# Patient Record
Sex: Female | Born: 1935
Health system: Southern US, Community
[De-identification: ages and names within clinical notes are randomized; demographics above are authoritative.]

## PROBLEM LIST (undated history)

## (undated) DIAGNOSIS — I2510 Atherosclerotic heart disease of native coronary artery without angina pectoris: Secondary | ICD-10-CM

## (undated) DIAGNOSIS — F0280 Dementia in other diseases classified elsewhere without behavioral disturbance: Secondary | ICD-10-CM

## (undated) DIAGNOSIS — F0390 Unspecified dementia without behavioral disturbance: Secondary | ICD-10-CM

## (undated) DIAGNOSIS — I10 Essential (primary) hypertension: Secondary | ICD-10-CM

## (undated) DIAGNOSIS — E785 Hyperlipidemia, unspecified: Secondary | ICD-10-CM

## (undated) DIAGNOSIS — I4891 Unspecified atrial fibrillation: Secondary | ICD-10-CM

## (undated) HISTORY — DX: Essential (primary) hypertension: I10

---

## 2002-02-10 ENCOUNTER — Inpatient Hospital Stay (HOSPITAL_COMMUNITY): Admission: EM | Admit: 2002-02-10 | Discharge: 2002-02-13 | Payer: Self-pay | Admitting: *Deleted

## 2002-02-10 ENCOUNTER — Encounter: Payer: Self-pay | Admitting: *Deleted

## 2003-06-24 ENCOUNTER — Ambulatory Visit (HOSPITAL_COMMUNITY): Admission: RE | Admit: 2003-06-24 | Discharge: 2003-06-24 | Payer: Self-pay | Admitting: *Deleted

## 2007-05-03 ENCOUNTER — Ambulatory Visit (HOSPITAL_COMMUNITY): Admission: RE | Admit: 2007-05-03 | Discharge: 2007-05-03 | Payer: Self-pay | Admitting: Pulmonary Disease

## 2008-08-10 ENCOUNTER — Encounter: Payer: Self-pay | Admitting: Cardiovascular Disease

## 2008-08-10 ENCOUNTER — Ambulatory Visit (HOSPITAL_COMMUNITY): Admission: RE | Admit: 2008-08-10 | Discharge: 2008-08-10 | Payer: Self-pay | Admitting: Cardiovascular Disease

## 2010-12-20 NOTE — Procedures (Signed)
NAMEKATHYANN, Amy Randall             ACCOUNT NO.:  [id number]   MEDICAL RECORD NO.:  [id number]          PATIENT TYPE:  OUT   LOCATION:  RAD                           FACILITY:  [facility]   PHYSICIAN:  Darlin Priestly, MD  DATE OF BIRTH:  1934-05-01   DATE OF PROCEDURE:  05/03/2007  DATE OF DISCHARGE:                                ECHOCARDIOGRAM   Amy Randall is a 77 year old female patient of [place], Dr. Kem Boroughs, with a history of palpitations and hypertension, is now  referred for a 2-D echocardiogram, evaluate LV function and valvular  structures.   The aorta is within normal limits at 3.2 cm.   Left atrium is enlarged 4.5 cm.  There were no clots seen.  The patient  in sinus rhythm during the procedure.   IVUS and MAPW are mildly thickened at 1.2 and 1.8 cm respectively.   The aortic valve is mildly thickened with no significant aortic stenosis  or regurgitation.   Mitral valve leaflets are mildly thickened with mild to moderate mitral  regurgitation.  There is no significant mitral stenosis.   Structurally normal tricuspid valve with mild tricuspid regurg.   Trivial pulmonic regurgitation.   Left ventricular internal dimensions within normal limits at 4.2 and 3.0  cm respectfully.  There is good overall left ventricular function,  estimated EF of 60% with no segmental wall motion abnormalities  visualized.   Normal RV size and systolic function.   Mild right atrial enlargement.   CONCLUSIONS:  1. Normal LV size and systolic function, estimated EF of 60%.  2. Mildly thickened aortic valve with no significant aortic stenosis      or regurgitation.  3. Mildly thickened mitral valve leaflets with mild to moderate mitral      regurg and no significant mitral stenosis.  4. Structurally normal tricuspid valve with mild tricuspid      regurgitation.  5. Trivial pulmonic regurgitation.  6. Biatrial enlargement.  7. Normal RV size and systolic function.      Darlin Priestly, MD  Electronically Signed     RHM/MEDQ  D:  05/03/2007  T:  05/04/2007  Job:  [id number]   cc:   Ramon Dredge L. Juanetta Gosling, M.D.  Fax: [phone]   Dani Gobble, MD  Fax: [phone]

## 2010-12-23 NOTE — H&P (Signed)
[facility]  Patient:    Amy Randall, Amy Randall Visit Number: [id number] MRN: [id number]          Service Type: MED Location: 2A A210 01 Attending Physician:  Marcene Corning Dictated by:   Marcene Corning, M.D. Admit Date:  02/10/2002                           History and Physical  ATTENDING PHYSICIAN NOT KNOWN.  CHIEF COMPLAINT:  "I woke up this morning with rapid heart rate and I didnt feel well."  HISTORY OF PRESENT ILLNESS:  The patient is a 77 year old white female with no significant past medical history.  She is not on any medications at this time and does not have any primary care M.D.  The patient states she awoke this morning with rapid heart rate.  She states she just "didnt feel well and could feel her heart racing."  She was brought to the emergency room for evaluation.  She denies any shortness of breath. She denies any chest pain, tightness, or pressure.  There is no dizziness or near syncope.  In the ER, the patient was noted to be in rapid atrial fibrillation with heart rate in the 160s initially.  She was also noted to be hypertensive with systolic pressures in the 180s and diastolics in the low 100s.  She was treated initially with adenosine which did not work.  She was then placed on Cardizem bolus and drip.  She had subsequently converted to a normal sinus rhythm at a rate of around 100-110.  She was noted to have a profoundly low potassium at 2.7 and with this elevated blood pressure this is worrisome for possible primary aldosteronism as she is not on any diuretics.  Because of her above symptoms and laboratory abnormalities and rapid atrial fibrillation it was felt that admission was indicated.  REVIEW OF SYSTEMS:  The patient has headaches occasionally at home controlled with over-the-counter medications.  No recent change in her vision or hearing. No sore mouth or sore throat.  No complaints of any chest pain, tightness, or  pressure.  No shortness of breath or wheezing.  No abdominal pain.  She has occasional constipation, no diarrhea, no UTI symptoms, no hematuria, dysuria, melena or bright red blood per rectum.  No complaints of pain in her extremities or joints.  No complaints of lower extremity edema.  No fever, chills, nausea, vomiting, or weight loss.  The remainder of her review of systems is negative.  PAST MEDICAL HISTORY:  None.  PAST SURGICAL HISTORY:  None.  ALLERGIES:  QUESTIONABLE PYRIDIUM.  MEDICATIONS:  None.  FAMILY HISTORY:  Positive for mother with rheumatic fever and mitral valve prolapse.  No cancer.  Father had stroke.  Her grandmother and aunt had diabetes.  SOCIAL HISTORY:  The patient is married, she has two children, she does not work.  She has no history of cigarette use/abuse or alcohol abuse or other drug abuse.  PHYSICAL EXAMINATION:  VITAL SIGNS:  In the emergency room, she had vitals that showed systolic blood pressures in the 180s-190s and diastolic blood pressures in the 90s to low 100s.  She was afebrile, her heart rate was in the 160s, respirations were 20.  GENERAL:  She was an elderly-appearing white female lying at 45 degrees in no apparent distress.  HEENT:  Nontraumatic, normocephalic.  Pupils equal, round, reactive to light. Conjunctivae are clear.  Sclerae are nonicteric.  Oropharynx is  clear.  Mucous membranes are moist.  NECK:  Supple.  No lymphadenopathy, no thyromegaly, no JVD, no bruits.  LUNGS:  Clear to auscultation bilaterally.  HEART:  Regular rate and rhythm with a 1-2/6 systolic murmur.  ABDOMEN:  Soft and nontender, nondistended, positive bowel sounds.  No masses.  GENITAL:  Not done.  EXTREMITIES:  Full range of motion with no significant edema.  NEUROLOGIC:  She was alert and oriented x3.  Her cranial nerves II-XII were grossly intact.  Sensory and motor was grossly intact in the upper and lower extremities bilaterally with no  asymmetry noted.  Cerebellar exam was not tested.  LABORATORIES:  Her PT and PTT were normal.  CPK was 33 with a troponin 0.04. CBC showed a white blood cell count of 12.2, hemoglobin 14.2, platelets of 245,000.  Chest x-ray showed cardiomegaly with questionable early versus mild failure. There was no effusions or other infiltrates.  Chemistry panel showed potassium low at 2.7, sodium 137, chloride 103, bicarb 24, blood sugar elevated at 168, BUN 9, creatinine 0.7.  Her alkaline phosphatase is 128, her total bilirubin is slightly elevated at 1.3.  Her AST was 45, ALT was 55.  The patients EKG initially showed rapid atrial fibrillation with ST depression in leads V3-V6 up to 2-3 mm.  She also had some ST depression and T wave inversion in her inferior leads.  She had decrease in her anterior R forces in V1 and V2.  EKG normal sinus rhythm shows resolution of the ST changes but continued T wave flattening and inversion in her inferior leads. She still has the decreased anterior R forces in V1 and V2.  PROBLEM LIST: 1. Rapid atrial fibrillation which has now converted to normal sinus rhythm on    Cardizem.  EKG showing what looks like ischemic changes inferiorly and    laterally and septal leads and lateral leads.  Cardiac enzymes normal at    this time. 2. Hypertension with blood pressure very elevated in the emergency room as    noted above now improved on Cardizem drip. 3. Leukocytosis.  White cell count very slightly elevated at 12.2 but no    evidence clinically of infection. 4. Questionable congestive heart failure on chest x-ray.  Saturations in the    mid to high 90s now.  She does have what looks like cardiomegaly on chest    x-ray per radiology. 5. Hypokalemia with potassium of 2.7 and with this blood pressure and no    diuretic use this may be worrisome for primary hyperaldosteronism. 6. A 1-2/6 systolic murmur. 7. Hyperglycemia with a blood sugar 168.  No history of  diabetes. 8. Slightly elevated liver function tests questionable secondary to decreased     hepatic blood flow during her episode of rapid atrial fibrillation.  PLAN:  I will admit her to the ICU and try to wean her off this Cardizem drip. I will treat her with Lopressor 50 mg p.o. q.8h. and use p.r.n. clonidine as needed.  I will check a TSH level for her atrial fibrillation and check a magnesium level for this low potassium and replace that if needed.  I will replace her potassium p.o. for now.  She will need a 2-D echocardiogram to assess her wall motion ejection fraction and valve with this 1-2/6 systolic murmur.  She will need a nuclear medicine stress test before discharge and I will go ahead and schedule that for tomorrow.  Would ask cardiology to see her today.  I  will place her low salt diet and replace her potassium p.o.  I will consider workup for primary hyperaldosteronism either as an inpatient or an outpatient before discharge.  I would consider abdominal CT to look at her adrenal glands and consider renin levels and aldosterone levels.  I will recheck her liver function tests in the morning to see if they are improved and if not will consider hepatitis screens and GI consult.  For her hyperglycemia, I will do Accu-Cheks and consider a hemoglobin A1C if they are consistently elevated and workup for diabetes. Dictated by:   Marcene Corning, M.D. Attending Physician:  Marcene Corning DD:  02/10/02 TD:  02/12/02 Job: [id number]

## 2010-12-23 NOTE — Procedures (Signed)
[facility]  Patient:    Amy Randall, WIDEMAN Visit Number: [id number] MRN: [id number]          Service Type: MED Location: 2A A210 01 Attending Physician:  Marcene Corning Dictated by:   [name], M.D. Proc. Date: 02/10/02 Admit Date:  02/10/2002 Discharge Date: 02/13/2002                            EKG Interpretations  TIME/DATE:  2841, February 10, 2002  DESCRIPTION:  The rhythm is what appears to be a supraventricular tachycardia, probably atrial fibrillation, with a ventricular response around 160-170. There are ST and T-wave changes which may be due to ischemia or may be related to the rate.  There is evidence of left ventricular hypertrophy.  IMPRESSION:  Abnormal electrocardiogram. Dictated by:   [name], M.D. Attending Physician:  Marcene Corning DD:  02/20/02 TD:  02/26/02 Job: [id number]

## 2010-12-23 NOTE — Discharge Summary (Signed)
[facility]  Patient:    Amy Randall, Amy Randall Visit Number: [id number] MRN: [id number]          Service Type: MED Location: 2A A210 01 Attending Physician:  Marcene Corning Dictated by:   Marcene Corning, M.D. Admit Date:  02/10/2002 Discharge Date: 02/13/2002   CC:         [name], M.D.  Sherral Hammers, M.D.   Discharge Summary  CONSULTATIONS THIS ADMISSION:  [facility] with Dr. Domingo Sep and colleagues.  DISCHARGE DIAGNOSES: 1. Rapid atrial fibrillation, converted to normal sinus rhythm at this time,    discharged on an 81 mg aspirin per day. Work-up included TSH and    echocardiogram was normal.  For follow up stress test as an outpatient. 2. Hypertension with blood pressure severely elevated on admission with    systolics greater than 200, and diastolics above 100.  Now much improved on    medications as noted below.  Discharged on a low salt diet and medications    as noted below. 3. Mild CHF, noted on admission, probably secondary to atrial fibrillation    with 2-D echocardiogram showing normal left ventricular and right    ventricular function and no significant valvular abnormalities. 4. Hypokalemia, which is severe on admission at 2.7.  With her elevated blood    pressures and hypokalemia on no diuretics, work-up for primary aldosterone    was done with renin levels which should be back next week.  Her urine    sodium was 22, her urine potassium 22. 5. Anxiety, well-controlled, on Paxil 20 mg per day p.r.n. and Xanax 0.25 mg    q.8h. 6. Hypercholesterolemia.  Discharged on Zocor. 7. Mild pleural effusions resolved by the day of discharge.  Her room air    saturations have been in the high 90s. 8. Mild elevation of liver function tests on admission, resolved during this    hospitalization by the next day, possibly secondary to decreased hepatic    blood flow during the atrial fibrillation.  DISCHARGE MEDICATIONS: 1.  Cardizem CD 300 mg p.o. q. day. 2. Lopressor 50 mg every 8 hours.  I have asked her to take them at 7 a.m., 3    p.m. and 11 p.m. 3. 81 mg enteric-coated aspirin per day with food. 4. Altace 5 mg per day. 5. Paxil 20 mg per day. 6. Zocor 40 mg per day. 7. Xanax 0.25 mg every 8 hours as needed.  FOLLOWUP:  She will follow up with Dr. Domingo Sep as directed and they will call her office tomorrow for an appointment.  She will follow up with Dr. Juanetta Gosling as directed and he will become her primary care physician.  His office is to call her tomorrow for an appointment.  DIET:  Low salt, 2-3 gram sodium.  ACTIVITY:  As tolerated.  FOLLOWUP:  She is to return to the emergency room or call her cardiologists office for episodes of rapid heart beat or shortness of breath, dizziness, or chest pain.  HISTORY AND PHYSICAL:  Please refer to the dictated history and physical.  LABORATORY DATA AND X-RAYS:  The patient had a 2-D echocardiogram which showed mild tricuspid regurgitation but the rest of her valves were normal and her ejection fraction was normal.  Her magnesium level was 2.0 during this hospitalization.  Her last chemistry panel showed a sodium 141, potassium 3.8, chloride 105, bicarbonate 28.  Her BUN was 20, creatinine 1.0, calcium normal at 9.0.  Admission white cell count  was 12.2, but repeat was 9.3.  Her hemoglobin was 12.0 last check and her platelets were 213,000 last check.  Her PT and PTT were normal on admission.  Liver function tests was slightly elevated on admission with an AST of 45, ALT 55, alk. phos. 128, and total bilirubin 1.3.  On repeat check they were all normal with AST of 23, ALT 36, alk. phos. 101, and total bilirubin 0.6.   Her hemoglobin A1c was 4.9 and normal.  She ruled out for myocardial infarction by enzymes.  Lipid profile showed a total cholesterol of 253, with a LDL of 153, and a HDL of 74. Triglycerides were 132, and normal.  Urine sodium was 22, urine  potassium 22, TSH was 1.48.  Chest x-ray showed mild CHF on admission and cardiomegaly.  EKG on admission showed rapid atrial fibrillation with ST depression in her anterior and lateral leads.  She also had some mild ST depression in her inferior leads.  Repeat EKG showed normal sinus rhythm and resolution of her ST depressions.  She still had T wave inversions in 3 and aVF and V3.  HOSPITAL COURSE: #1 - ATRIAL FIBRILLATION:  On admission the patient was in rapid atrial fibrillation but converted with Cardizem IV.  She was admitted to the ICU and we were able to wean her Cardizem drip to p.o. Cardizem.  We also added beta blocker as noted above.  Her heart rain remained in sinus rhythm throughout the remainder of the hospitalization.  Cardiology consultation was obtained a 2-D echocardiogram showed only minimal tricuspid regurgitation but no other significant valvular abnormalities and her ejection fraction was normal.  We felt that she needed stress testing to rule out coronary artery disease as an etiology and this will be done as outpatient via Dr. Ledora Bottcher office.  Her TSH was normal during this hospitalization.  #2 - HYPERTENSION:  Her blood pressure was very elevated in the emergency room with systolics at times above 200s and diastolic greater than 100.  It was improved on a Cardizem drip and we were able to change her to Cardizem CD 300 mg per day along with Altace 500 mg per day and Lopressor 50 mg every 8 hours. We may be able to change her beta blocker to a once a day dose as an outpatient but I will leave that up to cardiology.  On this regimen  her systolics were ranging between 140 and 170 with diastolics less than 100.  I have explained to her that we cannot drop her pressure to low, to quick, and her medications can be adjusted as an outpatient.  I went over a low salt diet with her and her daughter.   #3 - ANXIETY:  This is well controlled on Paxil and Xanax and  she will be discharged on that as an outpatient.  #4 - LEUKOCYTOSIS:  I saw no evidence of any infection during this hospitalization.  She was not discharged on any antibiotics.  She did receive 1 dose of antibiotic Levaquin during this hospitalization but that quickly discontinued.  #5 - CONGESTIVE HEART FAILURE:  This was noted on admission and in retrospect it was probably secondary to altered rapid atrial fibrillation as her 2-D echocardiogram showed normal left ventricular and right ventricular functions.  #6 - HYPOKALEMIA:  This was noted on admission at 2.7 and she is not on any diuretics.  With this very high blood pressure I sent a renin level to rule out any primary hyperaldosteronism.  Her  urine lytes were as noted above. This can be followed up as outpatient via Dr. Juanetta Gosling office.  #7 - HYPERGLYCEMIA:  Her blood sugar on admission was 168 but Accu-Cheks during this hospitalization were normal and her hemoglobin A1c was normal and I felt this was probably secondary to stress related to her atrial fibrillation.  No further work-up was indicated at this time.  #8 - SLIGHTLY ELEVATED LIVER FUNCTION TESTS:  Again, on admission these were elevated but returned to normal by the next day.  I felt this was probably secondary to slightly decreased hepatic blood flow during her episode of rapid atrial fibrillation and did not feel any further work-up was indicated at this time. Dictated by:   Marcene Corning, M.D. Attending Physician:  Marcene Corning DD:  02/13/02 TD:  02/17/02 Job: [id number]

## 2010-12-23 NOTE — Procedures (Signed)
[facility]  Patient:    Amy Randall, Amy Randall Visit Number: [id number] MRN: [id number]          Service Type: MED Location: 2A A210 01 Attending Physician:  Marcene Corning Dictated by:   [name], M.D. Proc. Date: 02/10/02 Admit Date:  02/10/2002 Discharge Date: 02/13/2002                            EKG Interpretations  TIME/DATE:  1144, February 10, 2002  DESCRIPTION:  The rhythm is a sinus tachycardia with a rate about 105.  The previously noted ST and T-wave changes are much less marked, and her left ventricular hypertrophy is less marked than before.  IMPRESSION:  Abnormal electrocardiogram. Dictated by:   [name], M.D. Attending Physician:  Marcene Corning DD:  02/20/02 TD:  02/26/02 Job: [id number]

## 2012-01-10 DIAGNOSIS — I10 Essential (primary) hypertension: Secondary | ICD-10-CM | POA: Diagnosis not present

## 2012-01-10 DIAGNOSIS — I951 Orthostatic hypotension: Secondary | ICD-10-CM | POA: Diagnosis not present

## 2012-01-10 DIAGNOSIS — E785 Hyperlipidemia, unspecified: Secondary | ICD-10-CM | POA: Diagnosis not present

## 2012-04-03 DIAGNOSIS — I4891 Unspecified atrial fibrillation: Secondary | ICD-10-CM | POA: Diagnosis not present

## 2012-04-03 DIAGNOSIS — I10 Essential (primary) hypertension: Secondary | ICD-10-CM | POA: Diagnosis not present

## 2012-04-29 DIAGNOSIS — R21 Rash and other nonspecific skin eruption: Secondary | ICD-10-CM | POA: Diagnosis not present

## 2012-07-10 DIAGNOSIS — I10 Essential (primary) hypertension: Secondary | ICD-10-CM | POA: Diagnosis not present

## 2012-07-10 DIAGNOSIS — E785 Hyperlipidemia, unspecified: Secondary | ICD-10-CM | POA: Diagnosis not present

## 2012-07-10 DIAGNOSIS — M1990 Unspecified osteoarthritis, unspecified site: Secondary | ICD-10-CM | POA: Diagnosis not present

## 2012-07-10 DIAGNOSIS — Z23 Encounter for immunization: Secondary | ICD-10-CM | POA: Diagnosis not present

## 2012-07-22 DIAGNOSIS — I10 Essential (primary) hypertension: Secondary | ICD-10-CM | POA: Diagnosis not present

## 2012-07-22 DIAGNOSIS — E785 Hyperlipidemia, unspecified: Secondary | ICD-10-CM | POA: Diagnosis not present

## 2013-01-01 DIAGNOSIS — I10 Essential (primary) hypertension: Secondary | ICD-10-CM | POA: Diagnosis not present

## 2013-01-01 DIAGNOSIS — E785 Hyperlipidemia, unspecified: Secondary | ICD-10-CM | POA: Diagnosis not present

## 2013-01-01 DIAGNOSIS — M1990 Unspecified osteoarthritis, unspecified site: Secondary | ICD-10-CM | POA: Diagnosis not present

## 2013-05-23 DIAGNOSIS — Z23 Encounter for immunization: Secondary | ICD-10-CM | POA: Diagnosis not present

## 2013-08-13 ENCOUNTER — Other Ambulatory Visit: Payer: Self-pay | Admitting: *Deleted

## 2013-08-15 ENCOUNTER — Other Ambulatory Visit: Payer: Self-pay | Admitting: *Deleted

## 2013-08-15 MED ORDER — METOPROLOL TARTRATE 25 MG PO TABS
12.5000 mg | ORAL_TABLET | Freq: Two times a day (BID) | ORAL | Status: DC
Start: 2013-08-15 — End: 2013-10-14

## 2013-08-15 NOTE — Telephone Encounter (Signed)
Rx was sent to pharmacy electronically. 

## 2013-08-19 ENCOUNTER — Ambulatory Visit: Payer: Self-pay | Admitting: Cardiovascular Disease

## 2013-09-01 DIAGNOSIS — I259 Chronic ischemic heart disease, unspecified: Secondary | ICD-10-CM | POA: Diagnosis not present

## 2013-09-01 DIAGNOSIS — I10 Essential (primary) hypertension: Secondary | ICD-10-CM | POA: Diagnosis not present

## 2013-09-01 DIAGNOSIS — E785 Hyperlipidemia, unspecified: Secondary | ICD-10-CM | POA: Diagnosis not present

## 2013-09-01 DIAGNOSIS — M1990 Unspecified osteoarthritis, unspecified site: Secondary | ICD-10-CM | POA: Diagnosis not present

## 2013-09-10 ENCOUNTER — Ambulatory Visit: Payer: Self-pay | Admitting: Cardiovascular Disease

## 2013-10-02 ENCOUNTER — Ambulatory Visit: Payer: Self-pay | Admitting: Cardiovascular Disease

## 2013-10-07 ENCOUNTER — Other Ambulatory Visit: Payer: Self-pay

## 2013-10-07 MED ORDER — DILTIAZEM HCL ER COATED BEADS 300 MG PO CP24: 300.0000 mg | ORAL_CAPSULE | Freq: Every day | ORAL | Status: DC

## 2013-10-07 NOTE — Telephone Encounter (Signed)
Rx was sent to pharmacy electronically. 

## 2013-10-14 ENCOUNTER — Other Ambulatory Visit: Payer: Self-pay

## 2013-10-14 MED ORDER — METOPROLOL TARTRATE 25 MG PO TABS: 12.5000 mg | ORAL_TABLET | Freq: Two times a day (BID) | ORAL | Status: DC

## 2013-10-14 NOTE — Telephone Encounter (Signed)
Rx was sent to pharmacy electronically. 

## 2013-11-06 ENCOUNTER — Other Ambulatory Visit: Payer: Self-pay

## 2013-11-06 MED ORDER — LISINOPRIL 20 MG PO TABS: 20.0000 mg | ORAL_TABLET | Freq: Every day | ORAL | Status: DC

## 2013-11-06 NOTE — Telephone Encounter (Signed)
Patient hasn't been seen since 03/2012 and has canceled several scheduled appointments. A 30-day supply was sent into pharmacy. Patient MUST/KEEP an appointment for future refills.

## 2013-12-08 DIAGNOSIS — I10 Essential (primary) hypertension: Secondary | ICD-10-CM | POA: Diagnosis not present

## 2013-12-08 DIAGNOSIS — I259 Chronic ischemic heart disease, unspecified: Secondary | ICD-10-CM | POA: Diagnosis not present

## 2013-12-08 DIAGNOSIS — M1990 Unspecified osteoarthritis, unspecified site: Secondary | ICD-10-CM | POA: Diagnosis not present

## 2013-12-08 DIAGNOSIS — E785 Hyperlipidemia, unspecified: Secondary | ICD-10-CM | POA: Diagnosis not present

## 2013-12-10 ENCOUNTER — Other Ambulatory Visit: Payer: Self-pay | Admitting: Cardiovascular Disease

## 2013-12-10 NOTE — Telephone Encounter (Signed)
Rx was sent to pharmacy electronically. PATIENT NEEDS TO CALL AND SCHEDULE AN APPOINTMENT FOR FUTURE REFILLS.

## 2013-12-11 DIAGNOSIS — F329 Major depressive disorder, single episode, unspecified: Secondary | ICD-10-CM | POA: Diagnosis not present

## 2013-12-11 DIAGNOSIS — F3289 Other specified depressive episodes: Secondary | ICD-10-CM | POA: Diagnosis not present

## 2013-12-11 DIAGNOSIS — E785 Hyperlipidemia, unspecified: Secondary | ICD-10-CM | POA: Diagnosis not present

## 2013-12-11 DIAGNOSIS — M1990 Unspecified osteoarthritis, unspecified site: Secondary | ICD-10-CM | POA: Diagnosis not present

## 2013-12-11 DIAGNOSIS — I10 Essential (primary) hypertension: Secondary | ICD-10-CM | POA: Diagnosis not present

## 2014-02-25 ENCOUNTER — Other Ambulatory Visit: Payer: Self-pay | Admitting: Cardiovascular Disease

## 2014-02-25 NOTE — Telephone Encounter (Signed)
Patient not seen since 2013, spoke with patient about needing appointment for further refills. Patient stated she was going to get all medications from PCP and did not need to come back here.

## 2014-02-25 NOTE — Telephone Encounter (Signed)
Rx refill denied to patient pharmacy   

## 2014-06-11 DIAGNOSIS — I10 Essential (primary) hypertension: Secondary | ICD-10-CM | POA: Diagnosis not present

## 2014-06-11 DIAGNOSIS — E785 Hyperlipidemia, unspecified: Secondary | ICD-10-CM | POA: Diagnosis not present

## 2014-06-11 DIAGNOSIS — I25810 Atherosclerosis of coronary artery bypass graft(s) without angina pectoris: Secondary | ICD-10-CM | POA: Diagnosis not present

## 2014-06-11 DIAGNOSIS — Z23 Encounter for immunization: Secondary | ICD-10-CM | POA: Diagnosis not present

## 2014-06-17 DIAGNOSIS — Z79899 Other long term (current) drug therapy: Secondary | ICD-10-CM | POA: Diagnosis not present

## 2014-06-17 DIAGNOSIS — E785 Hyperlipidemia, unspecified: Secondary | ICD-10-CM | POA: Diagnosis not present

## 2014-12-09 DIAGNOSIS — E785 Hyperlipidemia, unspecified: Secondary | ICD-10-CM | POA: Diagnosis not present

## 2014-12-09 DIAGNOSIS — I10 Essential (primary) hypertension: Secondary | ICD-10-CM | POA: Diagnosis not present

## 2014-12-09 DIAGNOSIS — F419 Anxiety disorder, unspecified: Secondary | ICD-10-CM | POA: Diagnosis not present

## 2014-12-09 DIAGNOSIS — H9191 Unspecified hearing loss, right ear: Secondary | ICD-10-CM | POA: Diagnosis not present

## 2015-01-12 ENCOUNTER — Other Ambulatory Visit: Payer: Self-pay | Admitting: Cardiovascular Disease

## 2015-06-10 DIAGNOSIS — I10 Essential (primary) hypertension: Secondary | ICD-10-CM | POA: Diagnosis not present

## 2015-06-10 DIAGNOSIS — M1990 Unspecified osteoarthritis, unspecified site: Secondary | ICD-10-CM | POA: Diagnosis not present

## 2015-06-10 DIAGNOSIS — F419 Anxiety disorder, unspecified: Secondary | ICD-10-CM | POA: Diagnosis not present

## 2015-06-10 DIAGNOSIS — I2510 Atherosclerotic heart disease of native coronary artery without angina pectoris: Secondary | ICD-10-CM | POA: Diagnosis not present

## 2015-06-10 DIAGNOSIS — Z23 Encounter for immunization: Secondary | ICD-10-CM | POA: Diagnosis not present

## 2015-12-08 DIAGNOSIS — I10 Essential (primary) hypertension: Secondary | ICD-10-CM | POA: Diagnosis not present

## 2015-12-08 DIAGNOSIS — I25810 Atherosclerosis of coronary artery bypass graft(s) without angina pectoris: Secondary | ICD-10-CM | POA: Diagnosis not present

## 2015-12-08 DIAGNOSIS — E785 Hyperlipidemia, unspecified: Secondary | ICD-10-CM | POA: Diagnosis not present

## 2015-12-08 DIAGNOSIS — M1990 Unspecified osteoarthritis, unspecified site: Secondary | ICD-10-CM | POA: Diagnosis not present

## 2015-12-13 DIAGNOSIS — E785 Hyperlipidemia, unspecified: Secondary | ICD-10-CM | POA: Diagnosis not present

## 2015-12-13 DIAGNOSIS — M1990 Unspecified osteoarthritis, unspecified site: Secondary | ICD-10-CM | POA: Diagnosis not present

## 2015-12-13 DIAGNOSIS — I25810 Atherosclerosis of coronary artery bypass graft(s) without angina pectoris: Secondary | ICD-10-CM | POA: Diagnosis not present

## 2015-12-13 DIAGNOSIS — I10 Essential (primary) hypertension: Secondary | ICD-10-CM | POA: Diagnosis not present

## 2016-04-20 DIAGNOSIS — W19XXXA Unspecified fall, initial encounter: Secondary | ICD-10-CM | POA: Diagnosis not present

## 2016-04-20 DIAGNOSIS — I10 Essential (primary) hypertension: Secondary | ICD-10-CM | POA: Diagnosis not present

## 2016-07-09 DIAGNOSIS — L03113 Cellulitis of right upper limb: Secondary | ICD-10-CM | POA: Diagnosis not present

## 2016-07-28 DIAGNOSIS — L0390 Cellulitis, unspecified: Secondary | ICD-10-CM | POA: Diagnosis not present

## 2016-07-28 DIAGNOSIS — I10 Essential (primary) hypertension: Secondary | ICD-10-CM | POA: Diagnosis not present

## 2016-07-28 DIAGNOSIS — I25810 Atherosclerosis of coronary artery bypass graft(s) without angina pectoris: Secondary | ICD-10-CM | POA: Diagnosis not present

## 2016-07-28 DIAGNOSIS — F419 Anxiety disorder, unspecified: Secondary | ICD-10-CM | POA: Diagnosis not present

## 2016-09-05 DIAGNOSIS — Z23 Encounter for immunization: Secondary | ICD-10-CM | POA: Diagnosis not present

## 2016-12-01 DIAGNOSIS — L0390 Cellulitis, unspecified: Secondary | ICD-10-CM | POA: Diagnosis not present

## 2017-01-02 DIAGNOSIS — I10 Essential (primary) hypertension: Secondary | ICD-10-CM | POA: Diagnosis not present

## 2017-01-02 DIAGNOSIS — I25810 Atherosclerosis of coronary artery bypass graft(s) without angina pectoris: Secondary | ICD-10-CM | POA: Diagnosis not present

## 2017-01-02 DIAGNOSIS — F419 Anxiety disorder, unspecified: Secondary | ICD-10-CM | POA: Diagnosis not present

## 2017-01-02 DIAGNOSIS — G309 Alzheimer's disease, unspecified: Secondary | ICD-10-CM | POA: Diagnosis not present

## 2017-01-31 DIAGNOSIS — G309 Alzheimer's disease, unspecified: Secondary | ICD-10-CM | POA: Diagnosis not present

## 2017-01-31 DIAGNOSIS — I25810 Atherosclerosis of coronary artery bypass graft(s) without angina pectoris: Secondary | ICD-10-CM | POA: Diagnosis not present

## 2017-01-31 DIAGNOSIS — I10 Essential (primary) hypertension: Secondary | ICD-10-CM | POA: Diagnosis not present

## 2017-02-15 ENCOUNTER — Encounter: Payer: Self-pay | Admitting: Orthopaedic Surgery

## 2017-02-15 ENCOUNTER — Ambulatory Visit (INDEPENDENT_AMBULATORY_CARE_PROVIDER_SITE_OTHER): Payer: Medicare Other

## 2017-02-15 ENCOUNTER — Ambulatory Visit (INDEPENDENT_AMBULATORY_CARE_PROVIDER_SITE_OTHER): Payer: Medicare Other | Admitting: Orthopaedic Surgery

## 2017-02-15 ENCOUNTER — Ambulatory Visit: Payer: Medicare Other

## 2017-02-15 VITALS — BP 160/87 | HR 77 | Temp 96.9°F | Ht 59.0 in | Wt 138.0 lb

## 2017-02-15 DIAGNOSIS — M25561 Pain in right knee: Secondary | ICD-10-CM

## 2017-02-15 DIAGNOSIS — G8929 Other chronic pain: Secondary | ICD-10-CM | POA: Diagnosis not present

## 2017-02-15 DIAGNOSIS — M25562 Pain in left knee: Secondary | ICD-10-CM | POA: Diagnosis not present

## 2017-02-15 NOTE — Progress Notes (Signed)
Knee pain

## 2017-02-15 NOTE — Progress Notes (Signed)
Subjective:    Patient ID: Amy Randall, female    DOB: 07-21-1934, 83 y.o.   MRN: [id number]  HPI She has had bilateral knee pain for many months. She is followed by Dr. Juanetta GoslingHawkins and I have copies of his notes. She has no trauma. She has swelling and popping and pain.  She has no giving way. She uses a Ped[place].  She has tried rest, heat, ice, rubs and Advil with little help.  She has early dementia.   Review of Systems  HENT: Negative for congestion.   Respiratory: Negative for cough and shortness of breath.   Cardiovascular: Negative for chest pain and leg swelling.  Endocrine: Positive for cold intolerance.  Musculoskeletal: Positive for arthralgias, gait problem and joint swelling.  Allergic/Immunologic: Positive for environmental allergies.   Past Medical History:  Diagnosis Date  . Hypertension     History reviewed. No pertinent surgical history.  Current Outpatient Prescriptions on File Prior to Visit  Medication Sig Dispense Refill  . diltiazem (CARDIZEM CD) 300 MG 24 hr capsule Take 1 capsule (300 mg total) by mouth daily. 90 capsule 0  . lisinopril (PRINIVIL,ZESTRIL) 20 MG tablet Take 1 tablet (20 mg total) by mouth daily. 30 tablet 0  . metoprolol tartrate (LOPRESSOR) 25 MG tablet TAKE 1/2 TABLET TWICE DAILY (PATIENT NEEDS TO MAKE AN APPOINTMENT FOR FUTURE REFILLS) (Patient not taking: Reported on 02/15/2017) 15 tablet 0   No current facility-administered medications on file prior to visit.     Social History   Social History  . Marital status: Widowed    Spouse name: N/A  . Number of children: N/A  . Years of education: N/A   Occupational History  . Not on file.   Social History Main Topics  . Smoking status: Never Smoker  . Smokeless tobacco: Never Used  . Alcohol use Not on file  . Drug use: Unknown  . Sexual activity: Not on file   Other Topics Concern  . Not on file   Social History Narrative  . No narrative on file    Family History    Problem Relation Age of Onset  . Hypertension Father   . Stroke Father   . Diabetes Maternal Grandmother     BP (!) 160/87   Pulse 77   Temp (!) 96.9 F (36.1 C)   Ht 4\' 11"  (1.499 m)   Wt 138 lb (62.6 kg)   BMI 27.87 kg/m      Objective:   Physical Exam  Constitutional: She is oriented to person, place, and time. She appears well-developed and well-nourished.  HENT:  Head: Normocephalic and atraumatic.  Eyes: Pupils are equal, round, and reactive to light. Conjunctivae and EOM are normal.  Neck: Normal range of motion. Neck supple.  Cardiovascular: Normal rate, regular rhythm and intact distal pulses.   Pulmonary/Chest: Effort normal.  Abdominal: Soft.  Musculoskeletal: She exhibits tenderness (Both knees tender, ROM right 1-110, left 0-100 with more pain, crepitus bilaterally, NV intact, limp left, uses [place].).  Neurological: She is alert and oriented to person, place, and time. She displays normal reflexes. No cranial nerve deficit. She exhibits normal muscle tone. Coordination normal.  Skin: Skin is warm and dry.  Psychiatric: She has a normal mood and affect. Her behavior is normal. Judgment and thought content normal.  Vitals reviewed.  X-rays of both knees done, reported separately.      Assessment & Plan:   Encounter Diagnosis  Name Primary?  . Chronic pain  of both knees Yes   PROCEDURE NOTE:  The patient requests injections of the left knee , verbal consent was obtained.  The left knee was prepped appropriately after time out was performed.   Sterile technique was observed and injection of 1 cc of Depo-Medrol 40 mg with several cc's of plain xylocaine. Anesthesia was provided by ethyl chloride and a 20-gauge needle was used to inject the knee area. The injection was tolerated well.  A band aid dressing was applied.  The patient was advised to apply ice later today and tomorrow to the injection sight as needed.  PROCEDURE NOTE:  The patient requests  injections of the right knee , verbal consent was obtained.  The right knee was prepped appropriately after time out was performed.   Sterile technique was observed and injection of 1 cc of Depo-Medrol 40 mg with several cc's of plain xylocaine. Anesthesia was provided by ethyl chloride and a 20-gauge needle was used to inject the knee area. The injection was tolerated well.  A band aid dressing was applied.  The patient was advised to apply ice later today and tomorrow to the injection sight as needed.  Call if any problem.  Precautions discussed.   Return in one month.  Electronically Signed Darreld Mclean, MD 7/12/20182:41 PM

## 2017-02-26 ENCOUNTER — Other Ambulatory Visit: Payer: Self-pay

## 2017-03-15 ENCOUNTER — Ambulatory Visit: Payer: Medicare Other | Admitting: Orthopaedic Surgery

## 2017-03-21 ENCOUNTER — Encounter: Payer: Self-pay | Admitting: Orthopaedic Surgery

## 2017-03-21 ENCOUNTER — Ambulatory Visit (INDEPENDENT_AMBULATORY_CARE_PROVIDER_SITE_OTHER): Payer: Medicare Other | Admitting: Orthopaedic Surgery

## 2017-03-21 VITALS — BP 143/92 | HR 64 | Temp 97.2°F | Ht 59.0 in | Wt 135.0 lb

## 2017-03-21 DIAGNOSIS — M25562 Pain in left knee: Secondary | ICD-10-CM | POA: Diagnosis not present

## 2017-03-21 DIAGNOSIS — G8929 Other chronic pain: Secondary | ICD-10-CM | POA: Diagnosis not present

## 2017-03-21 DIAGNOSIS — M25561 Pain in right knee: Secondary | ICD-10-CM

## 2017-03-21 NOTE — Progress Notes (Signed)
CC: Both of my knees are hurting. I would like an injection in both knees.  The patient has had chronic pain and tenderness of both knees for some time.  Injections help.  There is no locking or giving way of the knee.  There is no new trauma. There is no redness or signs of infections.  The knees have a mild effusion and some crepitus.  There is no redness or signs of recent trauma.  Right knee ROM is 0-95 and left knee ROM is 0-105.  Impression:  Chronic pain of the both knees  Return:  1 month  PROCEDURE NOTE:  The patient requests injections of both knees, verbal consent was obtained.  The left and right knee were individually prepped appropriately after time out was performed.   Sterile technique was observed and injection of 1 cc of Depo-Medrol 40 mg with several cc's of plain xylocaine. Anesthesia was provided by ethyl chloride and a 20-gauge needle was used to inject each knee area. The injections were tolerated well.  A band aid dressing was applied.  The patient was advised to apply ice later today and tomorrow to the injection sight as needed.  Call if any problem.  Precautions discussed.   Electronically Signed Darreld McleanWayne Gilverto Dileonardo, MD 8/15/20182:11 PM

## 2017-04-18 ENCOUNTER — Ambulatory Visit: Payer: Self-pay | Admitting: Orthopaedic Surgery

## 2017-05-09 ENCOUNTER — Ambulatory Visit: Payer: Self-pay | Admitting: Orthopaedic Surgery

## 2017-06-25 DIAGNOSIS — Z23 Encounter for immunization: Secondary | ICD-10-CM | POA: Diagnosis not present

## 2017-06-25 DIAGNOSIS — I10 Essential (primary) hypertension: Secondary | ICD-10-CM | POA: Diagnosis not present

## 2017-10-23 DIAGNOSIS — I10 Essential (primary) hypertension: Secondary | ICD-10-CM | POA: Diagnosis not present

## 2017-10-23 DIAGNOSIS — F419 Anxiety disorder, unspecified: Secondary | ICD-10-CM | POA: Diagnosis not present

## 2017-10-23 DIAGNOSIS — I25810 Atherosclerosis of coronary artery bypass graft(s) without angina pectoris: Secondary | ICD-10-CM | POA: Diagnosis not present

## 2017-10-23 DIAGNOSIS — G309 Alzheimer's disease, unspecified: Secondary | ICD-10-CM | POA: Diagnosis not present

## 2018-01-21 DIAGNOSIS — I10 Essential (primary) hypertension: Secondary | ICD-10-CM | POA: Diagnosis not present

## 2018-01-21 DIAGNOSIS — E785 Hyperlipidemia, unspecified: Secondary | ICD-10-CM | POA: Diagnosis not present

## 2018-01-21 DIAGNOSIS — I2510 Atherosclerotic heart disease of native coronary artery without angina pectoris: Secondary | ICD-10-CM | POA: Diagnosis not present

## 2018-01-21 DIAGNOSIS — I25810 Atherosclerosis of coronary artery bypass graft(s) without angina pectoris: Secondary | ICD-10-CM | POA: Diagnosis not present

## 2018-01-21 DIAGNOSIS — L0390 Cellulitis, unspecified: Secondary | ICD-10-CM | POA: Diagnosis not present

## 2018-01-21 DIAGNOSIS — F419 Anxiety disorder, unspecified: Secondary | ICD-10-CM | POA: Diagnosis not present

## 2018-01-21 DIAGNOSIS — G309 Alzheimer's disease, unspecified: Secondary | ICD-10-CM | POA: Diagnosis not present

## 2018-01-23 ENCOUNTER — Other Ambulatory Visit (HOSPITAL_COMMUNITY): Payer: Self-pay | Admitting: Pulmonary Disease

## 2018-01-23 DIAGNOSIS — G309 Alzheimer's disease, unspecified: Secondary | ICD-10-CM | POA: Diagnosis not present

## 2018-01-23 DIAGNOSIS — I10 Essential (primary) hypertension: Secondary | ICD-10-CM | POA: Diagnosis not present

## 2018-01-23 DIAGNOSIS — M25561 Pain in right knee: Secondary | ICD-10-CM | POA: Diagnosis not present

## 2018-01-23 DIAGNOSIS — I25810 Atherosclerosis of coronary artery bypass graft(s) without angina pectoris: Secondary | ICD-10-CM | POA: Diagnosis not present

## 2018-01-23 DIAGNOSIS — M7989 Other specified soft tissue disorders: Secondary | ICD-10-CM

## 2018-01-24 ENCOUNTER — Other Ambulatory Visit (HOSPITAL_COMMUNITY): Payer: Self-pay | Admitting: Pulmonary Disease

## 2018-01-24 ENCOUNTER — Ambulatory Visit (HOSPITAL_COMMUNITY)
Admission: RE | Admit: 2018-01-24 | Discharge: 2018-01-24 | Disposition: A | Discharge status: Home / Self Care | Payer: Medicare Other | Source: Ambulatory Visit | Attending: Pulmonary Disease | Admitting: Pulmonary Disease

## 2018-01-24 DIAGNOSIS — M25561 Pain in right knee: Secondary | ICD-10-CM

## 2018-01-24 DIAGNOSIS — M79671 Pain in right foot: Secondary | ICD-10-CM

## 2018-01-24 DIAGNOSIS — M7121 Synovial cyst of popliteal space [Baker], right knee: Secondary | ICD-10-CM | POA: Diagnosis not present

## 2018-01-24 DIAGNOSIS — M1711 Unilateral primary osteoarthritis, right knee: Secondary | ICD-10-CM | POA: Insufficient documentation

## 2018-01-24 DIAGNOSIS — M7989 Other specified soft tissue disorders: Secondary | ICD-10-CM

## 2018-01-24 DIAGNOSIS — M79661 Pain in right lower leg: Secondary | ICD-10-CM | POA: Diagnosis not present

## 2018-01-26 ENCOUNTER — Other Ambulatory Visit: Payer: Self-pay

## 2018-01-26 ENCOUNTER — Emergency Department (HOSPITAL_COMMUNITY)
Admission: EM | Admit: 2018-01-26 | Discharge: 2018-01-27 | Disposition: A | Discharge status: Home / Self Care | Payer: Medicare Other | Attending: Emergency Medicine | Admitting: Emergency Medicine

## 2018-01-26 ENCOUNTER — Encounter (HOSPITAL_COMMUNITY): Payer: Self-pay | Admitting: *Deleted

## 2018-01-26 DIAGNOSIS — F0390 Unspecified dementia without behavioral disturbance: Secondary | ICD-10-CM | POA: Insufficient documentation

## 2018-01-26 DIAGNOSIS — N3000 Acute cystitis without hematuria: Secondary | ICD-10-CM

## 2018-01-26 DIAGNOSIS — I481 Persistent atrial fibrillation: Secondary | ICD-10-CM | POA: Insufficient documentation

## 2018-01-26 DIAGNOSIS — W19XXXA Unspecified fall, initial encounter: Secondary | ICD-10-CM

## 2018-01-26 DIAGNOSIS — R410 Disorientation, unspecified: Secondary | ICD-10-CM | POA: Diagnosis not present

## 2018-01-26 DIAGNOSIS — I4819 Other persistent atrial fibrillation: Secondary | ICD-10-CM

## 2018-01-26 DIAGNOSIS — I4891 Unspecified atrial fibrillation: Secondary | ICD-10-CM | POA: Diagnosis not present

## 2018-01-26 DIAGNOSIS — Z79899 Other long term (current) drug therapy: Secondary | ICD-10-CM | POA: Insufficient documentation

## 2018-01-26 DIAGNOSIS — R4182 Altered mental status, unspecified: Secondary | ICD-10-CM | POA: Insufficient documentation

## 2018-01-26 DIAGNOSIS — I10 Essential (primary) hypertension: Secondary | ICD-10-CM | POA: Insufficient documentation

## 2018-01-26 DIAGNOSIS — Y92009 Unspecified place in unspecified non-institutional (private) residence as the place of occurrence of the external cause: Secondary | ICD-10-CM

## 2018-01-26 HISTORY — DX: Unspecified dementia, unspecified severity, without behavioral disturbance, psychotic disturbance, mood disturbance, and anxiety: F03.90

## 2018-01-26 LAB — COMPREHENSIVE METABOLIC PANEL
ALT: 46 U/L (ref 14–54)
AST: 37 U/L (ref 15–41)
Albumin: 3.4 g/dL — ABNORMAL LOW (ref 3.5–5.0)
Alkaline Phosphatase: 211 U/L — ABNORMAL HIGH (ref 38–126)
Anion gap: 11 (ref 5–15)
BUN: 30 mg/dL — ABNORMAL HIGH (ref 6–20)
CO2: 24 mmol/L (ref 22–32)
Calcium: 9 mg/dL (ref 8.9–10.3)
Chloride: 107 mmol/L (ref 101–111)
Creatinine, Ser: 0.77 mg/dL (ref 0.44–1.00)
GFR calc Af Amer: >60 mL/min (ref >60)
GFR calc non Af Amer: >60 mL/min (ref >60)
Glucose, Bld: 128 mg/dL — ABNORMAL HIGH (ref 65–99)
Potassium: 3.3 mmol/L — ABNORMAL LOW (ref 3.5–5.1)
Sodium: 142 mmol/L (ref 135–145)
Total Bilirubin: 0.6 mg/dL (ref 0.3–1.2)
Total Protein: 6.2 g/dL — ABNORMAL LOW (ref 6.5–8.1)

## 2018-01-26 LAB — URINALYSIS, ROUTINE W REFLEX MICROSCOPIC
Bacteria, UA: NONE SEEN
Bilirubin Urine: NEGATIVE
GLUCOSE, UA: NEGATIVE mg/dL
KETONES UR: 5 mg/dL — AB
Leukocytes, UA: NEGATIVE
Nitrite: NEGATIVE
Protein, ur: NEGATIVE mg/dL
SPECIFIC GRAVITY, URINE: 1.021 (ref 1.005–1.030)
pH: 5 (ref 5.0–8.0)

## 2018-01-26 LAB — CBC
HCT: 40.4 % (ref 36.0–46.0)
Hemoglobin: 13.3 g/dL (ref 12.0–15.0)
MCH: 30 pg (ref 26.0–34.0)
MCHC: 32.9 g/dL (ref 30.0–36.0)
MCV: 91 fL (ref 78.0–100.0)
Platelets: 311 10*3/uL (ref 150–400)
RBC: 4.44 MIL/uL (ref 3.87–5.11)
RDW: 14 % (ref 11.5–15.5)
WBC: 21.9 10*3/uL — ABNORMAL HIGH (ref 4.0–10.5)

## 2018-01-26 NOTE — ED Notes (Signed)
ED Provider at bedside. 

## 2018-01-26 NOTE — ED Provider Notes (Signed)
[facility] EMERGENCY DEPARTMENT Provider Note   CSN: [id number] Arrival date & time: 01/26/18  2216  Time seen 23:04 PM    History   Chief Complaint Chief Complaint  Patient presents with  . Altered Mental Status   Level 5 caveat for dementia  HPI [place] T Elster is a 84 y.o. female.  HPI daughter states she goes to visit her mother daily.  She was there today until about noon.  She states her mother was doing very well this week.  She has a history of anxiety and chronic knee pain and when those are flared up patient gets more confused.  She was recently started on low-dose steroids for her complaints of arthritis pain in her knees and that has helped tremendously.  This evening a neighbor found the patient laying on her stomach on her front porch with her feet outside of the house.  She did not have her Sigala with her.  Patient cannot tell how long she had been laying that way.  She denies anything new hurting her.  Daughter surmises that patient felt good today and she went outside when she turned around she lost her balance and fell because she did not have her Blanck with her.  Patient has been having some swelling in her right leg and had x-rays done and a Doppler ultrasound done 2 days ago to evaluate that.  PCP [name], MD Ortho Dr Hilda Lias  Past Medical History:  Diagnosis Date  . Dementia   . Hypertension     There are no active problems to display for this patient.   History reviewed. No pertinent surgical history.   OB History   None      Home Medications    Prior to Admission medications   Medication Sig Start Date End Date Taking? Authorizing Provider  Cholecalciferol (VITAMIN D3 PO) Take 1 capsule by mouth daily.   Yes [provider]  diltiazem (CARDIZEM CD) 300 MG 24 hr capsule Take 1 capsule (300 mg total) by mouth daily. 10/07/13  Yes Runell Gess, MD  ibuprofen (ADVIL,MOTRIN) 200 MG tablet Take 200 mg by mouth every 6 (six)  hours as needed.   Yes [provider]  lisinopril (PRINIVIL,ZESTRIL) 20 MG tablet Take 1 tablet (20 mg total) by mouth daily. 11/06/13  Yes Croitoru, Mihai, MD  methylPREDNISolone (MEDROL DOSEPAK) 4 MG TBPK tablet Take 4 mg by mouth as directed.  01/23/18  Yes [provider]  PARoxetine (PAXIL) 20 MG tablet Take 20 mg by mouth daily.  01/16/17  Yes [provider]    Family History Family History  Problem Relation Age of Onset  . Hypertension Father   . Stroke Father   . Diabetes Maternal Grandmother     Social History Social History   Tobacco Use  . Smoking status: Never Smoker  . Smokeless tobacco: Never Used  Substance Use Topics  . Alcohol use: Not on file  . Drug use: Not on file  lives at home Lives alone Uses a Sabatino   Allergies   Bactrim [sulfamethoxazole-trimethoprim] and Penicillins   Review of Systems Review of Systems  All other systems reviewed and are negative.    Physical Exam Updated Vital Signs BP (!) 177/94 (BP Location: Left Arm)   Pulse 89   Temp 98.8 F (37.1 C) (Rectal)   Resp 16   Ht 5\' 1"  (1.549 m)   Wt 59 kg (130 lb)   SpO2 98%   BMI 24.56  kg/m   Vital signs normal except for hypertension   Physical Exam  Constitutional: She appears well-developed and well-nourished.  Non-toxic appearance. She does not appear ill. No distress.  HENT:  Head: Normocephalic and atraumatic.  Right Ear: External ear normal.  Left Ear: External ear normal.  Nose: Nose normal. No mucosal edema or rhinorrhea.  Mouth/Throat: Oropharynx is clear and moist and mucous membranes are normal. No dental abscesses or uvula swelling.  Eyes: Pupils are equal, round, and reactive to light. Conjunctivae and EOM are normal.  Patient is noted to have some soft swelling of her upper eyelids, left worse than the right which daughter states is new.  Neck: Normal range of motion and full passive range of motion without pain. Neck supple.    Cardiovascular: Normal rate, regular rhythm and normal heart sounds. Exam reveals no gallop and no friction rub.  No murmur heard. Pulmonary/Chest: Effort normal and breath sounds normal. No respiratory distress. She has no wheezes. She has no rhonchi. She has no rales. She exhibits no tenderness and no crepitus.  Abdominal: Soft. Normal appearance and bowel sounds are normal. She exhibits no distension. There is no tenderness. There is no rebound and no guarding.  Musculoskeletal: Normal range of motion. She exhibits no edema or tenderness.  Moves all extremities well.   Neurological: She is alert. She has normal strength. No cranial nerve deficit.  Skin: Skin is warm, dry and intact. No rash noted. No erythema. No pallor.  Psychiatric: She has a normal mood and affect. Her speech is normal and behavior is normal. Her mood appears not anxious.  Nursing note and vitals reviewed.    ED Treatments / Results  Labs (all labs ordered are listed, but only abnormal results are displayed) Results for orders placed or performed during the hospital encounter of 01/26/18  Comprehensive metabolic panel  Result Value Ref Range   Sodium 142 135 - 145 mmol/L   Potassium 3.3 (L) 3.5 - 5.1 mmol/L   Chloride 107 101 - 111 mmol/L   CO2 24 22 - 32 mmol/L   Glucose, Bld 128 (H) 65 - 99 mg/dL   BUN 30 (H) 6 - 20 mg/dL   Creatinine, Ser 1.610.77 0.44 - 1.00 mg/dL   Calcium 9.0 8.9 - 09.610.3 mg/dL   Total Protein 6.2 (L) 6.5 - 8.1 g/dL   Albumin 3.4 (L) 3.5 - 5.0 g/dL   AST 37 15 - 41 U/L   ALT 46 14 - 54 U/L   Alkaline Phosphatase 211 (H) 38 - 126 U/L   Total Bilirubin 0.6 0.3 - 1.2 mg/dL   GFR calc non Af Amer >60 >60 mL/min   GFR calc Af Amer >60 >60 mL/min   Anion gap 11 5 - 15  CBC  Result Value Ref Range   WBC 21.9 (H) 4.0 - 10.5 K/uL   RBC 4.44 3.87 - 5.11 MIL/uL   Hemoglobin 13.3 12.0 - 15.0 g/dL   HCT 04.540.4 40.936.0 - 81.146.0 %   MCV 91.0 78.0 - 100.0 fL   MCH 30.0 26.0 - 34.0 pg   MCHC 32.9 30.0 -  36.0 g/dL   RDW 91.414.0 78.211.5 - 95.615.5 %   Platelets 311 150 - 400 K/uL  Urinalysis, Routine w reflex microscopic  Result Value Ref Range   Color, Urine YELLOW YELLOW   APPearance CLEAR CLEAR   Specific Gravity, Urine 1.021 1.005 - 1.030   pH 5.0 5.0 - 8.0   Glucose, UA NEGATIVE NEGATIVE mg/dL  Hgb urine dipstick SMALL (A) NEGATIVE   Bilirubin Urine NEGATIVE NEGATIVE   Ketones, ur 5 (A) NEGATIVE mg/dL   Protein, ur NEGATIVE NEGATIVE mg/dL   Nitrite NEGATIVE NEGATIVE   Leukocytes, UA NEGATIVE NEGATIVE   RBC / HPF 0-5 0 - 5 RBC/hpf   WBC, UA 0-5 0 - 5 WBC/hpf   Bacteria, UA NONE SEEN NONE SEEN   Squamous Epithelial / LPF 0-5 0 - 5   Mucus PRESENT    Hyaline Casts, UA PRESENT   CK  Result Value Ref Range   Total CK 313 (H) 38 - 234 U/L  Troponin I  Result Value Ref Range   Troponin I 0.07 (HH) <0.03 ng/mL  Troponin I  Result Value Ref Range   Troponin I 0.07 (HH) <0.03 ng/mL   Laboratory interpretation all normal except leukocytosis consistent with recent steroid use, negative delta troponin   EKG EKG Interpretation  Date/Time:  Saturday January 26 2018 22:36:45 EDT Ventricular Rate:  85 PR Interval:    QRS Duration: 108 QT Interval:  404 QTC Calculation: 481 R Axis:   46 Text Interpretation:  Undetermined rhythm Poor R wave progression Artifact in lead(s) I II aVR aVL aVF V1 Electrode noise No old tracing to compare Confirmed by Devoria Albe ([id number]) on 01/26/2018 11:02:29 PM   #2 EKG  EKG Interpretation  Date/Time:  Sunday January 27 2018 02:07:32 EDT Ventricular Rate:  161 PR Interval:    QRS Duration: 77 QT Interval:  296 QTC Calculation: 485 R Axis:   53 Text Interpretation:  Atrial fibrillation with rapid V-rate Repolarization abnormality, prob rate related Confirmed by Devoria Albe ([id number]) on 01/27/2018 2:23:06 AM         Radiology No results found.   US Venous Img Lower Unilateral Right  Result Date: 01/24/2018 CLINICAL DATA:  Right lower extremity and knee  pain. Foot swelling. Symptoms for 2 days. IMPRESSION: No evidence of right lower extremity DVT. Baker's cyst. Electronically Signed   By: Jolaine Click M.D.   On: 01/24/2018 13:16   Dg Knee Complete 4 Views Right  Result Date: 01/24/2018 CLINICAL DATA:  Chronic anterior right knee pain.  No known injury.  IMPRESSION: No acute abnormality. Advanced tricompartmental osteoarthritis is worst medially. Electronically Signed   By: Drusilla Kanner M.D.   On: 01/24/2018 14:22   Dg Foot Complete Right  Result Date: 01/24/2018 CLINICAL DATA:  Right foot pain  IMPRESSION: No acute osseous injury of the right foot. Electronically Signed   By: Elige Ko   On: 01/24/2018 14:23    Procedures Procedures (including critical care time)  Medications Ordered in ED Medications  metoprolol tartrate (LOPRESSOR) injection 5 mg (has no administration in time range)  potassium chloride SA (K-DUR,KLOR-CON) CR tablet 40 mEq (40 mEq Oral Given 01/27/18 0112)  metoprolol tartrate (LOPRESSOR) injection 5 mg (5 mg Intravenous Given 01/27/18 0226)     Initial Impression / Assessment and Plan / ED Course  I have reviewed the triage vital signs and the nursing notes.  Pertinent labs & imaging results that were available during my care of the patient were reviewed by me and considered in my medical decision making (see chart for details).     Daughter states patient has refused CT scans as recently as a year ago.  She states her mother is able to make that decision.  Patient refused CT scan of her head and face tonight to look for fracture.  Recheck at 1 AM patient states she  is doing fine.  We discussed her test results which showed no evidence of rhabdomyolysis, she did have a mild hypokalemia which was treated with oral potassium.  Her initial troponin was positive, we discussed getting a delta troponin at about 130.  If the troponin is about the same she can be discharged home, however if it is more elevated she  will need to be admitted to the hospital.  Patient's urine had normal number of white blood cells although there were some present.  Urine culture was sent.  Daughter states she is very sensitive to antibiotics so she was not started on antibiotics.  2:08 AM patient went into atrial fibrillation with fast ventricular response with rate of 160.  Due to nationwide shortage of Cardizem she was given metoprolol IV.  Patient was given metoprolol at 2:26 AM.  When she was rechecked at 3 AM her heart rate was in the 80s.  Daughter states that patient got very anxious because she had to urinate and did not realize she had the purewick in place, once she realized she could urinate she calmed down and the metoprolol kicked in and her heart rate was in the 80's.  Daughter states she has a long history of anxiety.  She states when she was admitted to the hospital for her atrial fibrillation it would be hard to control during the day when she was awake however when she would sleep at night her blood pressure and her heart rate would be easily controlled.  Patient feels ready to be discharged and daughter is also ready for her to be discharged.  They will follow-up with her primary care doctor on Monday or Tuesday to get the results of her urine culture.  We also discussed getting a medic alert button for patient to wear in case she falls again.  Patient has been resistant to this before.  Her delta troponin was without acute changes and she was discharged home.  Final Clinical Impressions(s) / ED Diagnoses   Final diagnoses:  Acute cystitis without hematuria  Fall in home, initial encounter  Persistent atrial fibrillation [facility])    ED Discharge Orders    None      Plan discharge  Devoria Albe, MD, Concha Pyo, MD 01/27/18 [phone]

## 2018-01-26 NOTE — ED Triage Notes (Addendum)
Pt was found lying in doorway of her house by her neighbor tonight, unsure of how long pt was lying in the doorway, pt confused, daughter on scene reported pt has mild dementia and that this confusion is not normal for pt , pt able to state her name, states that she is at her sister's house, unsure of time or date, denies any pain, pt has swelling noted to left eyebrow area, has bruising and abrasion noted to right shoulder and right ankle,

## 2018-01-26 NOTE — ED Notes (Signed)
Per daughter pt mental status is her baseline

## 2018-01-27 DIAGNOSIS — R4182 Altered mental status, unspecified: Secondary | ICD-10-CM | POA: Diagnosis not present

## 2018-01-27 LAB — TROPONIN I
TROPONIN I: 0.07 ng/mL — AB (ref <0.03)
Troponin I: 0.07 ng/mL (ref <0.03)

## 2018-01-27 LAB — CK: Total CK: 313 U/L — ABNORMAL HIGH (ref 38–234)

## 2018-01-27 MED ORDER — METOPROLOL TARTRATE 5 MG/5ML IV SOLN
5.0000 mg | Freq: Once | INTRAVENOUS | Status: AC
  Administered 2018-01-27: 5 mg via INTRAVENOUS
  Filled 2018-01-27: qty 5

## 2018-01-27 MED ORDER — METOPROLOL TARTRATE 5 MG/5ML IV SOLN: 5.0000 mg | INTRAVENOUS | Status: DC | PRN

## 2018-01-27 MED ORDER — POTASSIUM CHLORIDE CRYS ER 20 MEQ PO TBCR
40.0000 meq | EXTENDED_RELEASE_TABLET | Freq: Once | ORAL | Status: AC
Start: 2018-01-27 — End: 2018-01-27
  Administered 2018-01-27: 40 meq via ORAL
  Filled 2018-01-27: qty 2

## 2018-01-27 NOTE — ED Notes (Signed)
CRITICAL VALUE ALERT  Critical Value:  Troponin 0.07  Date & Time Notied:  01/27/18 @ 0005 Provider Notified: Lynelle DoctorKnapp  Orders Received/Actions taken: None Yet

## 2018-01-27 NOTE — Discharge Instructions (Addendum)
Have your doctor check your urine culture results on Monday or Tuesday, June 24 or 25th. Continue your medications. Consider getting a medic alert button in case you fall again. Return to the ED for any problems listed on the head injury sheet.

## 2018-01-28 LAB — URINE CULTURE
Culture: NO GROWTH
SPECIAL REQUESTS: NORMAL

## 2018-05-08 DIAGNOSIS — Z23 Encounter for immunization: Secondary | ICD-10-CM | POA: Diagnosis not present

## 2018-05-08 DIAGNOSIS — G309 Alzheimer's disease, unspecified: Secondary | ICD-10-CM | POA: Diagnosis not present

## 2018-05-08 DIAGNOSIS — F321 Major depressive disorder, single episode, moderate: Secondary | ICD-10-CM | POA: Diagnosis not present

## 2018-05-08 DIAGNOSIS — I10 Essential (primary) hypertension: Secondary | ICD-10-CM | POA: Diagnosis not present

## 2018-05-08 DIAGNOSIS — I25810 Atherosclerosis of coronary artery bypass graft(s) without angina pectoris: Secondary | ICD-10-CM | POA: Diagnosis not present

## 2018-06-28 DIAGNOSIS — I10 Essential (primary) hypertension: Secondary | ICD-10-CM | POA: Diagnosis not present

## 2018-06-28 DIAGNOSIS — F321 Major depressive disorder, single episode, moderate: Secondary | ICD-10-CM | POA: Diagnosis not present

## 2018-06-28 DIAGNOSIS — I25810 Atherosclerosis of coronary artery bypass graft(s) without angina pectoris: Secondary | ICD-10-CM | POA: Diagnosis not present

## 2018-06-28 DIAGNOSIS — G309 Alzheimer's disease, unspecified: Secondary | ICD-10-CM | POA: Diagnosis not present

## 2018-07-10 DIAGNOSIS — I2510 Atherosclerotic heart disease of native coronary artery without angina pectoris: Secondary | ICD-10-CM | POA: Diagnosis not present

## 2018-07-10 DIAGNOSIS — I10 Essential (primary) hypertension: Secondary | ICD-10-CM | POA: Diagnosis not present

## 2018-07-10 DIAGNOSIS — F419 Anxiety disorder, unspecified: Secondary | ICD-10-CM | POA: Diagnosis not present

## 2018-07-10 DIAGNOSIS — G309 Alzheimer's disease, unspecified: Secondary | ICD-10-CM | POA: Diagnosis not present

## 2018-10-07 DIAGNOSIS — I10 Essential (primary) hypertension: Secondary | ICD-10-CM | POA: Diagnosis not present

## 2018-10-07 DIAGNOSIS — G309 Alzheimer's disease, unspecified: Secondary | ICD-10-CM | POA: Diagnosis not present

## 2018-10-07 DIAGNOSIS — I2510 Atherosclerotic heart disease of native coronary artery without angina pectoris: Secondary | ICD-10-CM | POA: Diagnosis not present

## 2018-10-07 DIAGNOSIS — M1990 Unspecified osteoarthritis, unspecified site: Secondary | ICD-10-CM | POA: Diagnosis not present

## 2018-12-14 ENCOUNTER — Emergency Department (HOSPITAL_COMMUNITY): Payer: Medicare Other

## 2018-12-14 ENCOUNTER — Inpatient Hospital Stay (HOSPITAL_COMMUNITY): Payer: Medicare Other

## 2018-12-14 ENCOUNTER — Inpatient Hospital Stay (HOSPITAL_COMMUNITY): Payer: Medicare Other | Admitting: Critical Care Medicine

## 2018-12-14 ENCOUNTER — Other Ambulatory Visit: Payer: Self-pay

## 2018-12-14 ENCOUNTER — Encounter (HOSPITAL_COMMUNITY)
Admission: EM | Disposition: A | Discharge status: Home Health | Payer: Self-pay | Source: Home / Self Care | Attending: Internal Medicine

## 2018-12-14 ENCOUNTER — Encounter (HOSPITAL_COMMUNITY): Payer: Self-pay | Admitting: Emergency Medicine

## 2018-12-14 ENCOUNTER — Inpatient Hospital Stay (HOSPITAL_COMMUNITY)
Admission: EM | Admit: 2018-12-14 | Discharge: 2018-12-17 | DRG: 480 | Disposition: A | Discharge status: Home Health | Payer: Medicare Other | Attending: Internal Medicine | Admitting: Internal Medicine

## 2018-12-14 DIAGNOSIS — I482 Chronic atrial fibrillation, unspecified: Secondary | ICD-10-CM | POA: Diagnosis not present

## 2018-12-14 DIAGNOSIS — Z1159 Encounter for screening for other viral diseases: Secondary | ICD-10-CM

## 2018-12-14 DIAGNOSIS — R0689 Other abnormalities of breathing: Secondary | ICD-10-CM | POA: Diagnosis not present

## 2018-12-14 DIAGNOSIS — E875 Hyperkalemia: Secondary | ICD-10-CM | POA: Diagnosis present

## 2018-12-14 DIAGNOSIS — Z882 Allergy status to sulfonamides status: Secondary | ICD-10-CM | POA: Diagnosis not present

## 2018-12-14 DIAGNOSIS — Y92009 Unspecified place in unspecified non-institutional (private) residence as the place of occurrence of the external cause: Secondary | ICD-10-CM | POA: Diagnosis not present

## 2018-12-14 DIAGNOSIS — Z7952 Long term (current) use of systemic steroids: Secondary | ICD-10-CM

## 2018-12-14 DIAGNOSIS — S7291XA Unspecified fracture of right femur, initial encounter for closed fracture: Secondary | ICD-10-CM | POA: Diagnosis not present

## 2018-12-14 DIAGNOSIS — F0390 Unspecified dementia without behavioral disturbance: Secondary | ICD-10-CM | POA: Diagnosis not present

## 2018-12-14 DIAGNOSIS — E042 Nontoxic multinodular goiter: Secondary | ICD-10-CM | POA: Diagnosis present

## 2018-12-14 DIAGNOSIS — D62 Acute posthemorrhagic anemia: Secondary | ICD-10-CM | POA: Diagnosis not present

## 2018-12-14 DIAGNOSIS — Z681 Body mass index (BMI) 19 or less, adult: Secondary | ICD-10-CM | POA: Diagnosis not present

## 2018-12-14 DIAGNOSIS — S7223XA Displaced subtrochanteric fracture of unspecified femur, initial encounter for closed fracture: Principal | ICD-10-CM | POA: Diagnosis present

## 2018-12-14 DIAGNOSIS — E43 Unspecified severe protein-calorie malnutrition: Secondary | ICD-10-CM | POA: Diagnosis present

## 2018-12-14 DIAGNOSIS — Z880 Allergy status to penicillin: Secondary | ICD-10-CM | POA: Diagnosis not present

## 2018-12-14 DIAGNOSIS — Z79899 Other long term (current) drug therapy: Secondary | ICD-10-CM | POA: Diagnosis not present

## 2018-12-14 DIAGNOSIS — S72141A Displaced intertrochanteric fracture of right femur, initial encounter for closed fracture: Secondary | ICD-10-CM | POA: Diagnosis not present

## 2018-12-14 DIAGNOSIS — S72001A Fracture of unspecified part of neck of right femur, initial encounter for closed fracture: Secondary | ICD-10-CM | POA: Diagnosis not present

## 2018-12-14 DIAGNOSIS — R739 Hyperglycemia, unspecified: Secondary | ICD-10-CM | POA: Diagnosis not present

## 2018-12-14 DIAGNOSIS — T148XXA Other injury of unspecified body region, initial encounter: Secondary | ICD-10-CM

## 2018-12-14 DIAGNOSIS — S0990XA Unspecified injury of head, initial encounter: Secondary | ICD-10-CM | POA: Diagnosis not present

## 2018-12-14 DIAGNOSIS — I10 Essential (primary) hypertension: Secondary | ICD-10-CM | POA: Diagnosis not present

## 2018-12-14 DIAGNOSIS — S7001XA Contusion of right hip, initial encounter: Secondary | ICD-10-CM | POA: Diagnosis not present

## 2018-12-14 DIAGNOSIS — W1830XA Fall on same level, unspecified, initial encounter: Secondary | ICD-10-CM | POA: Diagnosis present

## 2018-12-14 DIAGNOSIS — R52 Pain, unspecified: Secondary | ICD-10-CM | POA: Diagnosis not present

## 2018-12-14 DIAGNOSIS — Z886 Allergy status to analgesic agent status: Secondary | ICD-10-CM

## 2018-12-14 DIAGNOSIS — Z03818 Encounter for observation for suspected exposure to other biological agents ruled out: Secondary | ICD-10-CM | POA: Diagnosis not present

## 2018-12-14 DIAGNOSIS — Z8249 Family history of ischemic heart disease and other diseases of the circulatory system: Secondary | ICD-10-CM | POA: Diagnosis not present

## 2018-12-14 DIAGNOSIS — F410 Panic disorder [episodic paroxysmal anxiety] without agoraphobia: Secondary | ICD-10-CM | POA: Diagnosis present

## 2018-12-14 DIAGNOSIS — R410 Disorientation, unspecified: Secondary | ICD-10-CM

## 2018-12-14 DIAGNOSIS — R404 Transient alteration of awareness: Secondary | ICD-10-CM | POA: Diagnosis not present

## 2018-12-14 DIAGNOSIS — M25551 Pain in right hip: Secondary | ICD-10-CM | POA: Diagnosis not present

## 2018-12-14 DIAGNOSIS — I4892 Unspecified atrial flutter: Secondary | ICD-10-CM | POA: Diagnosis not present

## 2018-12-14 DIAGNOSIS — I480 Paroxysmal atrial fibrillation: Secondary | ICD-10-CM | POA: Diagnosis not present

## 2018-12-14 DIAGNOSIS — S72009A Fracture of unspecified part of neck of unspecified femur, initial encounter for closed fracture: Secondary | ICD-10-CM

## 2018-12-14 DIAGNOSIS — I959 Hypotension, unspecified: Secondary | ICD-10-CM | POA: Diagnosis not present

## 2018-12-14 DIAGNOSIS — S299XXA Unspecified injury of thorax, initial encounter: Secondary | ICD-10-CM | POA: Diagnosis not present

## 2018-12-14 DIAGNOSIS — S51012A Laceration without foreign body of left elbow, initial encounter: Secondary | ICD-10-CM | POA: Diagnosis not present

## 2018-12-14 DIAGNOSIS — R0902 Hypoxemia: Secondary | ICD-10-CM | POA: Diagnosis not present

## 2018-12-14 DIAGNOSIS — Z7401 Bed confinement status: Secondary | ICD-10-CM | POA: Diagnosis not present

## 2018-12-14 DIAGNOSIS — M2550 Pain in unspecified joint: Secondary | ICD-10-CM | POA: Diagnosis not present

## 2018-12-14 DIAGNOSIS — W19XXXA Unspecified fall, initial encounter: Secondary | ICD-10-CM

## 2018-12-14 DIAGNOSIS — S199XXA Unspecified injury of neck, initial encounter: Secondary | ICD-10-CM | POA: Diagnosis not present

## 2018-12-14 HISTORY — DX: Unspecified atrial fibrillation: I48.91

## 2018-12-14 HISTORY — PX: INTRAMEDULLARY (IM) NAIL INTERTROCHANTERIC: SHX5875

## 2018-12-14 LAB — URINALYSIS, ROUTINE W REFLEX MICROSCOPIC
Bilirubin Urine: NEGATIVE
Glucose, UA: NEGATIVE mg/dL
Hgb urine dipstick: NEGATIVE
Ketones, ur: NEGATIVE mg/dL
Leukocytes,Ua: NEGATIVE
Nitrite: NEGATIVE
Protein, ur: NEGATIVE mg/dL
Specific Gravity, Urine: 1.014 (ref 1.005–1.030)
pH: 6 (ref 5.0–8.0)

## 2018-12-14 LAB — TYPE AND SCREEN
ABO/RH(D): O POS
Antibody Screen: NEGATIVE

## 2018-12-14 LAB — CBC WITH DIFFERENTIAL/PLATELET
Abs Immature Granulocytes: 0.06 10*3/uL (ref 0.00–0.07)
Basophils Absolute: 0.1 10*3/uL (ref 0.0–0.1)
Basophils Relative: 1 %
Eosinophils Absolute: 0.2 10*3/uL (ref 0.0–0.5)
Eosinophils Relative: 1 %
HCT: 37 % (ref 36.0–46.0)
Hemoglobin: 11.9 g/dL — ABNORMAL LOW (ref 12.0–15.0)
Immature Granulocytes: 1 %
Lymphocytes Relative: 8 %
Lymphs Abs: 0.9 10*3/uL (ref 0.7–4.0)
MCH: 29.4 pg (ref 26.0–34.0)
MCHC: 32.2 g/dL (ref 30.0–36.0)
MCV: 91.4 fL (ref 80.0–100.0)
Monocytes Absolute: 0.7 10*3/uL (ref 0.1–1.0)
Monocytes Relative: 6 %
Neutro Abs: 9.2 10*3/uL — ABNORMAL HIGH (ref 1.7–7.7)
Neutrophils Relative %: 83 %
Platelets: 167 10*3/uL (ref 150–400)
RBC: 4.05 MIL/uL (ref 3.87–5.11)
RDW: 14.7 % (ref 11.5–15.5)
WBC: 11.1 10*3/uL — ABNORMAL HIGH (ref 4.0–10.5)
nRBC: 0 % (ref 0.0–0.2)

## 2018-12-14 LAB — BASIC METABOLIC PANEL
Anion gap: 12 (ref 5–15)
Anion gap: 13 (ref 5–15)
BUN: 21 mg/dL (ref 8–23)
BUN: 20 mg/dL (ref 8–23)
CO2: 22 mmol/L (ref 22–32)
CO2: 22 mmol/L (ref 22–32)
Calcium: 8.7 mg/dL — ABNORMAL LOW (ref 8.9–10.3)
Calcium: 8.5 mg/dL — ABNORMAL LOW (ref 8.9–10.3)
Chloride: 105 mmol/L (ref 98–111)
Chloride: 105 mmol/L (ref 98–111)
Creatinine, Ser: 1.05 mg/dL — ABNORMAL HIGH (ref 0.44–1.00)
Creatinine, Ser: 0.85 mg/dL (ref 0.44–1.00)
GFR calc Af Amer: 57 mL/min — ABNORMAL LOW (ref >60)
GFR calc Af Amer: >60 mL/min (ref >60)
GFR calc non Af Amer: 49 mL/min — ABNORMAL LOW (ref >60)
GFR calc non Af Amer: >60 mL/min (ref >60)
Glucose, Bld: 197 mg/dL — ABNORMAL HIGH (ref 70–99)
Glucose, Bld: 113 mg/dL — ABNORMAL HIGH (ref 70–99)
Potassium: 5.6 mmol/L — ABNORMAL HIGH (ref 3.5–5.1)
Potassium: 3.5 mmol/L (ref 3.5–5.1)
Sodium: 139 mmol/L (ref 135–145)
Sodium: 140 mmol/L (ref 135–145)

## 2018-12-14 LAB — PROTIME-INR
INR: 1.1 (ref 0.8–1.2)
Prothrombin Time: 14.5 seconds (ref 11.4–15.2)

## 2018-12-14 LAB — SURGICAL PCR SCREEN
MRSA, PCR: NEGATIVE
Staphylococcus aureus: POSITIVE — AB

## 2018-12-14 LAB — ABO/RH: ABO/RH(D): O POS

## 2018-12-14 LAB — SARS CORONAVIRUS 2 BY RT PCR (HOSPITAL ORDER, PERFORMED IN ~~LOC~~ HOSPITAL LAB): SARS Coronavirus 2: NEGATIVE

## 2018-12-14 SURGERY — FIXATION, FRACTURE, INTERTROCHANTERIC, WITH INTRAMEDULLARY ROD
Anesthesia: General | Laterality: Right

## 2018-12-14 MED ORDER — CEFAZOLIN SODIUM-DEXTROSE 1-4 GM/50ML-% IV SOLN
1.0000 g | Freq: Four times a day (QID) | INTRAVENOUS | Status: AC
  Administered 2018-12-14 – 2018-12-15 (×3): 1 g via INTRAVENOUS
  Filled 2018-12-14 (×3): qty 50

## 2018-12-14 MED ORDER — SERTRALINE HCL 25 MG PO TABS
12.5000 mg | ORAL_TABLET | Freq: Every day | ORAL | Status: DC
  Administered 2018-12-14 – 2018-12-17 (×4): 12.5 mg via ORAL
  Filled 2018-12-14 (×4): qty 0.5

## 2018-12-14 MED ORDER — HYDROCODONE-ACETAMINOPHEN 5-325 MG PO TABS: 1.0000 | ORAL_TABLET | Freq: Four times a day (QID) | ORAL | Status: DC | PRN

## 2018-12-14 MED ORDER — ONDANSETRON HCL 4 MG/2ML IJ SOLN
4.0000 mg | Freq: Four times a day (QID) | INTRAMUSCULAR | Status: DC | PRN
  Administered 2018-12-14 – 2018-12-16 (×2): 4 mg via INTRAVENOUS
  Filled 2018-12-14 (×2): qty 2

## 2018-12-14 MED ORDER — ADULT MULTIVITAMIN LIQUID CH
15.0000 mL | Freq: Every day | ORAL | Status: DC
  Administered 2018-12-14 – 2018-12-16 (×3): 15 mL via ORAL
  Filled 2018-12-14 (×4): qty 15

## 2018-12-14 MED ORDER — LIDOCAINE 2% (20 MG/ML) 5 ML SYRINGE
INTRAMUSCULAR | Status: DC | PRN
  Administered 2018-12-14: 40 mg via INTRAVENOUS

## 2018-12-14 MED ORDER — ENSURE ENLIVE PO LIQD
237.0000 mL | Freq: Two times a day (BID) | ORAL | Status: DC
  Administered 2018-12-15 – 2018-12-17 (×4): 237 mL via ORAL
  Filled 2018-12-14: qty 237

## 2018-12-14 MED ORDER — PHENYLEPHRINE 40 MCG/ML (10ML) SYRINGE FOR IV PUSH (FOR BLOOD PRESSURE SUPPORT)
PREFILLED_SYRINGE | INTRAVENOUS | Status: AC
  Filled 2018-12-14: qty 10

## 2018-12-14 MED ORDER — ONDANSETRON HCL 4 MG/2ML IJ SOLN: 4.0000 mg | Freq: Once | INTRAMUSCULAR | Status: DC | PRN

## 2018-12-14 MED ORDER — PROPOFOL 10 MG/ML IV BOLUS
INTRAVENOUS | Status: AC
  Filled 2018-12-14: qty 20

## 2018-12-14 MED ORDER — ACETAMINOPHEN 500 MG PO TABS
500.0000 mg | ORAL_TABLET | Freq: Four times a day (QID) | ORAL | Status: AC
  Administered 2018-12-14 – 2018-12-15 (×3): 500 mg via ORAL
  Filled 2018-12-14 (×4): qty 1

## 2018-12-14 MED ORDER — BISACODYL 5 MG PO TBEC: 5.0000 mg | DELAYED_RELEASE_TABLET | Freq: Every day | ORAL | Status: DC | PRN

## 2018-12-14 MED ORDER — DILTIAZEM HCL ER COATED BEADS 180 MG PO CP24
300.0000 mg | ORAL_CAPSULE | Freq: Every day | ORAL | Status: DC
  Administered 2018-12-14 – 2018-12-17 (×4): 300 mg via ORAL
  Filled 2018-12-14 (×4): qty 1

## 2018-12-14 MED ORDER — METOPROLOL TARTRATE 25 MG PO TABS
25.0000 mg | ORAL_TABLET | Freq: Two times a day (BID) | ORAL | Status: DC
  Administered 2018-12-14 – 2018-12-17 (×7): 25 mg via ORAL
  Filled 2018-12-14 (×7): qty 1

## 2018-12-14 MED ORDER — ONDANSETRON HCL 4 MG/2ML IJ SOLN
INTRAMUSCULAR | Status: DC | PRN
  Administered 2018-12-14: 4 mg via INTRAVENOUS

## 2018-12-14 MED ORDER — DOCUSATE SODIUM 100 MG PO CAPS: 100.0000 mg | ORAL_CAPSULE | Freq: Two times a day (BID) | ORAL | Status: DC

## 2018-12-14 MED ORDER — FENTANYL CITRATE (PF) 100 MCG/2ML IJ SOLN: 25.0000 ug | INTRAMUSCULAR | Status: DC | PRN

## 2018-12-14 MED ORDER — SUGAMMADEX SODIUM 200 MG/2ML IV SOLN
INTRAVENOUS | Status: DC | PRN
  Administered 2018-12-14: 183 mg via INTRAVENOUS

## 2018-12-14 MED ORDER — POLYETHYLENE GLYCOL 3350 17 G PO PACK: 17.0000 g | PACK | Freq: Every day | ORAL | Status: DC | PRN

## 2018-12-14 MED ORDER — RIVASTIGMINE 9.5 MG/24HR TD PT24
9.5000 mg | MEDICATED_PATCH | Freq: Every day | TRANSDERMAL | Status: DC
  Administered 2018-12-14 – 2018-12-17 (×4): 9.5 mg via TRANSDERMAL
  Filled 2018-12-14 (×4): qty 1

## 2018-12-14 MED ORDER — FENTANYL CITRATE (PF) 100 MCG/2ML IJ SOLN
50.0000 ug | INTRAMUSCULAR | Status: DC | PRN
  Administered 2018-12-14: 50 ug via INTRAVENOUS
  Filled 2018-12-14: qty 2

## 2018-12-14 MED ORDER — LACTATED RINGERS IV SOLN: INTRAVENOUS | Status: AC

## 2018-12-14 MED ORDER — DOCUSATE SODIUM 100 MG PO CAPS
100.0000 mg | ORAL_CAPSULE | Freq: Two times a day (BID) | ORAL | Status: DC
  Administered 2018-12-14 – 2018-12-16 (×5): 100 mg via ORAL
  Filled 2018-12-14 (×6): qty 1

## 2018-12-14 MED ORDER — LIDOCAINE 2% (20 MG/ML) 5 ML SYRINGE
INTRAMUSCULAR | Status: AC
  Filled 2018-12-14: qty 5

## 2018-12-14 MED ORDER — HYDROCODONE-ACETAMINOPHEN 5-325 MG PO TABS: 1.0000 | ORAL_TABLET | ORAL | Status: DC | PRN

## 2018-12-14 MED ORDER — PHENYLEPHRINE 40 MCG/ML (10ML) SYRINGE FOR IV PUSH (FOR BLOOD PRESSURE SUPPORT)
PREFILLED_SYRINGE | INTRAVENOUS | Status: DC | PRN
  Administered 2018-12-14 (×4): 40 ug via INTRAVENOUS

## 2018-12-14 MED ORDER — ROCURONIUM BROMIDE 10 MG/ML (PF) SYRINGE
PREFILLED_SYRINGE | INTRAVENOUS | Status: DC | PRN
  Administered 2018-12-14: 60 mg via INTRAVENOUS

## 2018-12-14 MED ORDER — ACETAMINOPHEN 325 MG PO TABS: 325.0000 mg | ORAL_TABLET | Freq: Four times a day (QID) | ORAL | Status: DC | PRN

## 2018-12-14 MED ORDER — METOCLOPRAMIDE HCL 5 MG PO TABS: 5.0000 mg | ORAL_TABLET | Freq: Three times a day (TID) | ORAL | Status: DC | PRN

## 2018-12-14 MED ORDER — FENTANYL CITRATE (PF) 250 MCG/5ML IJ SOLN
INTRAMUSCULAR | Status: AC
  Filled 2018-12-14: qty 5

## 2018-12-14 MED ORDER — SODIUM CHLORIDE 0.9 % IV SOLN
INTRAVENOUS | Status: DC | PRN
  Administered 2018-12-14: 15 ug/min via INTRAVENOUS

## 2018-12-14 MED ORDER — ONDANSETRON HCL 4 MG/2ML IJ SOLN
INTRAMUSCULAR | Status: AC
  Filled 2018-12-14: qty 2

## 2018-12-14 MED ORDER — HYDROCODONE-ACETAMINOPHEN 7.5-325 MG PO TABS: 1.0000 | ORAL_TABLET | ORAL | Status: DC | PRN

## 2018-12-14 MED ORDER — METHOCARBAMOL 500 MG PO TABS
500.0000 mg | ORAL_TABLET | Freq: Four times a day (QID) | ORAL | Status: DC | PRN
  Administered 2018-12-15 – 2018-12-16 (×2): 500 mg via ORAL
  Filled 2018-12-14 (×2): qty 1

## 2018-12-14 MED ORDER — CHLORHEXIDINE GLUCONATE 4 % EX LIQD: 60.0000 mL | Freq: Once | CUTANEOUS | Status: DC

## 2018-12-14 MED ORDER — MORPHINE SULFATE (PF) 2 MG/ML IV SOLN: 0.5000 mg | INTRAVENOUS | Status: DC | PRN

## 2018-12-14 MED ORDER — FENTANYL CITRATE (PF) 250 MCG/5ML IJ SOLN
INTRAMUSCULAR | Status: DC | PRN
  Administered 2018-12-14 (×2): 25 ug via INTRAVENOUS
  Administered 2018-12-14: 50 ug via INTRAVENOUS

## 2018-12-14 MED ORDER — METOCLOPRAMIDE HCL 5 MG/ML IJ SOLN: 5.0000 mg | Freq: Three times a day (TID) | INTRAMUSCULAR | Status: DC | PRN

## 2018-12-14 MED ORDER — SODIUM CHLORIDE 0.9 % IV BOLUS
500.0000 mL | Freq: Once | INTRAVENOUS | Status: AC
  Administered 2018-12-14: 09:00:00 500 mL via INTRAVENOUS

## 2018-12-14 MED ORDER — PROPOFOL 10 MG/ML IV BOLUS
INTRAVENOUS | Status: DC | PRN
  Administered 2018-12-14: 50 mg via INTRAVENOUS

## 2018-12-14 MED ORDER — LACTATED RINGERS IV SOLN
INTRAVENOUS | Status: DC | PRN
  Administered 2018-12-14: 12:00:00 via INTRAVENOUS

## 2018-12-14 MED ORDER — METHOCARBAMOL 1000 MG/10ML IJ SOLN
500.0000 mg | Freq: Four times a day (QID) | INTRAVENOUS | Status: DC | PRN
  Filled 2018-12-14: qty 5

## 2018-12-14 MED ORDER — BOOST / RESOURCE BREEZE PO LIQD CUSTOM
1.0000 | Freq: Three times a day (TID) | ORAL | Status: AC
  Administered 2018-12-14 – 2018-12-15 (×2): 1 via ORAL
  Filled 2018-12-14: qty 1

## 2018-12-14 MED ORDER — CEFAZOLIN SODIUM-DEXTROSE 2-4 GM/100ML-% IV SOLN
2.0000 g | INTRAVENOUS | Status: AC
  Administered 2018-12-14: 2 g via INTRAVENOUS
  Filled 2018-12-14: qty 100

## 2018-12-14 MED ORDER — ROCURONIUM BROMIDE 10 MG/ML (PF) SYRINGE
PREFILLED_SYRINGE | INTRAVENOUS | Status: AC
  Filled 2018-12-14: qty 10

## 2018-12-14 MED ORDER — ONDANSETRON HCL 4 MG PO TABS: 4.0000 mg | ORAL_TABLET | Freq: Four times a day (QID) | ORAL | Status: DC | PRN

## 2018-12-14 MED ORDER — 0.9 % SODIUM CHLORIDE (POUR BTL) OPTIME
TOPICAL | Status: DC | PRN
  Administered 2018-12-14: 1000 mL

## 2018-12-14 MED ORDER — POVIDONE-IODINE 10 % EX SWAB: 2.0000 "application " | Freq: Once | CUTANEOUS | Status: DC

## 2018-12-14 SURGICAL SUPPLY — 30 items
ALCOHOL 70% 16 OZ (MISCELLANEOUS) ×3 IMPLANT
BIT DRILL FLUTED FEMUR 4.2/3 (BIT) ×3 IMPLANT
BNDG COHESIVE 6X5 TAN STRL LF (GAUZE/BANDAGES/DRESSINGS) ×6 IMPLANT
COVER PERINEAL POST (MISCELLANEOUS) ×3 IMPLANT
COVER SURGICAL LIGHT HANDLE (MISCELLANEOUS) ×3 IMPLANT
DRAPE INCISE IOBAN 66X45 STRL (DRAPES) ×3 IMPLANT
DRAPE STERI IOBAN 125X83 (DRAPES) ×3 IMPLANT
DRSG ADAPTIC 3X8 NADH LF (GAUZE/BANDAGES/DRESSINGS) ×3 IMPLANT
ELECT REM PT RETURN 9FT ADLT (ELECTROSURGICAL) ×3
ELECTRODE REM PT RTRN 9FT ADLT (ELECTROSURGICAL) ×1 IMPLANT
GAUZE SPONGE 4X4 12PLY STRL (GAUZE/BANDAGES/DRESSINGS) ×3 IMPLANT
GLOVE BIO SURGEON STRL SZ7.5 (GLOVE) ×3 IMPLANT
GLOVE BIOGEL PI IND STRL 8 (GLOVE) ×1 IMPLANT
GLOVE BIOGEL PI INDICATOR 8 (GLOVE) ×2
GOWN STRL REUS W/ TWL LRG LVL3 (GOWN DISPOSABLE) IMPLANT
GOWN STRL REUS W/ TWL XL LVL3 (GOWN DISPOSABLE) ×2 IMPLANT
GOWN STRL REUS W/TWL LRG LVL3 (GOWN DISPOSABLE)
GOWN STRL REUS W/TWL XL LVL3 (GOWN DISPOSABLE) ×4
GUIDEWIRE 3.2X400 (WIRE) ×3 IMPLANT
KIT BASIN OR (CUSTOM PROCEDURE TRAY) ×3 IMPLANT
KIT TURNOVER KIT B (KITS) ×3 IMPLANT
NAIL TROCH FIX 10X235 RT 130 (Nail) ×3 IMPLANT
NS IRRIG 1000ML POUR BTL (IV SOLUTION) ×3 IMPLANT
PACK GENERAL/GYN (CUSTOM PROCEDURE TRAY) ×3 IMPLANT
PAD ARMBOARD 7.5X6 YLW CONV (MISCELLANEOUS) ×6 IMPLANT
SCREW LOCKING 5.0X32MM (Screw) ×3 IMPLANT
SCREW TFNA HIP 85 STRL (Screw) ×3 IMPLANT
STAPLER VISISTAT 35W (STAPLE) ×3 IMPLANT
SUT MON AB 2-0 CT1 36 (SUTURE) ×3 IMPLANT
TOWEL OR 17X26 10 PK STRL BLUE (TOWEL DISPOSABLE) ×3 IMPLANT

## 2018-12-14 NOTE — ED Triage Notes (Signed)
Per EMS: pt found outside on ground by passerby.  Unknown down time.  Pt has dementia and lives home alone. Pt has obvious deformity to right hip with shortening and rotation.  EMS administered 50 mcg of fentanyl PTA.    EMS vitals:   BP 142/42 CBG 218 Sp02 95% RA  Temp 95.2-98.2 HR - Irregular with history of AFIb

## 2018-12-14 NOTE — Progress Notes (Signed)
Patient taken to OR. Ancef sent with patient to be hung. Cardizem medication not available. Report called to [facility], RN.

## 2018-12-14 NOTE — Anesthesia Procedure Notes (Signed)
Procedure Name: Intubation Date/Time: 12/14/2018 1:00 PM Performed by: Wilburn Cornelia, CRNA Pre-anesthesia Checklist: Patient identified, Emergency Drugs available, Suction available, Patient being monitored and Timeout performed Patient Re-evaluated:Patient Re-evaluated prior to induction Oxygen Delivery Method: Circle system utilized Preoxygenation: Pre-oxygenation with 100% oxygen Induction Type: IV induction Ventilation: Mask ventilation without difficulty Laryngoscope Size: Mac and 3 Grade View: Grade II Tube type: Oral Tube size: 7.0 mm Number of attempts: 1 Airway Equipment and Method: Stylet Placement Confirmation: ETT inserted through vocal cords under direct vision,  positive ETCO2,  CO2 detector and breath sounds checked- equal and bilateral Secured at: 21 cm Tube secured with: Tape Dental Injury: Teeth and Oropharynx as per pre-operative assessment

## 2018-12-14 NOTE — Anesthesia Preprocedure Evaluation (Addendum)
Anesthesia Evaluation  Patient identified by MRN, date of birth, ID band Patient confused    Reviewed: Allergy & Precautions, NPO status , Patient's Chart, lab work & pertinent test results  History of Anesthesia Complications Negative for: history of anesthetic complications  Airway Mallampati: III  TM Distance: >3 FB     Dental  (+) Dental Advisory Given   Pulmonary neg pulmonary ROS,    breath sounds clear to auscultation       Cardiovascular hypertension, Pt. on home beta blockers and Pt. on medications + dysrhythmias Atrial Fibrillation  Rhythm:Irregular Rate:Normal     Neuro/Psych PSYCHIATRIC DISORDERS Dementia negative neurological ROS     GI/Hepatic negative GI ROS, Neg liver ROS,   Endo/Other  negative endocrine ROS  Renal/GU negative Renal ROS     Musculoskeletal negative musculoskeletal ROS (+)   Abdominal   Peds  Hematology negative hematology ROS (+)   Anesthesia Other Findings   Reproductive/Obstetrics                           Anesthesia Physical Anesthesia Plan  ASA: III  Anesthesia Plan: General   Post-op Pain Management:    Induction: Intravenous and Rapid sequence  PONV Risk Score and Plan: 3 and Treatment may vary due to age or medical condition and Ondansetron  Airway Management Planned: Oral ETT  Additional Equipment: None  Intra-op Plan:   Post-operative Plan: Extubation in OR  Informed Consent: I have reviewed the patients History and Physical, chart, labs and discussed the procedure including the risks, benefits and alternatives for the proposed anesthesia with the patient or authorized representative who has indicated his/her understanding and acceptance.     Dental advisory given and Consent reviewed with POA  Plan Discussed with: CRNA and Anesthesiologist  Anesthesia Plan Comments:        Anesthesia Quick Evaluation

## 2018-12-14 NOTE — Consult Note (Signed)
ORTHOPAEDIC CONSULTATION  REQUESTING PHYSICIAN: Jonah Blue, MD  PCP:  [name], MD  Chief Complaint: Right hip pain  HPI: Amy Randall is a 85 y.o. female who complains of Right hip pain following a unwitnessed fall.  She does live in the dependent but does have dementia at baseline.  Her children help provide excellent care for her in a close proximity.  It is estimated that she was down for a few hours.  She was found down and referred to the emergency department.  While in the emergency department she was noted to have a displaced intertrochanteric fracture of the right hip.  She was also noted to be hyperkalemic.  Otherwise, she has since had her fluid resuscitation labs have normalized.  She does have atrial fibrillation at baseline but is not anticoagulated.  She does not walk with the aid of any assistive devices.  She is independent with ADLs.  The HPI was obtained via chart review, discussion with the consulting physicians, and direct consultation and discussion with the patient's daughter Laurence Spates.  Past Medical History:  Diagnosis Date   Atrial fibrillation (HCC)    Dementia (HCC)    Hypertension    History reviewed. No pertinent surgical history. Social History   Socioeconomic History   Marital status: Widowed    Spouse name: Not on file   Number of children: Not on file   Years of education: Not on file   Highest education level: Not on file  Occupational History   Not on file  Social Needs   Financial resource strain: Not on file   Food insecurity:    Worry: Not on file    Inability: Not on file   Transportation needs:    Medical: Not on file    Non-medical: Not on file  Tobacco Use   Smoking status: Never Smoker   Smokeless tobacco: Never Used  Substance and Sexual Activity   Alcohol use: Not on file   Drug use: Not on file   Sexual activity: Not on file  Lifestyle   Physical activity:    Days per week: Not on  file    Minutes per session: Not on file   Stress: Not on file  Relationships   Social connections:    Talks on phone: Not on file    Gets together: Not on file    Attends religious service: Not on file    Active member of club or organization: Not on file    Attends meetings of clubs or organizations: Not on file    Relationship status: Not on file  Other Topics Concern   Not on file  Social History Narrative   Not on file   Family History  Problem Relation Age of Onset   Hypertension Father    Stroke Father    Diabetes Maternal Grandmother    Allergies  Allergen Reactions   Bactrim [Sulfamethoxazole-Trimethoprim] Other (See Comments)    Loss of memory   Ibuprofen Other (See Comments)    Causes bruising   Penicillins Rash    Has patient had a PCN reaction causing immediate rash, facial/tongue/throat swelling, SOB or lightheadedness with hypotension: Yes Has patient had a PCN reaction causing severe rash involving mucus membranes or skin necrosis: No Has patient had a PCN reaction that required hospitalization: No Has patient had a PCN reaction occurring within the last 10 years: No If all of the above answers are "NO", then may proceed with Cephalosporin use.  Prior to Admission medications   Medication Sig Start Date End Date Taking? Authorizing Provider  acetaminophen (TYLENOL) 500 MG tablet Take 1,000 mg by mouth every 6 (six) hours as needed for mild pain (patient takes every extended release everyday at 2 p).    Yes [provider]  Cholecalciferol (VITAMIN D3) 50 MCG (2000 UT) TABS Take 2,000 Units by mouth daily.    Yes [provider]  diltiazem (CARDIZEM CD) 300 MG 24 hr capsule Take 1 capsule (300 mg total) by mouth daily. 10/07/13  Yes Runell GessBerry, Jonathan J, MD  metoprolol tartrate (LOPRESSOR) 25 MG tablet Take 25 mg by mouth 2 (two) times daily.   Yes [provider]  potassium chloride (K-DUR) 10 MEQ tablet Take 10 mEq by mouth  daily.   Yes [provider]  rivastigmine (EXELON) 9.5 mg/24hr Place 9.5 mg onto the skin daily.   Yes [provider]  sertraline (ZOLOFT) 25 MG tablet Take 12.5 mg by mouth daily.   Yes [provider]   Dg Elbow 2 Views Left  Result Date: 12/14/2018 CLINICAL DATA:  Fall. Elbow laceration. EXAM: LEFT ELBOW - 2 VIEW COMPARISON:  None. FINDINGS: An IV catheter is noted in the antecubital region. Elevation of the anterior fat pad is compatible with a small elbow joint effusion. No acute fracture or dislocation is identified. IMPRESSION: Small elbow joint effusion without an acute fracture evident. Electronically Signed   By: Sebastian AcheAllen  Grady M.D.   On: 12/14/2018 08:29   Ct Head Wo Contrast  Result Date: 12/14/2018 CLINICAL DATA:  Found on ground this morning. EXAM: CT HEAD WITHOUT CONTRAST CT CERVICAL SPINE WITHOUT CONTRAST TECHNIQUE: Multidetector CT imaging of the head and cervical spine was performed following the standard protocol without intravenous contrast. Multiplanar CT image reconstructions of the cervical spine were also generated. COMPARISON:  None. FINDINGS: CT HEAD FINDINGS Brain: There is no evidence of acute infarct, intracranial hemorrhage, mass, midline shift, or extra-axial fluid collection. Small chronic infarcts are present in the bilateral basal ganglia, left cerebellum, and potentially thalami. Confluent cerebral white matter hypodensities are nonspecific but compatible with moderate to severe chronic small vessel ischemic disease. There is moderately advanced cerebral atrophy. Vascular: Calcified atherosclerosis at the skull base. No hyperdense vessel. Skull: No fracture or focal osseous lesion. Sinuses/Orbits: Visualized paranasal sinuses and mastoid air cells are clear. Visualized orbits are unremarkable. Other: None. CT CERVICAL SPINE FINDINGS Alignment: Mild reversal of the normal cervical lordosis. Minimal anterolisthesis of C2 on C3, C4 on C5, and C7 on  T1. Skull base and vertebrae: No acute fracture or suspicious osseous lesion. Mild C1-2 arthropathy. Soft tissues and spinal canal: No prevertebral fluid or swelling. No visible canal hematoma. Disc levels: Disc degeneration most advanced at C3-4 where there is degenerative interbody ankylosis. Moderately advanced disc degeneration at C6-7. Moderate cervical facet arthrosis. Moderate left neural foraminal stenosis at C5-6 and C6-7. Upper chest: Clear lung apices. Other: Moderate enlargement of the left greater than right thyroid lobes with multiple underlying nodules and with left-sided thyroid extension into the superior mediastinum, incompletely imaged. Associated rightward tracheal deviation without significant airway narrowing. Mild calcified atherosclerosis at the carotid bifurcations. IMPRESSION: 1. No evidence of acute intracranial abnormality. 2. Moderate to severe chronic small vessel ischemic disease. 3. No evidence of acute cervical spine fracture. 4. Multinodular thyroid goiter. Electronically Signed   By: Sebastian AcheAllen  Grady M.D.   On: 12/14/2018 08:19   Ct Cervical Spine Wo Contrast  Result Date: 12/14/2018 CLINICAL DATA:  Found on ground this morning. EXAM: CT HEAD WITHOUT CONTRAST CT CERVICAL SPINE WITHOUT CONTRAST TECHNIQUE: Multidetector CT imaging of the head and cervical spine was performed following the standard protocol without intravenous contrast. Multiplanar CT image reconstructions of the cervical spine were also generated. COMPARISON:  None. FINDINGS: CT HEAD FINDINGS Brain: There is no evidence of acute infarct, intracranial hemorrhage, mass, midline shift, or extra-axial fluid collection. Small chronic infarcts are present in the bilateral basal ganglia, left cerebellum, and potentially thalami. Confluent cerebral white matter hypodensities are nonspecific but compatible with moderate to severe chronic small vessel ischemic disease. There is moderately advanced cerebral atrophy. Vascular:  Calcified atherosclerosis at the skull base. No hyperdense vessel. Skull: No fracture or focal osseous lesion. Sinuses/Orbits: Visualized paranasal sinuses and mastoid air cells are clear. Visualized orbits are unremarkable. Other: None. CT CERVICAL SPINE FINDINGS Alignment: Mild reversal of the normal cervical lordosis. Minimal anterolisthesis of C2 on C3, C4 on C5, and C7 on T1. Skull base and vertebrae: No acute fracture or suspicious osseous lesion. Mild C1-2 arthropathy. Soft tissues and spinal canal: No prevertebral fluid or swelling. No visible canal hematoma. Disc levels: Disc degeneration most advanced at C3-4 where there is degenerative interbody ankylosis. Moderately advanced disc degeneration at C6-7. Moderate cervical facet arthrosis. Moderate left neural foraminal stenosis at C5-6 and C6-7. Upper chest: Clear lung apices. Other: Moderate enlargement of the left greater than right thyroid lobes with multiple underlying nodules and with left-sided thyroid extension into the superior mediastinum, incompletely imaged. Associated rightward tracheal deviation without significant airway narrowing. Mild calcified atherosclerosis at the carotid bifurcations. IMPRESSION: 1. No evidence of acute intracranial abnormality. 2. Moderate to severe chronic small vessel ischemic disease. 3. No evidence of acute cervical spine fracture. 4. Multinodular thyroid goiter. Electronically Signed   By: Sebastian Ache M.D.   On: 12/14/2018 08:19   Dg Pelvis Portable  Result Date: 12/14/2018 CLINICAL DATA:  Unwitnessed fall.  Right hip deformity. EXAM: PORTABLE PELVIS 1-2 VIEWS COMPARISON:  None. FINDINGS: Severely angulated and displaced fracture is seen involving the intertrochanteric region of the proximal right femur. Left hip appears normal. IMPRESSION: Severely angulated and displaced intertrochanteric fracture of proximal right femur. Electronically Signed   By: Lupita Raider M.D.   On: 12/14/2018 08:15   Dg Chest  Portable 1 View  Result Date: 12/14/2018 CLINICAL DATA:  Unwitnessed fall. EXAM: PORTABLE CHEST 1 VIEW COMPARISON:  None. FINDINGS: Mild cardiomegaly is noted. No pneumothorax or pleural effusion is noted. Mild bibasilar subsegmental atelectasis or infiltrates are noted. The visualized skeletal structures are unremarkable. IMPRESSION: Mild bibasilar subsegmental atelectasis or infiltrates. Electronically Signed   By: Lupita Raider M.D.   On: 12/14/2018 08:14   Dg Femur Min 2 Views Right  Result Date: 12/14/2018 CLINICAL DATA:  Femur fracture. EXAM: RIGHT FEMUR 2 VIEWS COMPARISON:  None. FINDINGS: Severely displaced and comminuted fracture is seen involving the intertrochanteric region of the proximal right femur. Vascular calcifications are noted. Severe degenerative joint disease of the right knee is noted. IMPRESSION: Comminuted and severely displaced intertrochanteric fracture of proximal right femur is noted. Electronically Signed   By: Lupita Raider M.D.   On: 12/14/2018 08:21    Positive ROS: All other systems have been reviewed and were otherwise negative with the exception of those mentioned in the HPI and as above.  Physical Exam: General:  no acute distress Cardiovascular: No pedal edema Respiratory: No cyanosis, no use of accessory musculature GI: No organomegaly, abdomen is soft  and non-tender Skin: No lesions in the area of chief complaint Neurologic: Sensation intact distally Psychiatric: Patient is NOT competent for consent  Lymphatic: No axillary or cervical lymphadenopathy  MUSCULOSKELETAL:  Right lower extremity is held in external rotation and is shortened compared to the left.  Otherwise 2+ dorsalis pedis pulse.  She moves spontaneously but no focal or logic exam is obtained Due to her mental status.  Assessment: Right hip intertrochanteric fracture, closed.  Plan: - This is an operative injury.  I have had a conversation with the patient's healthcare power of  attorney her daughter, and the niece Whitmir.   - We have discussed my recommendation for intramedullary nail of the right hip.  We discussed the risks, benefits, and indications of this procedure at length including but not limited to bleeding, infection, damage to surrounding neurovascular structures, death, hardware failure, need for further surgery, development of blood clots, and the risk of anesthesia.  She has provided informed consent via the phone.  - Ms. Hulme will be returned to the hospitalist service postoperative before routine perioperative care.  I certainly appreciate the excellent care provided by Dr. Ophelia Charter and her team.    Yolonda Kida, MD Cell [phone]    12/14/2018 12:10 PM

## 2018-12-14 NOTE — Progress Notes (Signed)
Received patient from PACU. Patient alert to self, does not follow commands. Dressing clean sry and intact. Will continue to monitor.

## 2018-12-14 NOTE — ED Notes (Signed)
Called and spoke with daughter [name] [phone] med list updated.

## 2018-12-14 NOTE — Anesthesia Postprocedure Evaluation (Signed)
Anesthesia Post Note  Patient: Amy Randall  Procedure(s) Performed: INTRAMEDULLARY (IM) NAIL INTERTROCHANTRIC (Right )     Patient location during evaluation: PACU Anesthesia Type: General Level of consciousness: awake and alert Pain management: pain level controlled Vital Signs Assessment: post-procedure vital signs reviewed and stable Respiratory status: spontaneous breathing, nonlabored ventilation and respiratory function stable Cardiovascular status: blood pressure returned to baseline and stable Postop Assessment: no apparent nausea or vomiting Anesthetic complications: no    Last Vitals:  Vitals:   12/14/18 1436 12/14/18 1448  BP:  134/87  Pulse: 94 82  Resp: 20 16  Temp: (!) 36.1 C 36.6 C  SpO2: 95%     Last Pain:  Vitals:   12/14/18 1448  TempSrc: Oral  PainSc:                  [name]

## 2018-12-14 NOTE — Transfer of Care (Signed)
Immediate Anesthesia Transfer of Care Note  Patient: Amy Randall  Procedure(s) Performed: INTRAMEDULLARY (IM) NAIL INTERTROCHANTRIC (Right )  Patient Location: PACU  Anesthesia Type:General  Level of Consciousness: awake and confused  Airway & Oxygen Therapy: Patient Spontanous Breathing and Patient connected to nasal cannula oxygen  Post-op Assessment: Report given to RN and Post -op Vital signs reviewed and stable  Post vital signs: Reviewed and stable  Last Vitals:  Vitals Value Taken Time  BP 141/73 12/14/2018  2:11 PM  Temp    Pulse 87 12/14/2018  2:10 PM  Resp 25 12/14/2018  2:11 PM  SpO2 100 % 12/14/2018  2:10 PM  Vitals shown include unvalidated device data.  Last Pain:  Vitals:   12/14/18 1009  TempSrc: Oral  PainSc:          Complications: No apparent anesthesia complications

## 2018-12-14 NOTE — Progress Notes (Signed)
Received patient from Ed. Patient alert to self only (at times). OR called to take patient to surgery. Report called to short stay.   Patient's K is to high, patient brought back to [facility]. Will repeat K and re-evaluate if patient will be taken to surgery today.

## 2018-12-14 NOTE — Brief Op Note (Signed)
12/14/2018  2:20 PM  PATIENT:  Lysle Dingwall  85 y.o. female  PRE-OPERATIVE DIAGNOSIS:  right hip fracture  POST-OPERATIVE DIAGNOSIS:  right hip fracture  PROCEDURE:  Procedure(s): INTRAMEDULLARY (IM) NAIL INTERTROCHANTRIC (Right)  SURGEON:  Surgeon(s) and Role:    * Yolonda Kida, MD - Primary  PHYSICIAN ASSISTANT:   ASSISTANTS: none   ANESTHESIA:   general  EBL:  75 mL   BLOOD ADMINISTERED:none  DRAINS: none   LOCAL MEDICATIONS USED:  NONE  SPECIMEN:  No Specimen  DISPOSITION OF SPECIMEN:  N/A  COUNTS:  YES  TOURNIQUET:  * No tourniquets in log *  DICTATION: .Note written in EPIC  PLAN OF CARE: Admit to inpatient   PATIENT DISPOSITION:  PACU - hemodynamically stable.   Delay start of Pharmacological VTE agent (>24hrs) due to surgical blood loss or risk of bleeding: not applicable

## 2018-12-14 NOTE — Progress Notes (Signed)
Orthopedic Tech Progress Note Patient Details:  Amy Randall [birth year] [id number] Applied over head frame for patient Patient ID: Amy Randall, female   DOB: Mar 04, 1934, 85 y.o.   MRN: [id number]   Amy Randall 12/14/2018, 11:04 AM

## 2018-12-14 NOTE — Op Note (Signed)
Date of Surgery: 12/14/2018  INDICATIONS: Ms. Dan HumphreysWalker is a 85 y.o.-year-old female who sustained a right hip fracture. The risks and benefits of the procedure discussed with the family and health care POA, Laurence SpatesDenise Whitmir, her daughter prior to the procedure and all questions were answered; consent was obtained.  PREOPERATIVE DIAGNOSIS: right hip fracture   POSTOPERATIVE DIAGNOSIS: Same   PROCEDURE: Treatment of intertrochanteric, pertrochanteric, subtrochanteric fracture with intramedullary implant. CPT [id number]   SURGEON: Kathi DerJason P. Aundria Rudogers, M.D.   ANESTHESIA: general   IV FLUIDS AND URINE: See anesthesia record   ESTIMATED BLOOD LOSS: 75 cc  IMPLANTS:   Synthes TFNA 10 x 235 mm 85 mm compression screw 32 mm x 5.0 mm distal screw  DRAINS: None.   COMPLICATIONS: None.   DESCRIPTION OF PROCEDURE: The patient was brought to the operating room and placed supine on the operating table. The patient's leg had been signed prior to the procedure. The patient had the anesthesia placed by the anesthesiologist. The prep verification and incision time-outs were performed to confirm that this was the correct patient, site, side and location. The patient had an SCD on the opposite lower extremity. The patient did receive antibiotics prior to the incision and was re-dosed during the procedure as needed at indicated intervals. The patient was positioned on the fracture table with the table in traction and internal rotation to reduce the hip. The well leg was placed in a scissor position and all bony prominences were well-padded. The patient had the lower extremity prepped and draped in the standard surgical fashion. The incision was made 4 finger breadths superior to the greater trochanter. A guide pin was inserted into the tip of the greater trochanter under fluoroscopic guidance. An opening reamer was used to gain access to the femoral canal. The nail length was measured and inserted down the femoral canal  to its proper depth. The appropriate version of insertion for the lag screw was found under fluoroscopy. A pin was inserted up the femoral neck through the jig. The length of the lag screw was then measured. The lag screw was inserted as near to center-center in the head as possible. The leg was taken out of traction, then the compression screw was used to compress across the fracture. Compression was visualized on serial xrays.   We next turned our attention to the distal interlocking screw.  This was placed through the drill guide of the nail inserter.  A small incision was made overlying the lateral thigh at the screw site, and a tonsil was used to disect down to bone.  A drill pass was made through the jig and across the nail through both cortices.  This was measured, and the appropriate screw was placed under hand power and found to have good bite.    The wound was copiously irrigated with saline and the subcutaneous layer closed with 2.0 monocryl and the skin was reapproximated with staples. The wounds were cleaned and dried a final time and a sterile dressing was placed. The hip was taken through a range of motion at the end of the case under fluoroscopic imaging to visualize the approach-withdraw phenomenon and confirm implant length in the head. The patient was then awakened from anesthesia and taken to the recovery room in stable condition. All counts were correct at the end of the case.   POSTOPERATIVE PLAN: The patient will be weight bearing as tolerated and will return in 2 weeks for staple removal and the patient will receive  DVT prophylaxis based on other medications, activity level, and risk ratio of bleeding to thrombosis.     Maryan Rued, MD [facility] [place] [phone] 2:21 PM

## 2018-12-14 NOTE — ED Notes (Addendum)
ED TO INPATIENT HANDOFF REPORT  ED Nurse Name and Phone #: Dorcas Carrow [phone]  S Name/Age/Gender Amy Randall 85 y.o. female Room/Bed: 016C/016C  Code Status   Code Status: Not on file  Home/SNF/Other Home Patient oriented to: self Is this baseline? Yes   Triage Complete: Triage complete  Chief Complaint fall  Triage Note Per EMS: pt found outside on ground by passerby.  Unknown down time.  Pt has dementia and lives home alone. Pt has obvious deformity to right hip with shortening and rotation.  EMS administered 50 mcg of fentanyl PTA.    EMS vitals:   BP 142/42 CBG 218 Sp02 95% RA  Temp 95.2-98.2 HR - Irregular with history of AFIb   Allergies Allergies  Allergen Reactions  . Bactrim [Sulfamethoxazole-Trimethoprim] Other (See Comments)    Loss of memory  . Ibuprofen Other (See Comments)    Causes bruising  . Penicillins Rash    Has patient had a PCN reaction causing immediate rash, facial/tongue/throat swelling, SOB or lightheadedness with hypotension: Yes Has patient had a PCN reaction causing severe rash involving mucus membranes or skin necrosis: No Has patient had a PCN reaction that required hospitalization: No Has patient had a PCN reaction occurring within the last 10 years: No If all of the above answers are "NO", then may proceed with Cephalosporin use.     Level of Care/Admitting Diagnosis ED Disposition    ED Disposition Condition Comment   Admit  Hospital Area: [facility] [[id number]]  Level of Care: Med-Surg [16]  Covid Evaluation: Screening Protocol (No Symptoms)  Diagnosis: Hip fracture [facility]) [[id number]]  Admitting Physician: Jonah Blue [[id number]]  Attending Physician: Jonah Blue [[id number]]  Estimated length of stay: 3 - 4 days  Certification:: I certify this patient will need inpatient services for at least 2 midnights  PT Class (Do Not Modify): Inpatient [101]  PT Acc Code (Do Not Modify): Private [1]        B Medical/Surgery History Past Medical History:  Diagnosis Date  . Atrial fibrillation (HCC)   . Dementia (HCC)   . Hypertension    History reviewed. No pertinent surgical history.   A IV Location/Drains/Wounds Patient Lines/Drains/Airways Status   Active Line/Drains/Airways    Name:   Placement date:   Placement time:   Site:   Days:   Peripheral IV 12/14/18 Left Antecubital   12/14/18    0723    Antecubital   less than 1          Intake/Output Last 24 hours No intake or output data in the 24 hours ending 12/14/18 1191  Labs/Imaging Results for orders placed or performed during the hospital encounter of 12/14/18 (from the past 48 hour(s))  Basic metabolic panel     Status: Abnormal   Collection Time: 12/14/18  7:19 AM  Result Value Ref Range   Sodium 139 135 - 145 mmol/L   Potassium 5.6 (H) 3.5 - 5.1 mmol/L    Comment: HEMOLYSIS AT THIS LEVEL MAY AFFECT RESULT   Chloride 105 98 - 111 mmol/L   CO2 22 22 - 32 mmol/L   Glucose, Bld 197 (H) 70 - 99 mg/dL   BUN 21 8 - 23 mg/dL   Creatinine, Ser 4.78 (H) 0.44 - 1.00 mg/dL   Calcium 8.7 (L) 8.9 - 10.3 mg/dL   GFR calc non Af Amer 49 (L) >60 mL/min   GFR calc Af Amer 57 (L) >60 mL/min   Anion gap 12 5 -  15    Comment: Performed at [facility], [address] [place], [place] [address]  CBC WITH DIFFERENTIAL     Status: Abnormal   Collection Time: 12/14/18  7:19 AM  Result Value Ref Range   WBC 11.1 (H) 4.0 - 10.5 K/uL   RBC 4.05 3.87 - 5.11 MIL/uL   Hemoglobin 11.9 (L) 12.0 - 15.0 g/dL   HCT 91.4 78.2 - 95.6 %   MCV 91.4 80.0 - 100.0 fL   MCH 29.4 26.0 - 34.0 pg   MCHC 32.2 30.0 - 36.0 g/dL   RDW 21.3 08.6 - 57.8 %   Platelets 167 150 - 400 K/uL   nRBC 0.0 0.0 - 0.2 %   Neutrophils Relative % 83 %   Neutro Abs 9.2 (H) 1.7 - 7.7 K/uL   Lymphocytes Relative 8 %   Lymphs Abs 0.9 0.7 - 4.0 K/uL   Monocytes Relative 6 %   Monocytes Absolute 0.7 0.1 - 1.0 K/uL   Eosinophils Relative 1 %   Eosinophils  Absolute 0.2 0.0 - 0.5 K/uL   Basophils Relative 1 %   Basophils Absolute 0.1 0.0 - 0.1 K/uL   Immature Granulocytes 1 %   Abs Immature Granulocytes 0.06 0.00 - 0.07 K/uL    Comment: Performed at [facility], [address], [place], [place] [address]  SARS Coronavirus 2 (CEPHEID - Performed in [facility] hospital lab), Hosp Order     Status: None   Collection Time: 12/14/18  7:32 AM  Result Value Ref Range   SARS Coronavirus 2 NEGATIVE NEGATIVE    Comment: (NOTE) If result is NEGATIVE SARS-CoV-2 target nucleic acids are NOT DETECTED. The SARS-CoV-2 RNA is generally detectable in upper and lower  respiratory specimens during the acute phase of infection. The lowest  concentration of SARS-CoV-2 viral copies this assay can detect is 250  copies / mL. A negative result does not preclude SARS-CoV-2 infection  and should not be used as the sole basis for treatment or other  patient management decisions.  A negative result may occur with  improper specimen collection / handling, submission of specimen other  than nasopharyngeal swab, presence of viral mutation(s) within the  areas targeted by this assay, and inadequate number of viral copies  (<250 copies / mL). A negative result must be combined with clinical  observations, patient history, and epidemiological information. If result is POSITIVE SARS-CoV-2 target nucleic acids are DETECTED. The SARS-CoV-2 RNA is generally detectable in upper and lower  respiratory specimens dur ing the acute phase of infection.  Positive  results are indicative of active infection with SARS-CoV-2.  Clinical  correlation with patient history and other diagnostic information is  necessary to determine patient infection status.  Positive results do  not rule out bacterial infection or co-infection with other viruses. If result is PRESUMPTIVE POSTIVE SARS-CoV-2 nucleic acids MAY BE PRESENT.   A presumptive positive result was obtained on the  submitted specimen  and confirmed on repeat testing.  While 2019 novel coronavirus  (SARS-CoV-2) nucleic acids may be present in the submitted sample  additional confirmatory testing may be necessary for epidemiological  and / or clinical management purposes  to differentiate between  SARS-CoV-2 and other Sarbecovirus currently known to infect humans.  If clinically indicated additional testing with an alternate test  methodology [phone]) is advised. The SARS-CoV-2 RNA is generally  detectable in upper and lower respiratory sp ecimens during the acute  phase of infection. The  expected result is Negative. Fact Sheet for Patients:  BoilerBrush.com.cy Fact Sheet for Healthcare Providers: https://pope.com/ This test is not yet approved or cleared by the [place] FDA and has been authorized for detection and/or diagnosis of SARS-CoV-2 by FDA under an Emergency Use Authorization (EUA).  This EUA will remain in effect (meaning this test can be used) for the duration of the COVID-19 declaration under Section 564(b)(1) of the Act, 21 U.S.C. section 360bbb-3(b)(1), unless the authorization is terminated or revoked sooner. Performed at [facility], [address] [place], [place] [address]   Type and screen [facility]     Status: None   Collection Time: 12/14/18  7:36 AM  Result Value Ref Range   ABO/RH(D) O POS    Antibody Screen NEG    Sample Expiration      12/17/2018,2359 Performed at [facility], [address], [place], [place] [address]   ABO/Rh     Status: None (Preliminary result)   Collection Time: 12/14/18  7:36 AM  Result Value Ref Range   ABO/RH(D)      O POS Performed at [facility], [address], [place], [place] [address]   Protime-INR     Status: None   Collection Time: 12/14/18  8:58 AM  Result Value Ref Range   Prothrombin Time 14.5 11.4 - 15.2 seconds   INR 1.1 0.8 -  1.2    Comment: (NOTE) INR goal varies based on device and disease states. Performed at [facility], [address], [place], [place] [address]    Dg Elbow 2 Views Left  Result Date: 12/14/2018 CLINICAL DATA:  Fall. Elbow laceration. EXAM: LEFT ELBOW - 2 VIEW COMPARISON:  None. FINDINGS: An IV catheter is noted in the antecubital region. Elevation of the anterior fat pad is compatible with a small elbow joint effusion. No acute fracture or dislocation is identified. IMPRESSION: Small elbow joint effusion without an acute fracture evident. Electronically Signed   By: Sebastian Ache M.D.   On: 12/14/2018 08:29   Ct Head Wo Contrast  Result Date: 12/14/2018 CLINICAL DATA:  Found on ground this morning. EXAM: CT HEAD WITHOUT CONTRAST CT CERVICAL SPINE WITHOUT CONTRAST TECHNIQUE: Multidetector CT imaging of the head and cervical spine was performed following the standard protocol without intravenous contrast. Multiplanar CT image reconstructions of the cervical spine were also generated. COMPARISON:  None. FINDINGS: CT HEAD FINDINGS Brain: There is no evidence of acute infarct, intracranial hemorrhage, mass, midline shift, or extra-axial fluid collection. Small chronic infarcts are present in the bilateral basal ganglia, left cerebellum, and potentially thalami. Confluent cerebral white matter hypodensities are nonspecific but compatible with moderate to severe chronic small vessel ischemic disease. There is moderately advanced cerebral atrophy. Vascular: Calcified atherosclerosis at the skull base. No hyperdense vessel. Skull: No fracture or focal osseous lesion. Sinuses/Orbits: Visualized paranasal sinuses and mastoid air cells are clear. Visualized orbits are unremarkable. Other: None. CT CERVICAL SPINE FINDINGS Alignment: Mild reversal of the normal cervical lordosis. Minimal anterolisthesis of C2 on C3, C4 on C5, and C7 on T1. Skull base and vertebrae: No acute fracture or suspicious osseous lesion.  Mild C1-2 arthropathy. Soft tissues and spinal canal: No prevertebral fluid or swelling. No visible canal hematoma. Disc levels: Disc degeneration most advanced at C3-4 where there is degenerative interbody ankylosis. Moderately advanced disc degeneration at C6-7. Moderate cervical facet arthrosis. Moderate left neural foraminal stenosis at C5-6 and C6-7. Upper chest: Clear  lung apices. Other: Moderate enlargement of the left greater than right thyroid lobes with multiple underlying nodules and with left-sided thyroid extension into the superior mediastinum, incompletely imaged. Associated rightward tracheal deviation without significant airway narrowing. Mild calcified atherosclerosis at the carotid bifurcations. IMPRESSION: 1. No evidence of acute intracranial abnormality. 2. Moderate to severe chronic small vessel ischemic disease. 3. No evidence of acute cervical spine fracture. 4. Multinodular thyroid goiter. Electronically Signed   By: Sebastian Ache M.D.   On: 12/14/2018 08:19   Ct Cervical Spine Wo Contrast  Result Date: 12/14/2018 CLINICAL DATA:  Found on ground this morning. EXAM: CT HEAD WITHOUT CONTRAST CT CERVICAL SPINE WITHOUT CONTRAST TECHNIQUE: Multidetector CT imaging of the head and cervical spine was performed following the standard protocol without intravenous contrast. Multiplanar CT image reconstructions of the cervical spine were also generated. COMPARISON:  None. FINDINGS: CT HEAD FINDINGS Brain: There is no evidence of acute infarct, intracranial hemorrhage, mass, midline shift, or extra-axial fluid collection. Small chronic infarcts are present in the bilateral basal ganglia, left cerebellum, and potentially thalami. Confluent cerebral white matter hypodensities are nonspecific but compatible with moderate to severe chronic small vessel ischemic disease. There is moderately advanced cerebral atrophy. Vascular: Calcified atherosclerosis at the skull base. No hyperdense vessel. Skull: No  fracture or focal osseous lesion. Sinuses/Orbits: Visualized paranasal sinuses and mastoid air cells are clear. Visualized orbits are unremarkable. Other: None. CT CERVICAL SPINE FINDINGS Alignment: Mild reversal of the normal cervical lordosis. Minimal anterolisthesis of C2 on C3, C4 on C5, and C7 on T1. Skull base and vertebrae: No acute fracture or suspicious osseous lesion. Mild C1-2 arthropathy. Soft tissues and spinal canal: No prevertebral fluid or swelling. No visible canal hematoma. Disc levels: Disc degeneration most advanced at C3-4 where there is degenerative interbody ankylosis. Moderately advanced disc degeneration at C6-7. Moderate cervical facet arthrosis. Moderate left neural foraminal stenosis at C5-6 and C6-7. Upper chest: Clear lung apices. Other: Moderate enlargement of the left greater than right thyroid lobes with multiple underlying nodules and with left-sided thyroid extension into the superior mediastinum, incompletely imaged. Associated rightward tracheal deviation without significant airway narrowing. Mild calcified atherosclerosis at the carotid bifurcations. IMPRESSION: 1. No evidence of acute intracranial abnormality. 2. Moderate to severe chronic small vessel ischemic disease. 3. No evidence of acute cervical spine fracture. 4. Multinodular thyroid goiter. Electronically Signed   By: Sebastian Ache M.D.   On: 12/14/2018 08:19   Dg Pelvis Portable  Result Date: 12/14/2018 CLINICAL DATA:  Unwitnessed fall.  Right hip deformity. EXAM: PORTABLE PELVIS 1-2 VIEWS COMPARISON:  None. FINDINGS: Severely angulated and displaced fracture is seen involving the intertrochanteric region of the proximal right femur. Left hip appears normal. IMPRESSION: Severely angulated and displaced intertrochanteric fracture of proximal right femur. Electronically Signed   By: Lupita Raider M.D.   On: 12/14/2018 08:15   Dg Chest Portable 1 View  Result Date: 12/14/2018 CLINICAL DATA:  Unwitnessed fall.  EXAM: PORTABLE CHEST 1 VIEW COMPARISON:  None. FINDINGS: Mild cardiomegaly is noted. No pneumothorax or pleural effusion is noted. Mild bibasilar subsegmental atelectasis or infiltrates are noted. The visualized skeletal structures are unremarkable. IMPRESSION: Mild bibasilar subsegmental atelectasis or infiltrates. Electronically Signed   By: Lupita Raider M.D.   On: 12/14/2018 08:14   Dg Femur Min 2 Views Right  Result Date: 12/14/2018 CLINICAL DATA:  Femur fracture. EXAM: RIGHT FEMUR 2 VIEWS COMPARISON:  None. FINDINGS: Severely displaced and comminuted fracture is seen involving the intertrochanteric region  of the proximal right femur. Vascular calcifications are noted. Severe degenerative joint disease of the right knee is noted. IMPRESSION: Comminuted and severely displaced intertrochanteric fracture of proximal right femur is noted. Electronically Signed   By: Lupita RaiderJames  Green Jr M.D.   On: 12/14/2018 08:21    Pending Labs Unresulted Labs (From admission, onward)    Start     Ordered   12/14/18 0738  Urinalysis, Routine w reflex microscopic  Once,   R     12/14/18 0737          Vitals/Pain Today's Vitals   12/14/18 0745 12/14/18 0845 12/14/18 0900 12/14/18 0930  BP: 123/84  123/73 125/69  Pulse: (!) 105 67 71 69  Resp: (!) 31 14 20  (!) 22  Temp:      TempSrc:      SpO2: 94% 95% 97% 95%  PainSc:        Isolation Precautions No active isolations  Medications Medications  fentaNYL (SUBLIMAZE) injection 50 mcg (50 mcg Intravenous Given 12/14/18 0903)  sodium chloride 0.9 % bolus 500 mL (500 mLs Intravenous New Bag/Given 12/14/18 0850)    Mobility non-ambulatory High fall risk   Focused Assessments Cardiac Assessment Handoff:    Lab Results  Component Value Date   CKTOTAL 313 (H) 01/26/2018   TROPONINI 0.07 (HH) 01/27/2018   No results found for: DDIMER Does the Patient currently have chest pain? No     R Recommendations: See Admitting Provider Note  Report given  to:   Additional Notes:   +2  bilateral pedal pulses, ambulatory at baseline, dementia at baseline, ED liaison has updated pt family

## 2018-12-14 NOTE — H&P (Signed)
History and Physical    [place] T Randall WUJ:[id number] DOB: 01-19-1934 DOA: 12/14/2018  PCP: Kari BaarsHawkins, Edward, MD Consultants:  Hilda LiasKeeling - orthopedics Patient coming from:  Home - lives alone; NOK: Daughter, [phone]  Chief Complaint: Fall  HPI: Lysle DingwallVirginia T Randall is a 85 y.o. female with medical history significant of HTN; afib; and dementia presenting with a fall.  Patient has dementia and is not oriented to even person.  I spoke to her daughter, who provided the remaining history.  Her baseline is that she knows her name.  If she is upset, she will say that she does not know her name.  She has anxiety and panic attacks.  Her daughter estimates that she was outside for 1-2 hours in the cold - she might be stressed, hungry.  The family has cameras in the house.  Around 430, she went out the door.  She usually has to hold on to handrails and furniture to ambulate.  Her daughter thinks she fell and crawled.  She was found by neighbors several hours later.  Family is uncertain about where she will need to be placed - her [place] not [place].  [facility] [place] [facility]  The patient has never voiced an opinion about resuscitation.  The daughter will get back to us on code status - she would prefer not to resuscitate but she will be in touch with her brother to make this decision.   ED Course:  Found in the street by neighbors, unknown length of time down.  Ortho consult pending for R hip fracture.    Review of Systems:  Unable to perform  Ambulatory Status:  She has a cane or Uptain but forgets to use them  Past Medical History:  Diagnosis Date   Atrial fibrillation (HCC)    Dementia (HCC)    Hypertension     History reviewed. No pertinent surgical history.  Social History   Socioeconomic History   Marital status: Widowed    Spouse name: Not on file   Number of children: Not on file   Years of education: Not on file   Highest education  level: Not on file  Occupational History   Not on file  Social Needs   Financial resource strain: Not on file   Food insecurity:    Worry: Not on file    Inability: Not on file   Transportation needs:    Medical: Not on file    Non-medical: Not on file  Tobacco Use   Smoking status: Never Smoker   Smokeless tobacco: Never Used  Substance and Sexual Activity   Alcohol use: Not on file   Drug use: Not on file   Sexual activity: Not on file  Lifestyle   Physical activity:    Days per week: Not on file    Minutes per session: Not on file   Stress: Not on file  Relationships   Social connections:    Talks on phone: Not on file    Gets together: Not on file    Attends religious service: Not on file    Active member of club or organization: Not on file    Attends meetings of clubs or organizations: Not on file    Relationship status: Not on file   Intimate partner violence:    Fear of current or ex partner: Not on file    Emotionally abused: Not on file    Physically abused: Not on file  Forced sexual activity: Not on file  Other Topics Concern   Not on file  Social History Narrative   Not on file    Allergies  Allergen Reactions   Bactrim [Sulfamethoxazole-Trimethoprim] Other (See Comments)    Loss of memory   Ibuprofen Other (See Comments)    Causes bruising   Penicillins Rash    Has patient had a PCN reaction causing immediate rash, facial/tongue/throat swelling, SOB or lightheadedness with hypotension: Yes Has patient had a PCN reaction causing severe rash involving mucus membranes or skin necrosis: No Has patient had a PCN reaction that required hospitalization: No Has patient had a PCN reaction occurring within the last 10 years: No If all of the above answers are "NO", then may proceed with Cephalosporin use.     Family History  Problem Relation Age of Onset   Hypertension Father    Stroke Father    Diabetes Maternal Grandmother      Prior to Admission medications   Medication Sig Start Date End Date Taking? Authorizing Provider  acetaminophen (TYLENOL) 500 MG tablet Take 1,000 mg by mouth every 6 (six) hours as needed (patient takes every extended release everyday at 2 p).   Yes [provider]  Cholecalciferol (VITAMIN D3 PO) Take 1 capsule by mouth daily.   Yes [provider]  diltiazem (CARDIZEM CD) 300 MG 24 hr capsule Take 1 capsule (300 mg total) by mouth daily. 10/07/13  Yes Runell Gess, MD  metoprolol tartrate (LOPRESSOR) 25 MG tablet Take 25 mg by mouth 2 (two) times daily.   Yes [provider]  potassium chloride (K-DUR) 10 MEQ tablet Take 10 mEq by mouth daily.   Yes [provider]  rivastigmine (EXELON) 9.5 mg/24hr Place 9.5 mg onto the skin daily.   Yes [provider]  sertraline (ZOLOFT) 25 MG tablet Take 12.5 mg by mouth daily.   Yes [provider]  ibuprofen (ADVIL,MOTRIN) 200 MG tablet Take 200 mg by mouth every 6 (six) hours as needed.    [provider]  lisinopril (PRINIVIL,ZESTRIL) 20 MG tablet Take 1 tablet (20 mg total) by mouth daily. 11/06/13   Croitoru, Mihai, MD  methylPREDNISolone (MEDROL DOSEPAK) 4 MG TBPK tablet Take 4 mg by mouth as directed.  01/23/18   [provider]  PARoxetine (PAXIL) 20 MG tablet Take 20 mg by mouth daily.  01/16/17   [provider]    Physical Exam: Vitals:   12/14/18 0900 12/14/18 0930 12/14/18 0945 12/14/18 1009  BP: 123/73 125/69 117/73 (!) 149/80  Pulse: 71 69 78 79  Resp: 20 (!) 22 (!) 23 (!) 22  Temp:    98.1 F (36.7 C)  TempSrc:    Oral  SpO2: 97% 95% 96% 98%  Weight:    45.8 kg  Height:     (1.549 m)      General:  Appears calm and comfortable and is NAD; alert with one-word nonsensical responses  Eyes:  PERRL, EOMI, normal lids, iris  ENT:  grossly normal hearing, lips & tongue, mmm  Neck:  no LAD, masses or thyromegaly  Cardiovascular:   Irregularly irregular, no m/r/g. No LE edema.   Respiratory:   CTA bilaterally with no wheezes/rales/rhonchi.  Normal respiratory effort.  Abdomen:  soft, NT, ND, NABS  Skin:  no rash or induration seen on limited exam  Musculoskeletal:  No visible gross deformity of the RLE but it is shortened compared to the lefr  Lower extremity:  No LE edema.  Limited foot exam with no ulcerations.  2+ distal pulses.  Psychiatric:  Alert, not oriented  Neurologic:  Unable to perform    Radiological Exams on Admission: Dg Elbow 2 Views Left  Result Date: 12/14/2018 CLINICAL DATA:  Fall. Elbow laceration. EXAM: LEFT ELBOW - 2 VIEW COMPARISON:  None. FINDINGS: An IV catheter is noted in the antecubital region. Elevation of the anterior fat pad is compatible with a small elbow joint effusion. No acute fracture or dislocation is identified. IMPRESSION: Small elbow joint effusion without an acute fracture evident. Electronically Signed   By: Sebastian Ache M.D.   On: 12/14/2018 08:29   Ct Head Wo Contrast  Result Date: 12/14/2018 CLINICAL DATA:  Found on ground this morning. EXAM: CT HEAD WITHOUT CONTRAST CT CERVICAL SPINE WITHOUT CONTRAST TECHNIQUE: Multidetector CT imaging of the head and cervical spine was performed following the standard protocol without intravenous contrast. Multiplanar CT image reconstructions of the cervical spine were also generated. COMPARISON:  None. FINDINGS: CT HEAD FINDINGS Brain: There is no evidence of acute infarct, intracranial hemorrhage, mass, midline shift, or extra-axial fluid collection. Small chronic infarcts are present in the bilateral basal ganglia, left cerebellum, and potentially thalami. Confluent cerebral white matter hypodensities are nonspecific but compatible with moderate to severe chronic small vessel ischemic disease. There is moderately advanced cerebral atrophy. Vascular: Calcified atherosclerosis at the skull base. No hyperdense vessel. Skull: No fracture or  focal osseous lesion. Sinuses/Orbits: Visualized paranasal sinuses and mastoid air cells are clear. Visualized orbits are unremarkable. Other: None. CT CERVICAL SPINE FINDINGS Alignment: Mild reversal of the normal cervical lordosis. Minimal anterolisthesis of C2 on C3, C4 on C5, and C7 on T1. Skull base and vertebrae: No acute fracture or suspicious osseous lesion. Mild C1-2 arthropathy. Soft tissues and spinal canal: No prevertebral fluid or swelling. No visible canal hematoma. Disc levels: Disc degeneration most advanced at C3-4 where there is degenerative interbody ankylosis. Moderately advanced disc degeneration at C6-7. Moderate cervical facet arthrosis. Moderate left neural foraminal stenosis at C5-6 and C6-7. Upper chest: Clear lung apices. Other: Moderate enlargement of the left greater than right thyroid lobes with multiple underlying nodules and with left-sided thyroid extension into the superior mediastinum, incompletely imaged. Associated rightward tracheal deviation without significant airway narrowing. Mild calcified atherosclerosis at the carotid bifurcations. IMPRESSION: 1. No evidence of acute intracranial abnormality. 2. Moderate to severe chronic small vessel ischemic disease. 3. No evidence of acute cervical spine fracture. 4. Multinodular thyroid goiter. Electronically Signed   By: Sebastian Ache M.D.   On: 12/14/2018 08:19   Ct Cervical Spine Wo Contrast  Result Date: 12/14/2018 CLINICAL DATA:  Found on ground this morning. EXAM: CT HEAD WITHOUT CONTRAST CT CERVICAL SPINE WITHOUT CONTRAST TECHNIQUE: Multidetector CT imaging of the head and cervical spine was performed following the standard protocol without intravenous contrast. Multiplanar CT image reconstructions of the cervical spine were also generated. COMPARISON:  None. FINDINGS: CT HEAD FINDINGS Brain: There is no evidence of acute infarct, intracranial hemorrhage, mass, midline shift, or extra-axial fluid collection. Small chronic  infarcts are present in the bilateral basal ganglia, left cerebellum, and potentially thalami. Confluent cerebral white matter hypodensities are nonspecific but compatible with moderate to severe chronic small vessel ischemic disease. There is moderately advanced cerebral atrophy. Vascular: Calcified atherosclerosis at the skull base. No hyperdense vessel. Skull: No fracture or focal osseous lesion. Sinuses/Orbits: Visualized paranasal sinuses and mastoid air cells are clear. Visualized orbits are unremarkable. Other: None. CT CERVICAL  SPINE FINDINGS Alignment: Mild reversal of the normal cervical lordosis. Minimal anterolisthesis of C2 on C3, C4 on C5, and C7 on T1. Skull base and vertebrae: No acute fracture or suspicious osseous lesion. Mild C1-2 arthropathy. Soft tissues and spinal canal: No prevertebral fluid or swelling. No visible canal hematoma. Disc levels: Disc degeneration most advanced at C3-4 where there is degenerative interbody ankylosis. Moderately advanced disc degeneration at C6-7. Moderate cervical facet arthrosis. Moderate left neural foraminal stenosis at C5-6 and C6-7. Upper chest: Clear lung apices. Other: Moderate enlargement of the left greater than right thyroid lobes with multiple underlying nodules and with left-sided thyroid extension into the superior mediastinum, incompletely imaged. Associated rightward tracheal deviation without significant airway narrowing. Mild calcified atherosclerosis at the carotid bifurcations. IMPRESSION: 1. No evidence of acute intracranial abnormality. 2. Moderate to severe chronic small vessel ischemic disease. 3. No evidence of acute cervical spine fracture. 4. Multinodular thyroid goiter. Electronically Signed   By: Sebastian Ache M.D.   On: 12/14/2018 08:19   Dg Pelvis Portable  Result Date: 12/14/2018 CLINICAL DATA:  Unwitnessed fall.  Right hip deformity. EXAM: PORTABLE PELVIS 1-2 VIEWS COMPARISON:  None. FINDINGS: Severely angulated and displaced  fracture is seen involving the intertrochanteric region of the proximal right femur. Left hip appears normal. IMPRESSION: Severely angulated and displaced intertrochanteric fracture of proximal right femur. Electronically Signed   By: Lupita Raider M.D.   On: 12/14/2018 08:15   Dg Chest Portable 1 View  Result Date: 12/14/2018 CLINICAL DATA:  Unwitnessed fall. EXAM: PORTABLE CHEST 1 VIEW COMPARISON:  None. FINDINGS: Mild cardiomegaly is noted. No pneumothorax or pleural effusion is noted. Mild bibasilar subsegmental atelectasis or infiltrates are noted. The visualized skeletal structures are unremarkable. IMPRESSION: Mild bibasilar subsegmental atelectasis or infiltrates. Electronically Signed   By: Lupita Raider M.D.   On: 12/14/2018 08:14   Dg Femur Min 2 Views Right  Result Date: 12/14/2018 CLINICAL DATA:  Femur fracture. EXAM: RIGHT FEMUR 2 VIEWS COMPARISON:  None. FINDINGS: Severely displaced and comminuted fracture is seen involving the intertrochanteric region of the proximal right femur. Vascular calcifications are noted. Severe degenerative joint disease of the right knee is noted. IMPRESSION: Comminuted and severely displaced intertrochanteric fracture of proximal right femur is noted. Electronically Signed   By: Lupita Raider M.D.   On: 12/14/2018 08:21    EKG: Independently reviewed.  Afib with rate 76; nonspecific ST changes with no evidence of acute ischemia   Labs on Admission: I have personally reviewed the available labs and imaging studies at the time of the admission.  Pertinent labs:   K+ 5.6, 3.5 Glucose 197 BUN 21/Creatinine 1.05/GFR 49 WBC 11.1 Hgb 11.9 COVID negative  Assessment/Plan Principal Problem:   Hip fracture (HCC) Active Problems:   Essential hypertension   Dementia without behavioral disturbance (HCC)   AF (paroxysmal atrial fibrillation) (HCC)   Hyperglycemia   Hip fracture -Mechanical fall resulting in hip fracture -Orthopedics  consulted -NPO in anticipation of surgical repair; initial K+ was elevated in the setting of dehydration after she was found down after several hours but this has normalized and she should be appropriate for surgery today -SCDs overnight, start Lovenox post-operatively (or as per ortho) -Pain control with Robxain, Vicodin, and Morphine prn -SW consult for rehab placement - she is clearly not appropriate to live at home independently; daughter prefers [facility] -Will need PT consult post-operatively -Hip fracture order set utilized  HTN -Continue Cardizem and Lopressor  Dementia -As  noted above, significant safety concerns with her home living situation; consider APS consult -Continue Exelon  afib -Rate controlled with Cardizem and Lopressor, will continue -She is not taking AC and according to her daughter has never been on these medications  Hyperglycemia -May be stress response -Will follow with fasting AM labs -It is unlikely that she will need acute or chronic treatment for this issue    Note: This patient has been tested and is negative for the novel coronavirus COVID-19.   DVT prophylaxis:  SCDs until approved for Lovenox by orthopedics Code Status:  FULL - confirmed with family for now Family Communication: None present; I spoke with her daughter by telephone Disposition Plan:  Will need placement once clinically improved Consults called: Orthopedics; SW, Nutrition; will need PT post-operatively  Admission status: Admit - It is my clinical opinion that admission to INPATIENT is reasonable and necessary because of the expectation that this patient will require hospital care that crosses at least 2 midnights to treat this condition based on the medical complexity of the problems presented.  Given the aforementioned information, the predictability of an adverse outcome is felt to be significant.   Jonah Blue MD [facility]   How to contact the [facility] Attending  or Consulting provider 7A - 7P or covering provider during after hours 7P -7A, for this patient?  1. Check the care team in [facility] and look for a) attending/consulting [facility] provider listed and b) the [facility] team listed 2. Log into [email or web address] and use [place]'s universal password to access. If you do not have the password, please contact the hospital operator. 3. Locate the [facility] provider you are looking for under [facility] and page to a number that you can be directly reached. 4. If you still have difficulty reaching the provider, please page the [facility] (Director on Call) for the Hospitalists listed on amion for assistance.   12/14/2018, 12:24 PM

## 2018-12-14 NOTE — ED Provider Notes (Signed)
[facility] EMERGENCY DEPARTMENT Provider Note   CSN: [id number] Arrival date & time: 12/14/18  0707    History   Chief Complaint Chief Complaint  Patient presents with   Fall   Hip Pain    HPI Amy Randall is a 85 y.o. female.     Patient with dementia, high blood pressure, atrial fibrillation unknown anticoagulation presents after unwitnessed fall this morning.  Patient per report by EMS has history of dementia and signs placed around the house did not leave the home.  Patient lives alone.  Unknown last normal.  Patient found outside by community members walking by on the road and she had fallen.  Unwitnessed fall.  Patient had deformity to the right hip and shortening.  Patient does not recall details.  Unable to obtain significant details from patient.  No family at bedside.     Past Medical History:  Diagnosis Date   Atrial fibrillation (HCC)    Dementia (HCC)    Hypertension     Patient Active Problem List   Diagnosis Date Noted   Hip fracture (HCC) 12/14/2018   Essential hypertension 12/14/2018   Dementia without behavioral disturbance (HCC) 12/14/2018   AF (paroxysmal atrial fibrillation) (HCC) 12/14/2018   Hyperglycemia 12/14/2018    History reviewed. No pertinent surgical history.   OB History   No obstetric history on file.      Home Medications    Prior to Admission medications   Medication Sig Start Date End Date Taking? Authorizing Provider  acetaminophen (TYLENOL) 500 MG tablet Take 1,000 mg by mouth every 6 (six) hours as needed for mild pain (patient takes every extended release everyday at 2 p).    Yes [provider]  Cholecalciferol (VITAMIN D3) 50 MCG (2000 UT) TABS Take 2,000 Units by mouth daily.    Yes [provider]  diltiazem (CARDIZEM CD) 300 MG 24 hr capsule Take 1 capsule (300 mg total) by mouth daily. 10/07/13  Yes Runell Gess, MD  metoprolol tartrate (LOPRESSOR) 25 MG tablet  Take 25 mg by mouth 2 (two) times daily.   Yes [provider]  potassium chloride (K-DUR) 10 MEQ tablet Take 10 mEq by mouth daily.   Yes [provider]  rivastigmine (EXELON) 9.5 mg/24hr Place 9.5 mg onto the skin daily.   Yes [provider]  sertraline (ZOLOFT) 25 MG tablet Take 12.5 mg by mouth daily.   Yes [provider]    Family History Family History  Problem Relation Age of Onset   Hypertension Father    Stroke Father    Diabetes Maternal Grandmother     Social History Social History   Tobacco Use   Smoking status: Never Smoker   Smokeless tobacco: Never Used  Substance Use Topics   Alcohol use: Not on file   Drug use: Not on file     Allergies   Bactrim [sulfamethoxazole-trimethoprim]; Ibuprofen; Orange fruit [citrus]; and Penicillins   Review of Systems Review of Systems  Unable to perform ROS: Dementia     Physical Exam Updated Vital Signs BP 134/87 (BP Location: Right Arm)    Pulse 82    Temp 97.9 F (36.6 C) (Oral)    Resp 16    Ht  (1.549 m)    Wt 45.8 kg    SpO2 95%    BMI 19.08 kg/m   Physical Exam Vitals signs and nursing note reviewed.  Constitutional:      Appearance: She  is well-developed.  HENT:     Head: Normocephalic and atraumatic.  Eyes:     General:        Right eye: No discharge.        Left eye: No discharge.     Conjunctiva/sclera: Conjunctivae normal.     Pupils: Pupils are equal, round, and reactive to light.  Neck:     Musculoskeletal: Normal range of motion and neck supple.     Trachea: No tracheal deviation.  Cardiovascular:     Rate and Rhythm: Normal rate.  Pulmonary:     Effort: Pulmonary effort is normal.  Abdominal:     General: There is no distension.     Palpations: Abdomen is soft.     Tenderness: There is no abdominal tenderness. There is no guarding.  Musculoskeletal:        General: Tenderness, deformity and signs of injury present.     Comments: Patient  has shortened and internally rotated right hip/leg.  Discomfort with range of motion.  Patient has superficial abrasion/skin tear left lateral elbow without joint effusion or obvious tenderness.  Compartments soft in the right leg.  No midline cervical lumbar or thoracic tenderness.  Full range of motion head neck without discomfort.  Skin:    General: Skin is warm.     Findings: No rash.  Neurological:     Mental Status: She is alert.     GCS: GCS eye subscore is 4. GCS verbal subscore is 4. GCS motor subscore is 6.     Comments: Patient alert, answers majority of questions.  Patient has general confusion.  Clinically pleasant dementia.  Patient moves all extremities bilateral, decreased on the right lower due to pain and deformity.  No obvious arm drift.  Gross sensation intact.  No obvious facial droop.  Pupils equal bilateral.  Psychiatric:        Mood and Affect: Affect is flat.        Speech: Speech normal.        Cognition and Memory: Memory is impaired. She exhibits impaired recent memory.      ED Treatments / Results  Labs (all labs ordered are listed, but only abnormal results are displayed) Labs Reviewed  SURGICAL PCR SCREEN - Abnormal; Notable for the following components:      Result Value   Staphylococcus aureus POSITIVE (*)    All other components within normal limits  BASIC METABOLIC PANEL - Abnormal; Notable for the following components:   Potassium 5.6 (*)    Glucose, Bld 197 (*)    Creatinine, Ser 1.05 (*)    Calcium 8.7 (*)    GFR calc non Af Amer 49 (*)    GFR calc Af Amer 57 (*)    All other components within normal limits  CBC WITH DIFFERENTIAL/PLATELET - Abnormal; Notable for the following components:   WBC 11.1 (*)    Hemoglobin 11.9 (*)    Neutro Abs 9.2 (*)    All other components within normal limits  BASIC METABOLIC PANEL - Abnormal; Notable for the following components:   Glucose, Bld 113 (*)    Calcium 8.5 (*)    All other components within  normal limits  SARS CORONAVIRUS 2 (HOSPITAL ORDER, PERFORMED IN [facility] LAB)  URINALYSIS, ROUTINE W REFLEX MICROSCOPIC  PROTIME-INR  TYPE AND SCREEN  ABO/RH    EKG EKG Interpretation  Date/Time:  Saturday Dec 14 2018 07:40:10 EDT Ventricular Rate:  76 PR Interval:  QRS Duration: 81 QT Interval:  419 QTC Calculation: 443 R Axis:   74 Text Interpretation:  Atrial fibrillation Short PR interval Probable anteroseptal infarct Artifact Confirmed by Blane Ohara [phone]) on 12/14/2018 7:58:38 AM   Radiology Dg Elbow 2 Views Left  Result Date: 12/14/2018 CLINICAL DATA:  Fall. Elbow laceration. EXAM: LEFT ELBOW - 2 VIEW COMPARISON:  None. FINDINGS: An IV catheter is noted in the antecubital region. Elevation of the anterior fat pad is compatible with a small elbow joint effusion. No acute fracture or dislocation is identified. IMPRESSION: Small elbow joint effusion without an acute fracture evident. Electronically Signed   By: Sebastian Ache M.D.   On: 12/14/2018 08:29   Ct Head Wo Contrast  Result Date: 12/14/2018 CLINICAL DATA:  Found on ground this morning. EXAM: CT HEAD WITHOUT CONTRAST CT CERVICAL SPINE WITHOUT CONTRAST TECHNIQUE: Multidetector CT imaging of the head and cervical spine was performed following the standard protocol without intravenous contrast. Multiplanar CT image reconstructions of the cervical spine were also generated. COMPARISON:  None. FINDINGS: CT HEAD FINDINGS Brain: There is no evidence of acute infarct, intracranial hemorrhage, mass, midline shift, or extra-axial fluid collection. Small chronic infarcts are present in the bilateral basal ganglia, left cerebellum, and potentially thalami. Confluent cerebral white matter hypodensities are nonspecific but compatible with moderate to severe chronic small vessel ischemic disease. There is moderately advanced cerebral atrophy. Vascular: Calcified atherosclerosis at the skull base. No hyperdense vessel. Skull: No  fracture or focal osseous lesion. Sinuses/Orbits: Visualized paranasal sinuses and mastoid air cells are clear. Visualized orbits are unremarkable. Other: None. CT CERVICAL SPINE FINDINGS Alignment: Mild reversal of the normal cervical lordosis. Minimal anterolisthesis of C2 on C3, C4 on C5, and C7 on T1. Skull base and vertebrae: No acute fracture or suspicious osseous lesion. Mild C1-2 arthropathy. Soft tissues and spinal canal: No prevertebral fluid or swelling. No visible canal hematoma. Disc levels: Disc degeneration most advanced at C3-4 where there is degenerative interbody ankylosis. Moderately advanced disc degeneration at C6-7. Moderate cervical facet arthrosis. Moderate left neural foraminal stenosis at C5-6 and C6-7. Upper chest: Clear lung apices. Other: Moderate enlargement of the left greater than right thyroid lobes with multiple underlying nodules and with left-sided thyroid extension into the superior mediastinum, incompletely imaged. Associated rightward tracheal deviation without significant airway narrowing. Mild calcified atherosclerosis at the carotid bifurcations. IMPRESSION: 1. No evidence of acute intracranial abnormality. 2. Moderate to severe chronic small vessel ischemic disease. 3. No evidence of acute cervical spine fracture. 4. Multinodular thyroid goiter. Electronically Signed   By: Sebastian Ache M.D.   On: 12/14/2018 08:19   Ct Cervical Spine Wo Contrast  Result Date: 12/14/2018 CLINICAL DATA:  Found on ground this morning. EXAM: CT HEAD WITHOUT CONTRAST CT CERVICAL SPINE WITHOUT CONTRAST TECHNIQUE: Multidetector CT imaging of the head and cervical spine was performed following the standard protocol without intravenous contrast. Multiplanar CT image reconstructions of the cervical spine were also generated. COMPARISON:  None. FINDINGS: CT HEAD FINDINGS Brain: There is no evidence of acute infarct, intracranial hemorrhage, mass, midline shift, or extra-axial fluid collection. Small  chronic infarcts are present in the bilateral basal ganglia, left cerebellum, and potentially thalami. Confluent cerebral white matter hypodensities are nonspecific but compatible with moderate to severe chronic small vessel ischemic disease. There is moderately advanced cerebral atrophy. Vascular: Calcified atherosclerosis at the skull base. No hyperdense vessel. Skull: No fracture or focal osseous lesion. Sinuses/Orbits: Visualized paranasal sinuses and mastoid air cells are clear. Visualized orbits  are unremarkable. Other: None. CT CERVICAL SPINE FINDINGS Alignment: Mild reversal of the normal cervical lordosis. Minimal anterolisthesis of C2 on C3, C4 on C5, and C7 on T1. Skull base and vertebrae: No acute fracture or suspicious osseous lesion. Mild C1-2 arthropathy. Soft tissues and spinal canal: No prevertebral fluid or swelling. No visible canal hematoma. Disc levels: Disc degeneration most advanced at C3-4 where there is degenerative interbody ankylosis. Moderately advanced disc degeneration at C6-7. Moderate cervical facet arthrosis. Moderate left neural foraminal stenosis at C5-6 and C6-7. Upper chest: Clear lung apices. Other: Moderate enlargement of the left greater than right thyroid lobes with multiple underlying nodules and with left-sided thyroid extension into the superior mediastinum, incompletely imaged. Associated rightward tracheal deviation without significant airway narrowing. Mild calcified atherosclerosis at the carotid bifurcations. IMPRESSION: 1. No evidence of acute intracranial abnormality. 2. Moderate to severe chronic small vessel ischemic disease. 3. No evidence of acute cervical spine fracture. 4. Multinodular thyroid goiter. Electronically Signed   By: Sebastian AcheAllen  Grady M.D.   On: 12/14/2018 08:19   Dg Pelvis Portable  Result Date: 12/14/2018 CLINICAL DATA:  Unwitnessed fall.  Right hip deformity. EXAM: PORTABLE PELVIS 1-2 VIEWS COMPARISON:  None. FINDINGS: Severely angulated and  displaced fracture is seen involving the intertrochanteric region of the proximal right femur. Left hip appears normal. IMPRESSION: Severely angulated and displaced intertrochanteric fracture of proximal right femur. Electronically Signed   By: Lupita RaiderJames  Green Jr M.D.   On: 12/14/2018 08:15   Dg Chest Portable 1 View  Result Date: 12/14/2018 CLINICAL DATA:  Unwitnessed fall. EXAM: PORTABLE CHEST 1 VIEW COMPARISON:  None. FINDINGS: Mild cardiomegaly is noted. No pneumothorax or pleural effusion is noted. Mild bibasilar subsegmental atelectasis or infiltrates are noted. The visualized skeletal structures are unremarkable. IMPRESSION: Mild bibasilar subsegmental atelectasis or infiltrates. Electronically Signed   By: Lupita RaiderJames  Green Jr M.D.   On: 12/14/2018 08:14   Dg C-arm 1-60 Min  Result Date: 12/14/2018 CLINICAL DATA:  Right femur intertrochanteric fracture, operative fixation EXAM: OPERATIVE RIGHT HIP (WITH PELVIS IF PERFORMED) 4 VIEWS TECHNIQUE: Fluoroscopic spot image(s) were submitted for interpretation post-operatively. COMPARISON:  12/14/2018 FINDINGS: Spot fluoroscopic intraoperative views demonstrate screw and rod fixation of the right hip intertrochanteric fracture. Fracture lines remain visible. Improved alignment. No complicating feature or hardware abnormality. Peripheral atherosclerosis noted. Bones are osteopenic. IMPRESSION: Status post ORIF of the right hip intertrochanteric fracture with improved alignment. Electronically Signed   By: Judie PetitM.  Shick M.D.   On: 12/14/2018 14:39   Dg Hip Operative Unilat With Pelvis Right  Result Date: 12/14/2018 CLINICAL DATA:  Right femur intertrochanteric fracture, operative fixation EXAM: OPERATIVE RIGHT HIP (WITH PELVIS IF PERFORMED) 4 VIEWS TECHNIQUE: Fluoroscopic spot image(s) were submitted for interpretation post-operatively. COMPARISON:  12/14/2018 FINDINGS: Spot fluoroscopic intraoperative views demonstrate screw and rod fixation of the right hip  intertrochanteric fracture. Fracture lines remain visible. Improved alignment. No complicating feature or hardware abnormality. Peripheral atherosclerosis noted. Bones are osteopenic. IMPRESSION: Status post ORIF of the right hip intertrochanteric fracture with improved alignment. Electronically Signed   By: Judie PetitM.  Shick M.D.   On: 12/14/2018 14:39   Dg Femur Min 2 Views Right  Result Date: 12/14/2018 CLINICAL DATA:  Femur fracture. EXAM: RIGHT FEMUR 2 VIEWS COMPARISON:  None. FINDINGS: Severely displaced and comminuted fracture is seen involving the intertrochanteric region of the proximal right femur. Vascular calcifications are noted. Severe degenerative joint disease of the right knee is noted. IMPRESSION: Comminuted and severely displaced intertrochanteric fracture of proximal right femur  is noted. Electronically Signed   By: Lupita Raider M.D.   On: 12/14/2018 08:21    Procedures Procedures (including critical care time)  Medications Ordered in ED Medications  diltiazem (CARDIZEM CD) 24 hr capsule 300 mg (300 mg Oral Given 12/14/18 1511)  metoprolol tartrate (LOPRESSOR) tablet 25 mg ( Oral MAR Unhold 12/14/18 1443)  rivastigmine (EXELON) 9.5 mg/24hr 9.5 mg (9.5 mg Transdermal Patch Applied 12/14/18 1512)  sertraline (ZOLOFT) tablet 12.5 mg (12.5 mg Oral Given 12/14/18 1510)  methocarbamol (ROBAXIN) tablet 500 mg ( Oral MAR Unhold 12/14/18 1443)    Or  methocarbamol (ROBAXIN) 500 mg in dextrose 5 % 50 mL IVPB ( Intravenous MAR Unhold 12/14/18 1443)  docusate sodium (COLACE) capsule 100 mg ( Oral MAR Unhold 12/14/18 1443)  polyethylene glycol (MIRALAX / GLYCOLAX) packet 17 g ( Oral MAR Unhold 12/14/18 1443)  bisacodyl (DULCOLAX) EC tablet 5 mg ( Oral MAR Unhold 12/14/18 1443)  multivitamin liquid 15 mL (has no administration in time range)  feeding supplement (BOOST / RESOURCE BREEZE) liquid 1 Container (has no administration in time range)  feeding supplement (ENSURE ENLIVE) (ENSURE ENLIVE) liquid 237 mL  (has no administration in time range)  ondansetron (ZOFRAN) tablet 4 mg ( Oral See Alternative 12/14/18 1513)    Or  ondansetron (ZOFRAN) injection 4 mg (4 mg Intravenous Given 12/14/18 1513)  metoCLOPramide (REGLAN) tablet 5-10 mg (has no administration in time range)    Or  metoCLOPramide (REGLAN) injection 5-10 mg (has no administration in time range)  ceFAZolin (ANCEF) IVPB 1 g/50 mL premix (has no administration in time range)  acetaminophen (TYLENOL) tablet 325-650 mg (has no administration in time range)  HYDROcodone-acetaminophen (NORCO/VICODIN) 5-325 MG per tablet 1-2 tablet (has no administration in time range)  HYDROcodone-acetaminophen (NORCO) 7.5-325 MG per tablet 1-2 tablet (has no administration in time range)  morphine 2 MG/ML injection 0.5-1 mg (has no administration in time range)  acetaminophen (TYLENOL) tablet 500 mg (has no administration in time range)  lactated ringers infusion ( Intravenous Rate/Dose Change 12/14/18 1554)  sodium chloride 0.9 % bolus 500 mL (0 mLs Intravenous Stopped 12/14/18 0956)  ceFAZolin (ANCEF) IVPB 2g/100 mL premix (2 g Intravenous Given 12/14/18 1305)     Initial Impression / Assessment and Plan / ED Course  I have reviewed the triage vital signs and the nursing notes.  Pertinent labs & imaging results that were available during my care of the patient were reviewed by me and considered in my medical decision making (see chart for details).       Patient presents for assessment after unwitnessed fall and being found on the ground.  Unknown details of the event.  Plan for blood work, x-ray, CT head neck.  Clinical concern for hip fracture.  Urinalysis pending.  COVID test ordered as patient plan to be admitted. Blood work reviewed creatinine 1.05, potassium 5.7.  Discussed with orthopedics for consult for hip fracture, intertrochanteric reviewed on x-ray. Patient has mild elevated white blood cell count 11.1.  Amy Randall was evaluated in  Emergency Department on 12/14/2018 for the symptoms described in the history of present illness. She was evaluated in the context of the global COVID-19 pandemic, which necessitated consideration that the patient might be at risk for infection with the SARS-CoV-2 virus that causes COVID-19. Institutional protocols and algorithms that pertain to the evaluation of patients at risk for COVID-19 are in a state of rapid change based on information released by regulatory bodies including the CDC  and federal and state organizations. These policies and algorithms were followed during the patient's care in the ED.  CHA2DS2/VAS Stroke Risk Points  Current as of 4 minutes ago     3 >= 2 Points: High Risk  1 - 1.99 Points: Medium Risk  0 Points: Low Risk    The patient's score has not changed in the past year.:  No Change     Details    This score determines the patient's risk of having a stroke if the  patient has atrial fibrillation.       Points Metrics  0 Has Congestive Heart Failure:  No    Current as of 4 minutes ago  0 Has Vascular Disease:  No    Current as of 4 minutes ago  0 Has Hypertension:  No    Current as of 4 minutes ago  2 Age:  7    Current as of 4 minutes ago  0 Has Diabetes:  No    Current as of 4 minutes ago  0 Had Stroke:  No  Had TIA:  No  Had thromboembolism:  No    Current as of 4 minutes ago  1 Female:  Yes    Current as of 4 minutes ago    Known atrial fib.  Discussing with family to update med list.   Admitted.       Final Clinical Impressions(s) / ED Diagnoses   Final diagnoses:  Fall  Closed right hip fracture, initial encounter [facility])  Confusion    ED Discharge Orders    None       Blane Ohara, MD 12/14/18 1600

## 2018-12-14 NOTE — Progress Notes (Signed)
Initial Nutrition Assessment  DOCUMENTATION CODES:  Not applicable  INTERVENTION:  Postoperatively, while on CL:  -Boost Breeze po TID, each supplement provides 250 kcal and 9 grams of protein  Once diet advance to [place]: -Ensure Enlive po BID, each supplement provides 350 kcal and 20 grams of protein  MVI with minerals  NUTRITION DIAGNOSIS:  Increased nutrient needs related to post-op healing as evidenced by estimated nutritional requirements for this condition  GOAL:  Patient will meet greater than or equal to 90% of their needs  MONITOR:  PO intake, Labs, I & O's, Supplement acceptance, Diet advancement  REASON FOR ASSESSMENT:  Consult Hip fracture protocol  ASSESSMENT:  85 y/o female PMHx Dementia, Afib, HTN, Anxiety. Presents after she was found outside on ground by [employer]. In ED, pt was found to have suffered a R hip fx. Admitted for surgical intervention.    RD operating remotely d/t COVID precautions.   Per chart, pt lived alone and was receiving frequent care by her family who lived close by. Pts dementia is apparently fairly advanced, with her only intermittently orientated to person at baseline.   There is no documentation regarding patients oral intake/nutrition. However, her wt history suggests gaps in her nutritional status. She is currently 101 lbs, but documentation suggests she had been 130-140 lbs less than a year ago. If accurate, this would equate to a loss of >20% bw in 1 year.   Noted in chart that family is now seeking placement for pt. Feel that a higher level of care will also help greatly with pts nutrition status. Pt is currently in OR for IM of R hip. Will order oral supplements and mvi post operatively. Given her apparent wt loss over past 1-2 years, would recommend continuation of these outpatient.   Labs: K: 5.6->3.5 Meds: Colace, IV abx   Recent Labs  Lab 12/14/18 0719 12/14/18 1047  NA 139 140  K 5.6* 3.5  CL 105 105  CO2 22 22  BUN 21  20  CREATININE 1.05* 0.85  CALCIUM 8.7* 8.5*  GLUCOSE 197* 113*   NUTRITION - FOCUSED PHYSICAL EXAM: Unable to conduct  Diet Order:   Diet Order            Diet NPO time specified  Diet effective now             EDUCATION NEEDS:  No education needs have been identified at this time  Skin: Surgical incision to R hip  Last BM:  Unknown  Height:  Ht Readings from Last 1 Encounters:  12/14/18 5\' 1"  (1.549 m)   Weight:  Wt Readings from Last 1 Encounters:  12/14/18 45.8 kg   Wt Readings from Last 10 Encounters:  12/14/18 45.8 kg  01/26/18 59 kg  03/21/17 61.2 kg  02/15/17 62.6 kg   Ideal Body Weight:  47.73 kg  BMI:  Body mass index is 19.08 kg/m.  Estimated Nutritional Needs:  Kcal:  1450-1600 (32-35 kcal/kg bw) Protein:  65-75g Pro (1.4-1.6g/kg bw) Fluid:  >1.2 L (25 ml/kg bw)  Christophe Louis RD, LDN, CNSC Clinical Nutrition Available Tues-Sat via Pager: [phone] 12/14/2018 1:44 PM

## 2018-12-15 DIAGNOSIS — R410 Disorientation, unspecified: Secondary | ICD-10-CM

## 2018-12-15 LAB — CBC
HCT: 26.6 % — ABNORMAL LOW (ref 36.0–46.0)
Hemoglobin: 8.6 g/dL — ABNORMAL LOW (ref 12.0–15.0)
MCH: 29.3 pg (ref 26.0–34.0)
MCHC: 32.3 g/dL (ref 30.0–36.0)
MCV: 90.5 fL (ref 80.0–100.0)
Platelets: 112 10*3/uL — ABNORMAL LOW (ref 150–400)
RBC: 2.94 MIL/uL — ABNORMAL LOW (ref 3.87–5.11)
RDW: 14.5 % (ref 11.5–15.5)
WBC: 8.4 10*3/uL (ref 4.0–10.5)
nRBC: 0 % (ref 0.0–0.2)

## 2018-12-15 LAB — BASIC METABOLIC PANEL
Anion gap: 11 (ref 5–15)
BUN: 22 mg/dL (ref 8–23)
CO2: 24 mmol/L (ref 22–32)
Calcium: 8.2 mg/dL — ABNORMAL LOW (ref 8.9–10.3)
Chloride: 102 mmol/L (ref 98–111)
Creatinine, Ser: 1.14 mg/dL — ABNORMAL HIGH (ref 0.44–1.00)
GFR calc Af Amer: 51 mL/min — ABNORMAL LOW (ref >60)
GFR calc non Af Amer: 44 mL/min — ABNORMAL LOW (ref >60)
Glucose, Bld: 121 mg/dL — ABNORMAL HIGH (ref 70–99)
Potassium: 3.7 mmol/L (ref 3.5–5.1)
Sodium: 137 mmol/L (ref 135–145)

## 2018-12-15 MED ORDER — SODIUM CHLORIDE 0.9 % IV BOLUS
250.0000 mL | Freq: Once | INTRAVENOUS | Status: AC
  Administered 2018-12-15: 11:00:00 250 mL via INTRAVENOUS

## 2018-12-15 MED ORDER — ASPIRIN EC 325 MG PO TBEC
325.0000 mg | DELAYED_RELEASE_TABLET | Freq: Every day | ORAL | Status: DC
  Administered 2018-12-15 – 2018-12-17 (×3): 325 mg via ORAL
  Filled 2018-12-15 (×3): qty 1

## 2018-12-15 MED ORDER — SODIUM CHLORIDE 0.9 % IV BOLUS: 250.0000 mL | Freq: Once | INTRAVENOUS | Status: DC

## 2018-12-15 MED ORDER — SODIUM CHLORIDE 0.9 % IV SOLN
INTRAVENOUS | Status: DC
  Administered 2018-12-15 – 2018-12-17 (×6): via INTRAVENOUS

## 2018-12-15 NOTE — Evaluation (Signed)
Occupational Therapy Evaluation Patient Details Name: Amy DingwallVirginia T Randall MRN: [id number] DOB: 07/29/1934 Today's Date: 12/15/2018    History of Present Illness Patient is a 85 y/o female who presents with left hip fx s/p fall, found down in the street by neighbors now s/p IM nail right hip. PMH includes HTN. A-fib. dementia.   Clinical Impression   Per chart review, pt was living at home with a sitter during the day PTA. Pt received assistance for ADLs and would forget to use DME for mobility and furniture walk within house. Pt currently requiring Mod-Max A for bathing, dressing, and toileting and Mod A for functional transfers. Pt presenting with decreased cognition and reporting her name is [name] and perseverating on "yes" during session. Pt presenting with decreased strength, balance, and activity tolerance as well. Pt would benefit from further acute OT to facilitate safe dc. Recommend dc to SNF for further OT to optimize safety, independence with ADLs, and return to PLOF.      Follow Up Recommendations  SNF;Supervision/Assistance - 24 hour    Equipment Recommendations  Other (comment)(Defer to next venue)    Recommendations for Other Services PT consult     Precautions / Restrictions Precautions Precautions: Fall Precaution Comments: hx of dementia Restrictions Weight Bearing Restrictions: Yes RLE Weight Bearing: Weight bearing as tolerated      Mobility Bed Mobility Overal bed mobility: Needs Assistance Bed Mobility: Supine to Sit     Supine to sit: Mod assist;+2 for physical assistance;HOB elevated     General bed mobility comments: Requires assist with LEs, trunk and to scoot bottom to EOB. Posterior bias.   Transfers Overall transfer level: Needs assistance Equipment used: Rolling Tvedt (2 wheeled);2 person hand held assist Transfers: Sit to/from Stand Sit to Stand: Mod assist;+2 safety/equipment;From elevated surface         General transfer comment:  ASsist to power to standing initially with RW for support, ditched RW once up as pt pushing it too far anterior with posterior lean through hips.    Balance Overall balance assessment: Needs assistance Sitting-balance support: Feet supported;No upper extremity supported Sitting balance-Leahy Scale: Poor Sitting balance - Comments: Pt with LOB posteriorly on a few occasions needng Min A-Min guard for balance.  Postural control: Posterior lean;Left lateral lean Standing balance support: During functional activity Standing balance-Leahy Scale: Poor Standing balance comment: Requires external support in standing.                           ADL either performed or assessed with clinical judgement   ADL Overall ADL's : Needs assistance/impaired Eating/Feeding: Minimal assistance;Sitting   Grooming: Minimal assistance;Sitting   Upper Body Bathing: Sitting;Moderate assistance   Lower Body Bathing: Maximal assistance;Sit to/from stand   Upper Body Dressing : Sitting;Moderate assistance   Lower Body Dressing: Maximal assistance;Sit to/from stand   Toilet Transfer: Moderate assistance;+2 for safety/equipment;Stand-pivot(simulated to recliner)           Functional mobility during ADLs: Moderate assistance;+2 for safety/equipment(stand pivot) General ADL Comments: Due to cognition, pt requiring Mod-Max A for ADLs and Mod A for transfers. Pt participating in washing her face while seated in recliner with back support. Pt with difficulty maintaining sitting at EOB due to pain     Vision         Perception     Praxis      Pertinent Vitals/Pain Pain Assessment: Faces Faces Pain Scale: Hurts even more Pain Location: hip with  movement Pain Descriptors / Indicators: Grimacing;Guarding;Operative site guarding Pain Intervention(s): Monitored during session;Limited activity within patient's tolerance;Repositioned     Hand Dominance (Reaching out with left hand)    Extremity/Trunk Assessment Upper Extremity Assessment Upper Extremity Assessment: Generalized weakness   Lower Extremity Assessment Lower Extremity Assessment: Defer to PT evaluation   Cervical / Trunk Assessment Cervical / Trunk Assessment: Kyphotic   Communication Communication Communication: Expressive difficulties;Receptive difficulties   Cognition Arousal/Alertness: Awake/alert Behavior During Therapy: Flat affect Overall Cognitive Status: History of cognitive impairments - at baseline Area of Impairment: Orientation;Following commands;Problem solving;Safety/judgement;Awareness;Memory                 Orientation Level: Disoriented to;Person;Place;Time;Situation   Memory: Decreased short-term memory Following Commands: Follows one step commands inconsistently;Follows one step commands with increased time(multi modal cues) Safety/Judgement: Decreased awareness of safety;Decreased awareness of deficits Awareness: Intellectual(pre intellectual) Problem Solving: Decreased initiation;Difficulty sequencing;Slow processing;Requires verbal cues;Requires tactile cues General Comments: Pt not oriented to even person today. Perseverates on "yes". Follows a few 1 step simple commands inconsistently needing repetition and tactile cues.    General Comments  Played pt music (from 65s) and she smiled and seemed to enjoyed it    Exercises General Exercises - Lower Extremity Long Arc Quad: AROM;Both;5 reps   Shoulder Instructions      Home Living Family/patient expects to be discharged to:: Skilled nursing facility Living Arrangements: Alone;Other (Comment)(private sitters) Available Help at Discharge: Personal care attendant;Available PRN/intermittently Type of Home: House                       Home Equipment: Muff - 2 wheels;Cane - single point   Additional Comments: Information from chart review and RN      Prior Functioning/Environment Level of Independence:  Needs assistance        Comments: Per chart, pt uses furniture for walking; has private sitters, not sure how long. per notes, pt has RW and SPC but forgets to use them.         OT Problem List: Decreased strength;Decreased range of motion;Decreased activity tolerance;Impaired balance (sitting and/or standing);Decreased cognition;Decreased safety awareness;Decreased knowledge of use of DME or AE;Decreased knowledge of precautions;Pain      OT Treatment/Interventions: Self-care/ADL training;Therapeutic exercise;Energy conservation;DME and/or AE instruction;Therapeutic activities;Patient/family education    OT Goals(Current goals can be found in the care plan section) Acute Rehab OT Goals Patient Stated Goal: none stated OT Goal Formulation: Patient unable to participate in goal setting Time For Goal Achievement: 12/29/18 Potential to Achieve Goals: Good  OT Frequency: Min 2X/week   Barriers to D/C:            Co-evaluation PT/OT/SLP Co-Evaluation/Treatment: Yes Reason for Co-Treatment: For patient/therapist safety;To address functional/ADL transfers PT goals addressed during session: Mobility/safety with mobility;Balance OT goals addressed during session: ADL's and self-care      AM-PAC OT "6 Clicks" Daily Activity     Outcome Measure Help from another person eating meals?: A Little Help from another person taking care of personal grooming?: A Little Help from another person toileting, which includes using toliet, bedpan, or urinal?: A Lot Help from another person bathing (including washing, rinsing, drying)?: A Lot Help from another person to put on and taking off regular upper body clothing?: A Lot Help from another person to put on and taking off regular lower body clothing?: A Lot 6 Click Score: 14   End of Session Equipment Utilized During Treatment: Gait belt;Rolling Merrihew Nurse Communication: Mobility status  Activity Tolerance: Patient tolerated treatment  well Patient left: in chair;with call bell/phone within reach;with chair alarm set  OT Visit Diagnosis: Unsteadiness on feet (R26.81);Other abnormalities of gait and mobility (R26.89);Muscle weakness (generalized) (M62.81);Pain Pain - Right/Left: Right Pain - part of body: Hip                Time: 7681-1572 OT Time Calculation (min): 18 min Charges:  OT General Charges $OT Visit: 1 Visit OT Evaluation $OT Eval Moderate Complexity: 1 Mod  Jacquese Hackman MSOT, OTR/L Acute Rehab Pager: [phone] Office: [phone]  Theodoro Grist Danton Palmateer 12/15/2018, 8:41 AM

## 2018-12-15 NOTE — Progress Notes (Signed)
PROGRESS NOTE    Amy Randall  ZOX:[id number] DOB: March 19, 1934 DOA: 12/14/2018 PCP: [name], MD    Brief Narrative:  85 y.o. female with medical history significant of HTN; afib; and dementia presenting with a fall.  Patient has dementia and is not oriented to even person.  I spoke to her daughter, who provided the remaining history.  Her baseline is that she knows her name.  If she is upset, she will say that she does not know her name.  She has anxiety and panic attacks.  Her daughter estimates that she was outside for 1-2 hours in the cold - she might be stressed, hungry.  The family has cameras in the house.  Around 430, she went out the door.  She usually has to hold on to handrails and furniture to ambulate.  Her daughter thinks she fell and crawled.  She was found by neighbors several hours later.  Family is uncertain about where she will need to be placed - her family does not want her in [place].  Her daughter currently prefers [facility].  The patient has never voiced an opinion about resuscitation.  The daughter will get back to [place] on code status - she would prefer not to resuscitate but she will be in touch with her brother to make this decision.   ED Course:  Found in the street by neighbors, unknown length of time down.  Ortho consult pending for R hip fracture.    Assessment & Plan:   Principal Problem:   Hip fracture (HCC) Active Problems:   Essential hypertension   Dementia without behavioral disturbance (HCC)   AF (paroxysmal atrial fibrillation) (HCC)   Hyperglycemia    Hip fracture -Mechanical fall resulting in hip fracture -Orthopedics consulted -Pt underwent surgery 5/9 -SW consult for rehab placement, daughter reportedly prefers [facility] -Continue with PT as tolerated -Hip fracture order set utilized  HTN -Continue Cardizem and Lopressor -BP low this AM, requiring NS bolus -Continue IVF hydration  Dementia -As noted above,  significant safety concerns with her home living situation; consider APS consult -Continue Exelon as tolerated  Afib, chronic -Cardizem and Lopressor, will continue -She is not taking AC -Remains rate controlled  Hyperglycemia -May be stress response -Random glucose stable  DVT prophylaxis: SCD's Code Status: Full Family Communication: Pt in room, family not at bedside Disposition Plan: SNF, timing uncertain  Consultants:   Orthopedic Surgery  Procedures:   R hip fracture surgery 5/9  Antimicrobials: Anti-infectives (From admission, onward)   Start     Dose/Rate Route Frequency Ordered Stop   12/14/18 1930  ceFAZolin (ANCEF) IVPB 1 g/50 mL premix     1 g 100 mL/hr over 30 Minutes Intravenous Every 6 hours 12/14/18 1447 12/15/18 0701   12/14/18 1015  ceFAZolin (ANCEF) IVPB 2g/100 mL premix     2 g 200 mL/hr over 30 Minutes Intravenous On call to O.R. 12/14/18 1010 12/14/18 1305       Subjective: Pleasantly confused  Objective: Vitals:   12/15/18 0435 12/15/18 1026 12/15/18 1058 12/15/18 1356  BP: 107/64 (!) 78/56 (!) 87/49 (!) 91/58  Pulse: 72 68 69 (!) 57  Resp: Temp: 98.1 F (36.7 C) 97.6 F (36.4 C)  98.5 F (36.9 C)  TempSrc: Oral Axillary  Axillary  SpO2: 98% 100%  100%  Weight:      Height:        Intake/Output Summary (Last 24 hours) at 12/15/2018 1541 Last  data filed at 12/15/2018 1536 Gross per 24 hour  Intake 1714.72 ml  Output 75 ml  Net 1639.72 ml   Filed Weights   12/14/18 1009  Weight: 45.8 kg    Examination:  General exam: Appears calm and comfortable  Respiratory system: Clear to auscultation. Respiratory effort normal. Cardiovascular system: S1 & S2 heard, RRR Gastrointestinal system: Abdomen is nondistended, soft and nontender. No organomegaly or masses felt. Normal bowel sounds heard. Central nervous system: Alert and oriented. No focal neurological deficits. Extremities: Symmetric 5 x 5 power. Skin: No rashes,  lesions Psychiatry: demented and confused  Data Reviewed: I have personally reviewed following labs and imaging studies  CBC: Recent Labs  Lab 12/14/18 0719 12/15/18 0232  WBC 11.1* 8.4  NEUTROABS 9.2*  --   HGB 11.9* 8.6*  HCT 37.0 26.6*  MCV 91.4 90.5  PLT 167 112*   Basic Metabolic Panel: Recent Labs  Lab 12/14/18 0719 12/14/18 1047 12/15/18 0232  NA 139 140 137  K 5.6* 3.5 3.7  CL 105 105 102  CO2 22 22 24   GLUCOSE 197* 113* 121*  BUN 21 20 22   CREATININE 1.05* 0.85 1.14*  CALCIUM 8.7* 8.5* 8.2*   GFR: Estimated Creatinine Clearance: 27 mL/min (A) (by C-G formula based on SCr of 1.14 mg/dL (H)). Liver Function Tests: No results for input(s): AST, ALT, ALKPHOS, BILITOT, PROT, ALBUMIN in the last 168 hours. No results for input(s): LIPASE, AMYLASE in the last 168 hours. No results for input(s): AMMONIA in the last 168 hours. Coagulation Profile: Recent Labs  Lab 12/14/18 0858  INR 1.1   Cardiac Enzymes: No results for input(s): CKTOTAL, CKMB, CKMBINDEX, TROPONINI in the last 168 hours. BNP (last 3 results) No results for input(s): PROBNP in the last 8760 hours. HbA1C: No results for input(s): HGBA1C in the last 72 hours. CBG: No results for input(s): GLUCAP in the last 168 hours. Lipid Profile: No results for input(s): CHOL, HDL, LDLCALC, TRIG, CHOLHDL, LDLDIRECT in the last 72 hours. Thyroid Function Tests: No results for input(s): TSH, T4TOTAL, FREET4, T3FREE, THYROIDAB in the last 72 hours. Anemia Panel: No results for input(s): VITAMINB12, FOLATE, FERRITIN, TIBC, IRON, RETICCTPCT in the last 72 hours. Sepsis Labs: No results for input(s): PROCALCITON, LATICACIDVEN in the last 168 hours.  Recent Results (from the past 240 hour(s))  SARS Coronavirus 2 (CEPHEID - Performed in [facility] hospital lab), Hosp Order     Status: None   Collection Time: 12/14/18  7:32 AM  Result Value Ref Range Status   SARS Coronavirus 2 NEGATIVE NEGATIVE Final     Comment: (NOTE) If result is NEGATIVE SARS-CoV-2 target nucleic acids are NOT DETECTED. The SARS-CoV-2 RNA is generally detectable in upper and lower  respiratory specimens during the acute phase of infection. The lowest  concentration of SARS-CoV-2 viral copies this assay can detect is 250  copies / mL. A negative result does not preclude SARS-CoV-2 infection  and should not be used as the sole basis for treatment or other  patient management decisions.  A negative result may occur with  improper specimen collection / handling, submission of specimen other  than nasopharyngeal swab, presence of viral mutation(s) within the  areas targeted by this assay, and inadequate number of viral copies  (<250 copies / mL). A negative result must be combined with clinical  observations, patient history, and epidemiological information. If result is POSITIVE SARS-CoV-2 target nucleic acids are DETECTED. The SARS-CoV-2 RNA is generally detectable in upper and lower  respiratory specimens dur ing the acute phase of infection.  Positive  results are indicative of active infection with SARS-CoV-2.  Clinical  correlation with patient history and other diagnostic information is  necessary to determine patient infection status.  Positive results do  not rule out bacterial infection or co-infection with other viruses. If result is PRESUMPTIVE POSTIVE SARS-CoV-2 nucleic acids MAY BE PRESENT.   A presumptive positive result was obtained on the submitted specimen  and confirmed on repeat testing.  While 2019 novel coronavirus  (SARS-CoV-2) nucleic acids may be present in the submitted sample  additional confirmatory testing may be necessary for epidemiological  and / or clinical management purposes  to differentiate between  SARS-CoV-2 and other Sarbecovirus currently known to infect humans.  If clinically indicated additional testing with an alternate test  methodology [phone]) is advised. The SARS-CoV-2  RNA is generally  detectable in upper and lower respiratory sp ecimens during the acute  phase of infection. The expected result is Negative. Fact Sheet for Patients:  BoilerBrush.com.cy Fact Sheet for Healthcare Providers: https://pope.com/ This test is not yet approved or cleared by the [place] FDA and has been authorized for detection and/or diagnosis of SARS-CoV-2 by FDA under an Emergency Use Authorization (EUA).  This EUA will remain in effect (meaning this test can be used) for the duration of the COVID-19 declaration under Section 564(b)(1) of the Act, 21 U.S.C. section 360bbb-3(b)(1), unless the authorization is terminated or revoked sooner. Performed at [facility], [address] [place], [place] [address]   Surgical pcr screen     Status: Abnormal   Collection Time: 12/14/18 10:31 AM  Result Value Ref Range Status   MRSA, PCR NEGATIVE NEGATIVE Final   Staphylococcus aureus POSITIVE (A) NEGATIVE Final    Comment: (NOTE) The Xpert SA Assay (FDA approved for NASAL specimens in patients 31 years of age and older), is one component of a comprehensive surveillance program. It is not intended to diagnose infection nor to guide or monitor treatment. Performed at [facility], [address], [place], [place] [address]      Radiology Studies: Dg Elbow 2 Views Left  Result Date: 12/14/2018 CLINICAL DATA:  Fall. Elbow laceration. EXAM: LEFT ELBOW - 2 VIEW COMPARISON:  None. FINDINGS: An IV catheter is noted in the antecubital region. Elevation of the anterior fat pad is compatible with a small elbow joint effusion. No acute fracture or dislocation is identified. IMPRESSION: Small elbow joint effusion without an acute fracture evident. Electronically Signed   By: Sebastian Ache M.D.   On: 12/14/2018 08:29   Ct Head Wo Contrast  Result Date: 12/14/2018 CLINICAL DATA:  Found on ground this morning. EXAM: CT HEAD  WITHOUT CONTRAST CT CERVICAL SPINE WITHOUT CONTRAST TECHNIQUE: Multidetector CT imaging of the head and cervical spine was performed following the standard protocol without intravenous contrast. Multiplanar CT image reconstructions of the cervical spine were also generated. COMPARISON:  None. FINDINGS: CT HEAD FINDINGS Brain: There is no evidence of acute infarct, intracranial hemorrhage, mass, midline shift, or extra-axial fluid collection. Small chronic infarcts are present in the bilateral basal ganglia, left cerebellum, and potentially thalami. Confluent cerebral white matter hypodensities are nonspecific but compatible with moderate to severe chronic small vessel ischemic disease. There is moderately advanced cerebral atrophy. Vascular: Calcified atherosclerosis at the skull base. No hyperdense vessel. Skull: No fracture or focal osseous lesion. Sinuses/Orbits: Visualized paranasal sinuses and mastoid air cells are clear. Visualized orbits are unremarkable. Other:  None. CT CERVICAL SPINE FINDINGS Alignment: Mild reversal of the normal cervical lordosis. Minimal anterolisthesis of C2 on C3, C4 on C5, and C7 on T1. Skull base and vertebrae: No acute fracture or suspicious osseous lesion. Mild C1-2 arthropathy. Soft tissues and spinal canal: No prevertebral fluid or swelling. No visible canal hematoma. Disc levels: Disc degeneration most advanced at C3-4 where there is degenerative interbody ankylosis. Moderately advanced disc degeneration at C6-7. Moderate cervical facet arthrosis. Moderate left neural foraminal stenosis at C5-6 and C6-7. Upper chest: Clear lung apices. Other: Moderate enlargement of the left greater than right thyroid lobes with multiple underlying nodules and with left-sided thyroid extension into the superior mediastinum, incompletely imaged. Associated rightward tracheal deviation without significant airway narrowing. Mild calcified atherosclerosis at the carotid bifurcations. IMPRESSION: 1.  No evidence of acute intracranial abnormality. 2. Moderate to severe chronic small vessel ischemic disease. 3. No evidence of acute cervical spine fracture. 4. Multinodular thyroid goiter. Electronically Signed   By: Sebastian AcheAllen  Grady M.D.   On: 12/14/2018 08:19   Ct Cervical Spine Wo Contrast  Result Date: 12/14/2018 CLINICAL DATA:  Found on ground this morning. EXAM: CT HEAD WITHOUT CONTRAST CT CERVICAL SPINE WITHOUT CONTRAST TECHNIQUE: Multidetector CT imaging of the head and cervical spine was performed following the standard protocol without intravenous contrast. Multiplanar CT image reconstructions of the cervical spine were also generated. COMPARISON:  None. FINDINGS: CT HEAD FINDINGS Brain: There is no evidence of acute infarct, intracranial hemorrhage, mass, midline shift, or extra-axial fluid collection. Small chronic infarcts are present in the bilateral basal ganglia, left cerebellum, and potentially thalami. Confluent cerebral white matter hypodensities are nonspecific but compatible with moderate to severe chronic small vessel ischemic disease. There is moderately advanced cerebral atrophy. Vascular: Calcified atherosclerosis at the skull base. No hyperdense vessel. Skull: No fracture or focal osseous lesion. Sinuses/Orbits: Visualized paranasal sinuses and mastoid air cells are clear. Visualized orbits are unremarkable. Other: None. CT CERVICAL SPINE FINDINGS Alignment: Mild reversal of the normal cervical lordosis. Minimal anterolisthesis of C2 on C3, C4 on C5, and C7 on T1. Skull base and vertebrae: No acute fracture or suspicious osseous lesion. Mild C1-2 arthropathy. Soft tissues and spinal canal: No prevertebral fluid or swelling. No visible canal hematoma. Disc levels: Disc degeneration most advanced at C3-4 where there is degenerative interbody ankylosis. Moderately advanced disc degeneration at C6-7. Moderate cervical facet arthrosis. Moderate left neural foraminal stenosis at C5-6 and C6-7.  Upper chest: Clear lung apices. Other: Moderate enlargement of the left greater than right thyroid lobes with multiple underlying nodules and with left-sided thyroid extension into the superior mediastinum, incompletely imaged. Associated rightward tracheal deviation without significant airway narrowing. Mild calcified atherosclerosis at the carotid bifurcations. IMPRESSION: 1. No evidence of acute intracranial abnormality. 2. Moderate to severe chronic small vessel ischemic disease. 3. No evidence of acute cervical spine fracture. 4. Multinodular thyroid goiter. Electronically Signed   By: Sebastian AcheAllen  Grady M.D.   On: 12/14/2018 08:19   Dg Pelvis Portable  Result Date: 12/14/2018 CLINICAL DATA:  Unwitnessed fall.  Right hip deformity. EXAM: PORTABLE PELVIS 1-2 VIEWS COMPARISON:  None. FINDINGS: Severely angulated and displaced fracture is seen involving the intertrochanteric region of the proximal right femur. Left hip appears normal. IMPRESSION: Severely angulated and displaced intertrochanteric fracture of proximal right femur. Electronically Signed   By: Lupita RaiderJames  Green Jr M.D.   On: 12/14/2018 08:15   Dg Chest Portable 1 View  Result Date: 12/14/2018 CLINICAL DATA:  Unwitnessed fall. EXAM: PORTABLE CHEST  1 VIEW COMPARISON:  None. FINDINGS: Mild cardiomegaly is noted. No pneumothorax or pleural effusion is noted. Mild bibasilar subsegmental atelectasis or infiltrates are noted. The visualized skeletal structures are unremarkable. IMPRESSION: Mild bibasilar subsegmental atelectasis or infiltrates. Electronically Signed   By: Lupita Raider M.D.   On: 12/14/2018 08:14   Dg C-arm 1-60 Min  Result Date: 12/14/2018 CLINICAL DATA:  Right femur intertrochanteric fracture, operative fixation EXAM: OPERATIVE RIGHT HIP (WITH PELVIS IF PERFORMED) 4 VIEWS TECHNIQUE: Fluoroscopic spot image(s) were submitted for interpretation post-operatively. COMPARISON:  12/14/2018 FINDINGS: Spot fluoroscopic intraoperative views  demonstrate screw and rod fixation of the right hip intertrochanteric fracture. Fracture lines remain visible. Improved alignment. No complicating feature or hardware abnormality. Peripheral atherosclerosis noted. Bones are osteopenic. IMPRESSION: Status post ORIF of the right hip intertrochanteric fracture with improved alignment. Electronically Signed   By: Judie Petit.  Shick M.D.   On: 12/14/2018 14:39   Dg Hip Operative Unilat With Pelvis Right  Result Date: 12/14/2018 CLINICAL DATA:  Right femur intertrochanteric fracture, operative fixation EXAM: OPERATIVE RIGHT HIP (WITH PELVIS IF PERFORMED) 4 VIEWS TECHNIQUE: Fluoroscopic spot image(s) were submitted for interpretation post-operatively. COMPARISON:  12/14/2018 FINDINGS: Spot fluoroscopic intraoperative views demonstrate screw and rod fixation of the right hip intertrochanteric fracture. Fracture lines remain visible. Improved alignment. No complicating feature or hardware abnormality. Peripheral atherosclerosis noted. Bones are osteopenic. IMPRESSION: Status post ORIF of the right hip intertrochanteric fracture with improved alignment. Electronically Signed   By: Judie Petit.  Shick M.D.   On: 12/14/2018 14:39   Dg Femur Min 2 Views Right  Result Date: 12/14/2018 CLINICAL DATA:  Femur fracture. EXAM: RIGHT FEMUR 2 VIEWS COMPARISON:  None. FINDINGS: Severely displaced and comminuted fracture is seen involving the intertrochanteric region of the proximal right femur. Vascular calcifications are noted. Severe degenerative joint disease of the right knee is noted. IMPRESSION: Comminuted and severely displaced intertrochanteric fracture of proximal right femur is noted. Electronically Signed   By: Lupita Raider M.D.   On: 12/14/2018 08:21    Scheduled Meds:  acetaminophen  500 mg Oral Q6H   aspirin EC  325 mg Oral Daily   diltiazem  300 mg Oral Daily   docusate sodium  100 mg Oral BID   feeding supplement (ENSURE ENLIVE)  237 mL Oral BID BM   metoprolol  tartrate  25 mg Oral BID   multivitamin  15 mL Oral Daily   rivastigmine  9.5 mg Transdermal Daily   sertraline  12.5 mg Oral Daily   Continuous Infusions:  sodium chloride 100 mL/hr at 12/15/18 1121   methocarbamol (ROBAXIN) IV       LOS: 1 day   Rickey Barbara, MD [facility] Pager On Amion  If 7PM-7AM, please contact night-coverage 12/15/2018, 3:41 PM

## 2018-12-15 NOTE — Plan of Care (Signed)

## 2018-12-15 NOTE — Progress Notes (Signed)
OT Cancellation Note  Patient Details Name: Amy Randall MRN: [id number] DOB: 03/05/1934   Cancelled Treatment:    Reason Eval/Treat Not Completed: Active bedrest order(Will return as schedule allows. Thank you.)  Karri Kallenbach M Terance Pomplun Ara [place] MSOT, OTR/L Acute Rehab Pager: [phone] Office: [phone] 12/15/2018, 7:26 AM

## 2018-12-15 NOTE — Progress Notes (Signed)
   Subjective:  Patient reports pain as mild.  Pleasantly demented.  No complaints.  Objective:   VITALS:   Vitals:   12/14/18 2146 12/14/18 2147 12/15/18 0146 12/15/18 0435  BP: 110/73  109/71 107/64  Pulse: (!) 38 78 88 72  Resp: 17  16 17   Temp: 98.2 F (36.8 C)  98.3 F (36.8 C) 98.1 F (36.7 C)  TempSrc: Oral  Oral Oral  SpO2: 98% 97% 95% 98%  Weight:      Height:        Neurovascular intact Sensation intact distally Intact pulses distally Dorsiflexion/Plantar flexion intact Incision: moderate drainage   Lab Results  Component Value Date   WBC 8.4 12/15/2018   HGB 8.6 (L) 12/15/2018   HCT 26.6 (L) 12/15/2018   MCV 90.5 12/15/2018   PLT 112 (L) 12/15/2018   BMET    Component Value Date/Time   NA 137 12/15/2018 0232   K 3.7 12/15/2018 0232   CL 102 12/15/2018 0232   CO2 24 12/15/2018 0232   GLUCOSE 121 (H) 12/15/2018 0232   BUN 22 12/15/2018 0232   CREATININE 1.14 (H) 12/15/2018 0232   CALCIUM 8.2 (L) 12/15/2018 0232   GFRNONAA 44 (L) 12/15/2018 0232   GFRAA 51 (L) 12/15/2018 0232     Assessment/Plan: 1 Day Post-Op   Principal Problem:   Hip fracture (HCC) Active Problems:   Essential hypertension   Dementia without behavioral disturbance (HCC)   AF (paroxysmal atrial fibrillation) (HCC)   Hyperglycemia   Up with therapy WBAT to RLE  - will need daily dry dressing changes to start today  - keep incisions dry x 1 week  - SCDs and daily 325mg  asa for DVT ppx.     Yolonda Kida 12/15/2018, 9:47 AM   Maryan Rued, MD [phone]

## 2018-12-15 NOTE — Discharge Instructions (Signed)
Ortho D/C instructions:  - ok for full weight bearing to the right leg - apply a clean and dry dressing to hip incisions once per day - ok to shower beginning 1 week from surgery, but do not submerge wound under water  - for prevention of blood clots take a 325 mg asa once daily for 6 weeks  - return to see Dr. Aundria Rud in 2 weeks   - for pain apply ice to the right hip for 20-30 minute per hour you are awake and take tylenol and ibuprofen for pain

## 2018-12-15 NOTE — Progress Notes (Signed)
PT Cancellation Note  Patient Details Name: Amy Randall MRN: [id number] DOB: [birth year]   Cancelled Treatment:    Reason Eval/Treat Not Completed: Active bedrest order Will await increase in activity orders prior to PT evaluation. Will follow.   Blake Divine A Kerman Pfost 12/15/2018, 7:26 AM Mylo Red, PT, DPT Acute Rehabilitation Services Pager [phone] Office [phone]

## 2018-12-15 NOTE — Plan of Care (Signed)

## 2018-12-15 NOTE — Evaluation (Signed)
Physical Therapy Evaluation Patient Details Name: Amy Randall MRN: [id number] DOB: 1934/06/01 Today's Date: 12/15/2018   History of Present Illness  Patient is a 85 y/o female who presents with left hip fx s/p fall, found down in the street by neighbors now s/p IM nail right hip. PMH includes HTN. A-fib. dementia.  Clinical Impression  Patient presents with pain and post surgical deficits s/p above surgery. Pt with hx of dementia and not even oriented to self. Pt not able to provide PLOF/home setup but per chart pt lives alone and has sitters during the day. Pt is a furniture [place] at baseline. Today, pt tolerated bed mobility and standing transfer with Mod A of 2 for safety. Tolerated taking a few steps to get to chair with max multimodal cues for balance/sequencing. Pt with poor motor planning, sequencing and ability to follow simple 1 step commands despite multimodal cues. Perseverating on "yes" during session. Pt is not safe to be home alone. High fall risk. Would benefit from SNF to maximize independence and mobility prior to return home. Will follow acutely.    Follow Up Recommendations SNF;Supervision for mobility/OOB;Supervision/Assistance - 24 hour    Equipment Recommendations  None recommended by PT    Recommendations for Other Services       Precautions / Restrictions Precautions Precautions: Fall Precaution Comments: hx of dementia Restrictions Weight Bearing Restrictions: Yes RLE Weight Bearing: Weight bearing as tolerated      Mobility  Bed Mobility Overal bed mobility: Needs Assistance Bed Mobility: Supine to Sit     Supine to sit: Mod assist;+2 for physical assistance;HOB elevated     General bed mobility comments: Requires assist with LEs, trunk and to scoot bottom to EOB. Posterior bias.   Transfers Overall transfer level: Needs assistance Equipment used: Rolling [place] (2 wheeled);2 person hand held assist Transfers: Sit to/from Stand Sit to Stand:  Mod assist;+2 safety/equipment;From elevated surface         General transfer comment: ASsist to power to standing initially with RW for support, ditched RW once up as pt pushing it too far anterior with posterior lean through hips.  Ambulation/Gait Ambulation/Gait assistance: Mod assist;+2 physical assistance Gait Distance (Feet): 3 Feet Assistive device: 2 person hand held assist Gait Pattern/deviations: Trunk flexed;Narrow base of support;Leaning posteriorly;Step-to pattern Gait velocity: decreased   General Gait Details: Able to take a few steps to chair with heavy assist for weight shifting, sequencing and movement; difficulty motor planning LEs, posterior lean through hips.  Stairs            Wheelchair Mobility    Modified Rankin (Stroke Patients Only)       Balance Overall balance assessment: Needs assistance Sitting-balance support: Feet supported;No upper extremity supported Sitting balance-Leahy Scale: Poor Sitting balance - Comments: Pt with LOB posteriorly on a few occasions needng Min A-Min guard for balance.  Postural control: Posterior lean;Left lateral lean Standing balance support: During functional activity Standing balance-Leahy Scale: Poor Standing balance comment: Requires external support in standing.                             Pertinent Vitals/Pain Pain Assessment: Faces Faces Pain Scale: Hurts even more Pain Location: hip with movement Pain Descriptors / Indicators: Grimacing;Guarding;Operative site guarding Pain Intervention(s): Monitored during session;Repositioned;Limited activity within patient's tolerance    Home Living Family/patient expects to be discharged to:: Skilled nursing facility Living Arrangements: Alone;Other (Comment)(private sitters) Available Help at Discharge: Personal  care attendant;Available PRN/intermittently Type of Home: House         Home Equipment: [place] - 2 wheels;Cane - single point       Prior Function Level of Independence: Needs assistance         Comments: Per chart, pt uses furniture for walking; has private sitters, not sure how long. per notes, pt has RW and SPC but forgets to use them.      Hand Dominance        Extremity/Trunk Assessment   Upper Extremity Assessment Upper Extremity Assessment: Defer to OT evaluation    Lower Extremity Assessment Lower Extremity Assessment: Difficult to assess due to impaired cognition(Able to perform LAQ BLEs with some difficulty. )    Cervical / Trunk Assessment Cervical / Trunk Assessment: Kyphotic  Communication   Communication: Expressive difficulties;Receptive difficulties  Cognition Arousal/Alertness: Awake/alert Behavior During Therapy: Flat affect Overall Cognitive Status: History of cognitive impairments - at baseline Area of Impairment: Orientation;Following commands;Problem solving;Safety/judgement;Awareness;Memory                 Orientation Level: Disoriented to;Person;Place;Time;Situation   Memory: Decreased short-term memory Following Commands: Follows one step commands inconsistently;Follows one step commands with increased time(multi modal cues) Safety/Judgement: Decreased awareness of safety;Decreased awareness of deficits Awareness: Intellectual(pre intellectual) Problem Solving: Decreased initiation;Difficulty sequencing;Slow processing;Requires verbal cues;Requires tactile cues General Comments: Pt not oriented to even person today. Perseverates on "yes". Follows a few 1 step simple commands inconsistently needing repetition and tactile cues.       General Comments      Exercises General Exercises - Lower Extremity Long Arc Quad: AROM;Both;5 reps   Assessment/Plan    PT Assessment Patient needs continued PT services  PT Problem List Decreased strength;Decreased balance;Decreased cognition;Decreased knowledge of precautions;Pain;Decreased knowledge of use of DME;Decreased  mobility;Decreased range of motion;Decreased activity tolerance;Decreased skin integrity;Decreased safety awareness       PT Treatment Interventions Functional mobility training;Balance training;Patient/family education;Gait training;Therapeutic activities;DME instruction;Therapeutic exercise;Cognitive remediation    PT Goals (Current goals can be found in the Care Plan section)  Acute Rehab PT Goals Patient Stated Goal: none stated PT Goal Formulation: Patient unable to participate in goal setting Time For Goal Achievement: 12/29/18 Potential to Achieve Goals: Fair    Frequency Min 3X/week   Barriers to discharge Decreased caregiver support lives alone    Co-evaluation PT/OT/SLP Co-Evaluation/Treatment: Yes Reason for Co-Treatment: Necessary to address cognition/behavior during functional activity;For patient/therapist safety;To address functional/ADL transfers PT goals addressed during session: Mobility/safety with mobility;Balance         AM-PAC PT "6 Clicks" Mobility  Outcome Measure Help needed turning from your back to your side while in a flat bed without using bedrails?: A Lot Help needed moving from lying on your back to sitting on the side of a flat bed without using bedrails?: A Lot Help needed moving to and from a bed to a chair (including a wheelchair)?: A Lot Help needed standing up from a chair using your arms (e.g., wheelchair or bedside chair)?: A Lot Help needed to walk in hospital room?: A Lot Help needed climbing 3-5 steps with a railing? : Total 6 Click Score: 11    End of Session Equipment Utilized During Treatment: Gait belt Activity Tolerance: Patient tolerated treatment well Patient left: in chair;with call bell/phone within reach;with chair alarm set Nurse Communication: Mobility status PT Visit Diagnosis: Muscle weakness (generalized) (M62.81);Difficulty in walking, not elsewhere classified (R26.2);Unsteadiness on feet (R26.81);Other abnormalities  of gait and mobility (R26.89);History of  falling (Z91.81);Pain Pain - Right/Left: Right Pain - part of body: Leg    Time: 0300-9233 PT Time Calculation (min) (ACUTE ONLY): 17 min   Charges:   PT Evaluation $PT Eval Moderate Complexity: 1 Mod          Mylo Red, PT, DPT Acute Rehabilitation Services Pager [phone] Office [phone]      Blake Divine A Lanier Ensign 12/15/2018, 8:29 AM

## 2018-12-15 NOTE — Progress Notes (Signed)
CSW acknowledges SNF consult and preference for [facility]. Due to patient's disorientation, CSW attempted to contact daughter [name]. No answer, lvm.   Will continue to follow up for discharge planning to SNF.   [place], [place] [phone]

## 2018-12-16 ENCOUNTER — Encounter (HOSPITAL_COMMUNITY): Payer: Self-pay | Admitting: Orthopedic Surgery

## 2018-12-16 LAB — BASIC METABOLIC PANEL
Anion gap: 11 (ref 5–15)
BUN: 26 mg/dL — ABNORMAL HIGH (ref 8–23)
CO2: 22 mmol/L (ref 22–32)
Calcium: 7.8 mg/dL — ABNORMAL LOW (ref 8.9–10.3)
Chloride: 105 mmol/L (ref 98–111)
Creatinine, Ser: 1.03 mg/dL — ABNORMAL HIGH (ref 0.44–1.00)
GFR calc Af Amer: 58 mL/min — ABNORMAL LOW (ref >60)
GFR calc non Af Amer: 50 mL/min — ABNORMAL LOW (ref >60)
Glucose, Bld: 142 mg/dL — ABNORMAL HIGH (ref 70–99)
Potassium: 3.4 mmol/L — ABNORMAL LOW (ref 3.5–5.1)
Sodium: 138 mmol/L (ref 135–145)

## 2018-12-16 LAB — CBC
HCT: 25 % — ABNORMAL LOW (ref 36.0–46.0)
Hemoglobin: 8 g/dL — ABNORMAL LOW (ref 12.0–15.0)
MCH: 29.1 pg (ref 26.0–34.0)
MCHC: 32 g/dL (ref 30.0–36.0)
MCV: 90.9 fL (ref 80.0–100.0)
Platelets: 96 10*3/uL — ABNORMAL LOW (ref 150–400)
RBC: 2.75 MIL/uL — ABNORMAL LOW (ref 3.87–5.11)
RDW: 14.7 % (ref 11.5–15.5)
WBC: 8.1 10*3/uL (ref 4.0–10.5)
nRBC: 0 % (ref 0.0–0.2)

## 2018-12-16 MED ORDER — POTASSIUM CHLORIDE CRYS ER 20 MEQ PO TBCR
40.0000 meq | EXTENDED_RELEASE_TABLET | Freq: Once | ORAL | Status: AC
  Administered 2018-12-16: 10:00:00 40 meq via ORAL
  Filled 2018-12-16: qty 2

## 2018-12-16 NOTE — TOC Progression Note (Signed)
Transition of Care [facility]) - Progression Note    Patient Details  Name: Amy Randall MRN: [id number] Date of Birth: 01/11/1934  Transition of Care [facility] CM/SW Contact  Doy Hutching, [place] Phone Number: 12/16/2018, 4:18 PM  Clinical Narrative:    Spoke with pt daughter [name] again. We discussed pt current needs, referrals, and plan for disposition. Pt daughter very interested if possible with home care. We discussed that generally that is not 24/7 care but offered potentially for pt to be screened by [facility]. Pt daughter given general overview for the program and that pt meets general requirements at this time. Pt daughter and private sitters would be able to give the pt the care and supervision requested by [facility]. Pt daughter knows this is not guaranteed but gave verbal permission for pt to be referred.    Referral given to [facility] with [facility] to screen for eligibility.  Confirmed PCP is Dr. Juanetta Gosling and that pt has multiple pieces of equipment- including but not limited to handrails, elevated toilet seat, shower chair, and [place].    Expected Discharge Plan: Skilled Nursing Facility Barriers to Discharge: Awaiting State Approval (PASRR), Continued Medical Work up  Expected Discharge Plan and Services Expected Discharge Plan: Skilled Nursing Facility In-house Referral: Clinical Social Work Discharge Planning Services: NA Post Acute Care Choice: Skilled Nursing Facility                     Social Determinants of Health (SDOH) Interventions    Readmission Risk Interventions No flowsheet data found.

## 2018-12-16 NOTE — Progress Notes (Addendum)
PROGRESS NOTE    Amy Randall  ZOX:[id number] DOB: 07-09-1934 DOA: 12/14/2018 PCP: [name], MD    Brief Narrative:  85 y.o. female with medical history significant of HTN; afib; and dementia presenting with a fall.  Patient has dementia and is not oriented to even person.  I spoke to her daughter, who provided the remaining history.  Her baseline is that she knows her name.  If she is upset, she will say that she does not know her name.  She has anxiety and panic attacks.  Her daughter estimates that she was outside for 1-2 hours in the cold - she might be stressed, hungry.  The family has cameras in the house.  Around 430, she went out the door.  She usually has to hold on to handrails and furniture to ambulate.  Her daughter thinks she fell and crawled.  She was found by neighbors several hours later.  Family is uncertain about where she will need to be placed - her family does not want her in [place].  Her daughter currently prefers [facility].  The patient has never voiced an opinion about resuscitation.  The daughter will get back to [place] on code status - she would prefer not to resuscitate but she will be in touch with her brother to make this decision.   ED Course:  Found in the street by neighbors, unknown length of time down.  Ortho consult pending for R hip fracture.    Assessment & Plan:   Principal Problem:   Hip fracture (HCC) Active Problems:   Essential hypertension   Dementia without behavioral disturbance (HCC)   AF (paroxysmal atrial fibrillation) (HCC)   Hyperglycemia    Hip fracture -Mechanical fall resulting in hip fracture -Orthopedics consulted -Pt underwent surgery 5/9 -SW consult for rehab placement, daughter reportedly prefered [facility] -Continue with PT as tolerated  HTN -Continue Cardizem and Lopressor -BP low AM of 5/10, requiring NS bolus -BP improved. Reduce fluids  Dementia -As noted above, significant safety concerns with  her home living situation; consider APS consult -Continue Exelon as tolerated -Seems to be stable at present  Afib, chronic -Cardizem and Lopressor, will continue -She is not taking AC -Rate controlled at present  Hyperglycemia -May be stress response -Random glucose stable  Acute blood loss anemia  Severe protein calorie malnutrition  DVT prophylaxis: SCD's Code Status: Full Family Communication: Pt in room, family not at bedside Disposition Plan: SNF, timing uncertain  Consultants:   Orthopedic Surgery  Procedures:   R hip fracture surgery 5/9  Antimicrobials: Anti-infectives (From admission, onward)   Start     Dose/Rate Route Frequency Ordered Stop   12/14/18 1930  ceFAZolin (ANCEF) IVPB 1 g/50 mL premix     1 g 100 mL/hr over 30 Minutes Intravenous Every 6 hours 12/14/18 1447 12/15/18 0701   12/14/18 1015  ceFAZolin (ANCEF) IVPB 2g/100 mL premix     2 g 200 mL/hr over 30 Minutes Intravenous On call to O.R. 12/14/18 1010 12/14/18 1305      Subjective: Complaining of nausea this AM  Objective: Vitals:   12/15/18 1920 12/16/18 0338 12/16/18 1033 12/16/18 1230  BP: 104/66 (!) 106/58 123/80 111/86  Pulse: 67 61 80 69  Resp: Temp: 98.8 F (37.1 C) 98.7 F (37.1 C)  100 F (37.8 C)  TempSrc: Oral Oral  Oral  SpO2: 98% 97%  99%  Weight:      Height:  Intake/Output Summary (Last 24 hours) at 12/16/2018 1616 Last data filed at 12/15/2018 2259 Gross per 24 hour  Intake 240 ml  Output -  Net 240 ml   Filed Weights   12/14/18 1009  Weight: 45.8 kg    Examination: General exam: Awake, laying in bed, in nad Respiratory system: Normal respiratory effort, no wheezing Cardiovascular system: regular rate, s1, s2 Gastrointestinal system: Soft, nondistended, positive BS Central nervous system: CN2-12 grossly intact, strength intact Extremities: Perfused, no clubbing Skin: Normal skin turgor, no notable skin lesions seen Psychiatry:  difficult to assess given baseline dementia   Data Reviewed: I have personally reviewed following labs and imaging studies  CBC: Recent Labs  Lab 12/14/18 0719 12/15/18 0232 12/16/18 0335  WBC 11.1* 8.4 8.1  NEUTROABS 9.2*  --   --   HGB 11.9* 8.6* 8.0*  HCT 37.0 26.6* 25.0*  MCV 91.4 90.5 90.9  PLT 167 112* 96*   Basic Metabolic Panel: Recent Labs  Lab 12/14/18 0719 12/14/18 1047 12/15/18 0232 12/16/18 0335  NA 139 140 137 138  K 5.6* 3.5 3.7 3.4*  CL 105 105 102 105  CO2 22 22 24 22   GLUCOSE 197* 113* 121* 142*  BUN 21 20 22  26*  CREATININE 1.05* 0.85 1.14* 1.03*  CALCIUM 8.7* 8.5* 8.2* 7.8*   GFR: Estimated Creatinine Clearance: 29.9 mL/min (A) (by C-G formula based on SCr of 1.03 mg/dL (H)). Liver Function Tests: No results for input(s): AST, ALT, ALKPHOS, BILITOT, PROT, ALBUMIN in the last 168 hours. No results for input(s): LIPASE, AMYLASE in the last 168 hours. No results for input(s): AMMONIA in the last 168 hours. Coagulation Profile: Recent Labs  Lab 12/14/18 0858  INR 1.1   Cardiac Enzymes: No results for input(s): CKTOTAL, CKMB, CKMBINDEX, TROPONINI in the last 168 hours. BNP (last 3 results) No results for input(s): PROBNP in the last 8760 hours. HbA1C: No results for input(s): HGBA1C in the last 72 hours. CBG: No results for input(s): GLUCAP in the last 168 hours. Lipid Profile: No results for input(s): CHOL, HDL, LDLCALC, TRIG, CHOLHDL, LDLDIRECT in the last 72 hours. Thyroid Function Tests: No results for input(s): TSH, T4TOTAL, FREET4, T3FREE, THYROIDAB in the last 72 hours. Anemia Panel: No results for input(s): VITAMINB12, FOLATE, FERRITIN, TIBC, IRON, RETICCTPCT in the last 72 hours. Sepsis Labs: No results for input(s): PROCALCITON, LATICACIDVEN in the last 168 hours.  Recent Results (from the past 240 hour(s))  SARS Coronavirus 2 (CEPHEID - Performed in [facility] hospital lab), Hosp Order     Status: None   Collection Time:  12/14/18  7:32 AM  Result Value Ref Range Status   SARS Coronavirus 2 NEGATIVE NEGATIVE Final    Comment: (NOTE) If result is NEGATIVE SARS-CoV-2 target nucleic acids are NOT DETECTED. The SARS-CoV-2 RNA is generally detectable in upper and lower  respiratory specimens during the acute phase of infection. The lowest  concentration of SARS-CoV-2 viral copies this assay can detect is 250  copies / mL. A negative result does not preclude SARS-CoV-2 infection  and should not be used as the sole basis for treatment or other  patient management decisions.  A negative result may occur with  improper specimen collection / handling, submission of specimen other  than nasopharyngeal swab, presence of viral mutation(s) within the  areas targeted by this assay, and inadequate number of viral copies  (<250 copies / mL). A negative result must be combined with clinical  observations, patient history, and epidemiological information.  If result is POSITIVE SARS-CoV-2 target nucleic acids are DETECTED. The SARS-CoV-2 RNA is generally detectable in upper and lower  respiratory specimens dur ing the acute phase of infection.  Positive  results are indicative of active infection with SARS-CoV-2.  Clinical  correlation with patient history and other diagnostic information is  necessary to determine patient infection status.  Positive results do  not rule out bacterial infection or co-infection with other viruses. If result is PRESUMPTIVE POSTIVE SARS-CoV-2 nucleic acids MAY BE PRESENT.   A presumptive positive result was obtained on the submitted specimen  and confirmed on repeat testing.  While 2019 novel coronavirus  (SARS-CoV-2) nucleic acids may be present in the submitted sample  additional confirmatory testing may be necessary for epidemiological  and / or clinical management purposes  to differentiate between  SARS-CoV-2 and other Sarbecovirus currently known to infect humans.  If clinically  indicated additional testing with an alternate test  methodology [id number]) is advised. The SARS-CoV-2 RNA is generally  detectable in upper and lower respiratory sp ecimens during the acute  phase of infection. The expected result is Negative. Fact Sheet for Patients:  BoilerBrush.com.cyhttps://www.fda.gov/media/136312/download Fact Sheet for Healthcare Providers: https://pope.com/https://www.fda.gov/media/136313/download This test is not yet approved or cleared by the [place] FDA and has been authorized for detection and/or diagnosis of SARS-CoV-2 by FDA under an Emergency Use Authorization (EUA).  This EUA will remain in effect (meaning this test can be used) for the duration of the COVID-19 declaration under Section 564(b)(1) of the Act, 21 U.S.C. section 360bbb-3(b)(1), unless the authorization is terminated or revoked sooner. Performed at [facility], [address] [place], [place] [address]   Surgical pcr screen     Status: Abnormal   Collection Time: 12/14/18 10:31 AM  Result Value Ref Range Status   MRSA, PCR NEGATIVE NEGATIVE Final   Staphylococcus aureus POSITIVE (A) NEGATIVE Final    Comment: (NOTE) The Xpert SA Assay (FDA approved for NASAL specimens in patients 83 years of age and older), is one component of a comprehensive surveillance program. It is not intended to diagnose infection nor to guide or monitor treatment. Performed at [facility], [address] [place], [place] [address]      Radiology Studies: No results found.  Scheduled Meds: . aspirin EC  325 mg Oral Daily  . diltiazem  300 mg Oral Daily  . docusate sodium  100 mg Oral BID  . feeding supplement (ENSURE ENLIVE)  237 mL Oral BID BM  . metoprolol tartrate  25 mg Oral BID  . multivitamin  15 mL Oral Daily  . rivastigmine  9.5 mg Transdermal Daily  . sertraline  12.5 mg Oral Daily   Continuous Infusions: . sodium chloride 75 mL/hr at 12/16/18 0821  . methocarbamol (ROBAXIN) IV       LOS: 2 days    Rickey BarbaraStephen Amybeth Sieg, MD [facility] Pager On Amion  If 7PM-7AM, please contact night-coverage 12/16/2018, 4:16 PM

## 2018-12-16 NOTE — Progress Notes (Signed)
Physical Therapy Treatment Patient Details Name: Amy Randall MRN: [id number] DOB: 06-12-1934 Today's Date: 12/16/2018    History of Present Illness Patient is a 85 y/o female who presents with left hip fx s/p fall, found down in the street by neighbors now s/p IM nail right hip. PMH includes HTN. A-fib. dementia.    PT Comments    Pt mobility limited today by persistent diarrhea in addition to her avoidance of putting wt on RLE to step LLE. Mod A +2 needed to get to EOB and pivot to chair. Unable to progress ambulation. VSS. PT will continue to follow.   Follow Up Recommendations  SNF;Supervision for mobility/OOB;Supervision/Assistance - 24 hour     Equipment Recommendations  None recommended by PT    Recommendations for Other Services       Precautions / Restrictions Precautions Precautions: Fall Precaution Comments: hx of dementia Restrictions Weight Bearing Restrictions: Yes RLE Weight Bearing: Weight bearing as tolerated    Mobility  Bed Mobility Overal bed mobility: Needs Assistance Bed Mobility: Supine to Sit     Supine to sit: Mod assist;+2 for physical assistance;HOB elevated     General bed mobility comments: pt assists with reach across bed to rail but max A needed to move LE's off bed, mod A for trunk elevation to sitting and mod A to scoot to EOB  Transfers Overall transfer level: Needs assistance Equipment used: Rolling Heinrichs (2 wheeled);2 person hand held assist Transfers: Sit to/from [employer] CorporationStand;Stand Pivot Transfers Sit to Stand: Mod assist;+2 safety/equipment Stand pivot transfers: Mod assist;+2 physical assistance       General transfer comment: pt stood with mod A +2 for power up to RW. At that point, she was unable to tolerate wt on RLE to step LLE to ambulate fwd and in addition, was continuing to have diarrhea. RW moved away and +2 HHA used to assist pt with pivoting to recliner.   Ambulation/Gait         Gait velocity: decreased Gait  velocity interpretation: <1.31 ft/sec, indicative of household ambulator General Gait Details: unable due to persistent diarrhea and avoidance of putting wt on RLE to step LLE despite verbal and tactile cues.    Stairs             Wheelchair Mobility    Modified Rankin (Stroke Patients Only)       Balance Overall balance assessment: Needs assistance Sitting-balance support: Feet supported;No upper extremity supported Sitting balance-Leahy Scale: Poor Sitting balance - Comments: posterior lean   Standing balance support: During functional activity Standing balance-Leahy Scale: Poor Standing balance comment: Requires external support in standing.                            Cognition Arousal/Alertness: Awake/alert Behavior During Therapy: Flat affect Overall Cognitive Status: History of cognitive impairments - at baseline Area of Impairment: Orientation;Following commands;Problem solving;Safety/judgement;Awareness;Memory                 Orientation Level: Disoriented to;Person;Place;Time;Situation   Memory: Decreased short-term memory Following Commands: Follows one step commands inconsistently;Follows one step commands with increased time(multi modal cues) Safety/Judgement: Decreased awareness of safety;Decreased awareness of deficits Awareness: Intellectual(pre intellectual) Problem Solving: Decreased initiation;Difficulty sequencing;Slow processing;Requires verbal cues;Requires tactile cues        Exercises General Exercises - Lower Extremity Ankle Circles/Pumps: AROM;Both;10 reps;Seated Long Arc Quad: AROM;10 reps;Right    General Comments        Pertinent Vitals/Pain Pain  Assessment: Faces Faces Pain Scale: Hurts little more Pain Location: R hip with movement Pain Descriptors / Indicators: Grimacing;Guarding;Operative site guarding Pain Intervention(s): Limited activity within patient's tolerance;Monitored during session;Repositioned     Home Living                      Prior Function            PT Goals (current goals can now be found in the care plan section) Acute Rehab PT Goals Patient Stated Goal: none stated PT Goal Formulation: Patient unable to participate in goal setting Time For Goal Achievement: 12/29/18 Potential to Achieve Goals: Fair Progress towards PT goals: Progressing toward goals    Frequency    Min 3X/week      PT Plan Current plan remains appropriate    Co-evaluation              AM-PAC PT "6 Clicks" Mobility   Outcome Measure  Help needed turning from your back to your side while in a flat bed without using bedrails?: A Lot Help needed moving from lying on your back to sitting on the side of a flat bed without using bedrails?: A Lot Help needed moving to and from a bed to a chair (including a wheelchair)?: A Lot Help needed standing up from a chair using your arms (e.g., wheelchair or bedside chair)?: A Lot Help needed to walk in hospital room?: Total Help needed climbing 3-5 steps with a railing? : Total 6 Click Score: 10    End of Session Equipment Utilized During Treatment: Gait belt Activity Tolerance: Patient tolerated treatment well Patient left: in chair;with call bell/phone within reach;with chair alarm set Nurse Communication: Mobility status PT Visit Diagnosis: Muscle weakness (generalized) (M62.81);Difficulty in walking, not elsewhere classified (R26.2);Unsteadiness on feet (R26.81);Other abnormalities of gait and mobility (R26.89);History of falling (Z91.81);Pain Pain - Right/Left: Right Pain - part of body: Leg     Time: 1200-1219 PT Time Calculation (min) (ACUTE ONLY): 19 min  Charges:  $Therapeutic Activity: 8-22 mins                     Lyanne Co, PT  Acute Rehab Services  Pager [phone] Office [phone]    Lawana Chambers Rockland Kotarski 12/16/2018, 1:28 PM

## 2018-12-16 NOTE — Plan of Care (Signed)
  Problem: Clinical Measurements: Goal: Ability to maintain clinical measurements within normal limits will improve Outcome: Progressing   Problem: Clinical Measurements: Goal: Will remain free from infection Outcome: Progressing   Problem: Clinical Measurements: Goal: Cardiovascular complication will be avoided Outcome: Progressing   Problem: Pain Managment: Goal: General experience of comfort will improve Outcome: Progressing   

## 2018-12-16 NOTE — NC FL2 (Signed)
[place] MEDICAID FL2 LEVEL OF CARE SCREENING TOOL     IDENTIFICATION  Patient Name: Amy Randall Birthdate: [birth year] Sex: female Admission Date (Current Location): 12/14/2018  [facility] and [place] Number:  Producer, television/film/video and Address:  [facility], [address] [place], [place] [address]      Provider Number: [id number]  Attending Physician Name and Address:  Jerald Kief, MD  Relative Name and Phone Number:  Laurence Spates, daughter    Current Level of Care: Hospital Recommended Level of Care: Skilled Nursing Facility Prior Approval Number:    Date Approved/Denied:   PASRR Number: [id number]  Discharge Plan: SNF    Current Diagnoses: Patient Active Problem List   Diagnosis Date Noted  . Hip fracture (HCC) 12/14/2018  . Essential hypertension 12/14/2018  . Dementia without behavioral disturbance (HCC) 12/14/2018  . AF (paroxysmal atrial fibrillation) (HCC) 12/14/2018  . Hyperglycemia 12/14/2018    Orientation RESPIRATION BLADDER Height & Weight     Self  Normal Continent, External catheter Weight: 100 lb 15.5 oz (45.8 kg) Height:  5\' 1"  (154.9 cm)  BEHAVIORAL SYMPTOMS/MOOD NEUROLOGICAL BOWEL NUTRITION STATUS      Continent Diet(see discharge summary)  AMBULATORY STATUS COMMUNICATION OF NEEDS Skin   Extensive Assist Verbally Surgical wounds(incision on right hip with gauze to be changed daily; ecchymosis on right shoulder)                       Personal Care Assistance Level of Assistance  Bathing, Feeding, Dressing Bathing Assistance: Maximum assistance Feeding assistance: Limited assistance Dressing Assistance: Maximum assistance     Functional Limitations Info  Sight, Speech, Hearing Sight Info: Adequate Hearing Info: Adequate Speech Info: Adequate    SPECIAL CARE FACTORS FREQUENCY  PT (By licensed PT), OT (By licensed OT)     PT Frequency: 5x week OT Frequency: 5x week            Contractures  Contractures Info: Not present    Additional Factors Info  Code Status, Allergies, Psychotropic Code Status Info: Full Code Allergies Info: BACTRIM SULFAMETHOXAZOLE-TRIMETHOPRIM, IBUPROFEN, OR[place] FR[place], PENICILLINS  Psychotropic Info: sertraline (ZOLOFT) tablet 12.5 mg daily PO         Current Medications (12/16/2018):  This is the current hospital active medication list Current Facility-Administered Medications  Medication Dose Route Frequency Provider Last Rate Last Dose  . 0.9 %  sodium chloride infusion   Intravenous Continuous Jerald Kief, MD 75 mL/hr at 12/16/18 [phone]    . acetaminophen (TYLENOL) tablet 325-650 mg  325-650 mg Oral Q6H PRN Yolonda Kida, MD      . aspirin EC tablet 325 mg  325 mg Oral Daily Yolonda Kida, MD   325 mg at 12/16/18 1026  . bisacodyl (DULCOLAX) EC tablet 5 mg  5 mg Oral Daily PRN Yolonda Kida, MD      . diltiazem [facility] CD) 24 hr capsule 300 mg  300 mg Oral Daily Yolonda Kida, MD   300 mg at 12/16/18 1025  . docusate sodium (COLACE) capsule 100 mg  100 mg Oral BID Yolonda Kida, MD   100 mg at 12/16/18 1026  . feeding supplement (ENSURE ENLIVE) (ENSURE ENLIVE) liquid 237 mL  237 mL Oral BID BM Yolonda Kida, MD   237 mL at 12/15/18 1413  . HYDROcodone-acetaminophen (NORCO) 7.5-325 MG per tablet 1-2 tablet  1-2 tablet Oral Q4H PRN Yolonda Kida, MD      .  HYDROcodone-acetaminophen (NORCO/VICODIN) 5-325 MG per tablet 1-2 tablet  1-2 tablet Oral Q4H PRN Yolonda Kidaogers, Jason Patrick, MD      . methocarbamol (ROBAXIN) tablet 500 mg  500 mg Oral Q6H PRN Yolonda Kidaogers, Jason Patrick, MD   500 mg at 12/15/18 1002   Or  . methocarbamol (ROBAXIN) 500 mg in dextrose 5 % 50 mL IVPB  500 mg Intravenous Q6H PRN Yolonda Kidaogers, Jason Patrick, MD      . metoCLOPramide [facility](REGLAN) tablet 5-10 mg  5-10 mg Oral Q8H PRN Yolonda Kidaogers, Jason Patrick, MD       Or  . metoCLOPramide [facility](REGLAN) injection 5-10 mg  5-10 mg Intravenous Q8H PRN  Yolonda Kidaogers, Jason Patrick, MD      . metoprolol tartrate (LOPRESSOR) tablet 25 mg  25 mg Oral BID Yolonda Kidaogers, Jason Patrick, MD   25 mg at 12/16/18 1026  . morphine 2 MG/ML injection 0.5-1 mg  0.5-1 mg Intravenous Q2H PRN Yolonda Kidaogers, Jason Patrick, MD      . multivitamin liquid 15 mL  15 mL Oral Daily Yolonda Kidaogers, Jason Patrick, MD   15 mL at 12/15/18 1755  . ondansetron (ZOFRAN) tablet 4 mg  4 mg Oral Q6H PRN Yolonda Kidaogers, Jason Patrick, MD       Or  . ondansetron [facility](ZOFRAN) injection 4 mg  4 mg Intravenous Q6H PRN Yolonda Kidaogers, Jason Patrick, MD   4 mg at 12/16/18 0819  . polyethylene glycol (MIRALAX / GLYCOLAX) packet 17 g  17 g Oral Daily PRN Yolonda Kidaogers, Jason Patrick, MD      . rivastigmine (EXELON) 9.5 mg/24hr 9.5 mg  9.5 mg Transdermal Daily Yolonda Kidaogers, Jason Patrick, MD   9.5 mg at 12/16/18 1026  . sertraline (ZOLOFT) tablet 12.5 mg  12.5 mg Oral Daily Yolonda Kidaogers, Jason Patrick, MD   12.5 mg at 12/15/18 [id number]     Discharge Medications: Please see discharge summary for a list of discharge medications.  Relevant Imaging Results:  Relevant Lab Results:   Additional Information SS#[id number] [address]  Telesha Deguzman H [place], [place]

## 2018-12-16 NOTE — TOC Initial Note (Signed)
Transition of Care [facility]) - Initial/Assessment Note    Patient Details  Name: Amy Randall MRN: [id number] Date of Birth: 1934-05-24  Transition of Care [facility]) CM/SW Contact:    Doy Hutching, LCSWA Phone Number: 12/16/2018, 11:37 AM  Clinical Narrative:                 CSW spoke with pt daughter [name] on phone. Introduced self, role, reason for call. Pt daughter and son have assisted pt prior to admission but she was relatively independent and not requiring much assistance. Pt daughter states they initially wanted [facility] but they have changed their mind due to current outbreak. They now are interested in [facility] but also give permission to fax out to other SNFs for offers. Pt daughter can be reached at [email or web address] when offers available. Continuing to follow.   Expected Discharge Plan: Skilled Nursing Facility Barriers to Discharge: Awaiting State Approval (PASRR), Continued Medical Work up   Patient Goals and CMS Choice Patient states their goals for this hospitalization and ongoing recovery are:: for her to get therapy prior to going home- pt daughter CMS Medicare.gov Compare Post Acute Care list provided to:: Patient Represenative (must comment) Choice offered to / list presented to : Adult Children  Expected Discharge Plan and Services Expected Discharge Plan: Skilled Nursing Facility In-house Referral: Clinical Social Work Discharge Planning Services: NA Post Acute Care Choice: Skilled Nursing Facility                                        Prior Living Arrangements/Services   Lives with:: Self Patient language and need for interpreter reviewed:: No Do you feel safe going back to the place where you live?: Yes      Need for Family Participation in Patient Care: Yes (Comment)(decision making) Care giver support system in place?: Yes (comment)(pt adult children) Current home services: DME Criminal Activity/Legal Involvement Pertinent to  Current Situation/Hospitalization: No - Comment as needed  Activities of Daily Living Home Assistive Devices/Equipment: Bedside commode/3-in-1, Raised toilet seat with rails, [place] (specify type), Wheelchair ADL Screening (condition at time of admission) Patient's cognitive ability adequate to safely complete daily activities?: No Is the patient deaf or have difficulty hearing?: No Does the patient have difficulty seeing, even when wearing glasses/contacts?: No Does the patient have difficulty concentrating, remembering, or making decisions?: Yes Patient able to express need for assistance with ADLs?: No Does the patient have difficulty dressing or bathing?: Yes Independently performs ADLs?: No Does the patient have difficulty walking or climbing stairs?: Yes Weakness of Legs: Both Weakness of Arms/Hands: Both  Permission Sought/Granted Permission sought to share information with : Family Supports, Oceanographer granted to share information with : Yes, Verbal Permission Granted  Share Information with NAME: Laurence Spates  Permission granted to share info w AGENCY: SNFs- preference for [facility]  Permission granted to share info w Relationship: daughter  Permission granted to share info w Contact Information: [phone]  Emotional Assessment Appearance:: Other (Comment Required(spoke with daughter on phone) Attitude/Demeanor/Rapport: Unable to Assess Affect (typically observed): Unable to Assess Orientation: : Oriented to Self Alcohol / Substance Use: Not Applicable Psych Involvement: No (comment)  Admission diagnosis:  Confusion [R41.0] Fall [W19.XXXA] Closed right hip fracture, initial encounter [facility]) [S72.001A] Patient Active Problem List   Diagnosis Date Noted  . Hip fracture (HCC) 12/14/2018  . Essential hypertension 12/14/2018  .  Dementia without behavioral disturbance (HCC) 12/14/2018  . AF (paroxysmal atrial fibrillation) (HCC)  12/14/2018  . Hyperglycemia 12/14/2018   PCP:  Kari BaarsHawkins, Edward, MD Pharmacy:   [facility] [place] [address] [place] [id number] Phone: [phone] Fax: [phone]  [facility] [address] [place] [id number] Phone: [phone]      Social Determinants of Health (SDOH) Interventions    Readmission Risk Interventions No flowsheet data found.

## 2018-12-17 MED ORDER — DOCUSATE SODIUM 100 MG PO CAPS: 100.0000 mg | ORAL_CAPSULE | Freq: Two times a day (BID) | ORAL | 0 refills | Status: DC

## 2018-12-17 MED ORDER — ASPIRIN 325 MG PO TBEC: 325.0000 mg | DELAYED_RELEASE_TABLET | Freq: Every day | ORAL | 0 refills | Status: DC

## 2018-12-17 MED FILL — DOK 100 MG CAPS: 100 | 15 days supply | Qty: 30 | Fill #0

## 2018-12-17 MED FILL — ASPIRIN EC 325 MG TABLET: 325 | 42 days supply | Qty: 42 | Fill #0

## 2018-12-17 NOTE — TOC Transition Note (Addendum)
Transition of Care [facility]) - CM/SW Discharge Note   Patient Details  Name: Amy Randall MRN: [id number] Date of Birth: 09/05/1933  Transition of Care [facility]) CM/SW Contact:  Doy Hutching, LCSWA Phone Number: 12/17/2018, 2:13 PM   Clinical Narrative:    CSW received call from Wayne Both with [facility], pt accepted and approved. Spoke with [name] through [facility] who also acknowledged that referral had been received. Pt daughter is aware pt stable for d/c and requested that CSW call at 3pm to schedule PTAR time. Pt daughter also raised a concern with pt potentially not having assistance with ordering food yesterday and that MD team had not called since Saturday. Addressed concerns with MD and RN Assistant Director Alona Bene. Will complete PTAR papers and call daughter at 3pm.   Orders placed for face to face, PT, OT, SW, RN.   Final next level of care: Home w Home Health Services Barriers to Discharge: Barriers Resolved   Patient Goals and CMS Choice Patient states their goals for this hospitalization and ongoing recovery are:: for her to get therapy prior to going home- pt daughter CMS Medicare.gov Compare Post Acute Care list provided to:: Patient Represenative (must comment) Choice offered to / list presented to : Adult Children  Discharge Placement                Patient to be transferred to facility by: PTAR Name of family member notified: pt daughter [name] Patient and family notified of of transfer: 12/17/18  Discharge Plan and Services In-house Referral: Clinical Social Work Discharge Planning Services: NA Post Acute Care Choice: Skilled Nursing Facility                      HH Agency: [facility] Date [facility] Agency Contacted: 12/16/18 Time HH Agency Contacted: 1622 Representative spoke with at [facility] Agency: Lorenza Chick  Social Determinants of Health (SDOH) Interventions     Readmission Risk Interventions No flowsheet data found.

## 2018-12-17 NOTE — Social Work (Signed)
Clinical Social Worker facilitated patient discharge including contacting patient family and Frances Furbish to confirm patient discharge plans.  Clinical information given to [facility] and family agreeable with plan.  CSW arranged ambulance transport via PTAR to [address] Sidney Ace, [place]. Pick up scheduled at daughters request for 4:45pm.  Clinical Social Worker will sign off for now as social work intervention is no longer needed. Please consult [place] again if new need arises.  Octavio Graves, MSW, [facility] Clinical Social Worker [phone]

## 2018-12-17 NOTE — Progress Notes (Signed)
AVS and social worker's paperwork placed in discharge packet for PTAR. Pt transported home via PTAR.

## 2018-12-17 NOTE — Consult Note (Addendum)
   [facility] CM Inpatient Consult   12/17/2018  NAKAYAH BONINI Oct 06, 1933 [id number]  1700: Patient/family accepted the [facility] program  Referral to discuss [facility] Care Management involvement in the Medicare Next Gen benefits. Patient meet the requirements [facility] First program for post hospital personal care needs. Confirmed with inpatient TOC LCSW of patient's primary care provider:  Dr. [name]. Will follow up with [facility] Care Management team and with [facility] from [facility] pending outcome of referral.  For questions, please contact:  Charlesetta Shanks, RN BSN CCM [facility]  [phone] business mobile phone Toll free office [phone]  Fax number: [phone] [name]  [email or web address]    For questions

## 2018-12-17 NOTE — Progress Notes (Signed)
Physical Therapy Treatment Patient Details Name: Amy Randall MRN: [id number] DOB: August 04, 1934 Today's Date: 12/17/2018    History of Present Illness Patient is a 85 y/o female who presents with left hip fx s/p fall, found down in the street by neighbors now s/p IM nail right hip. PMH includes HTN. A-fib. dementia.    PT Comments    Pt making good progress with gait today. Had some stool with initial standing but not diarrhea. Attemtped ambulation with RW but pt fearful of stepping LLE and putting wt on RLE. Once RW removed and pt had arm in arm support of 2, she was more confident to take steps and ambulated 5' fwd with mod A +2. She also tolerated static standing x5 mins with RW. PT will continue to follow.    Follow Up Recommendations  SNF;Supervision for mobility/OOB;Supervision/Assistance - 24 hour     Equipment Recommendations  None recommended by PT    Recommendations for Other Services       Precautions / Restrictions Precautions Precautions: Fall Precaution Comments: hx of dementia Restrictions Weight Bearing Restrictions: Yes RLE Weight Bearing: Weight bearing as tolerated    Mobility  Bed Mobility               General bed mobility comments: pt in chair  Transfers Overall transfer level: Needs assistance Equipment used: Rolling [place] (2 wheeled);2 person hand held assist Transfers: Sit to/from Stand;Stand Pivot Transfers Sit to Stand: Min assist;+2 safety/equipment         General transfer comment: pt needs min A for fwd wt shift but then initiates standing on her own. Min A for support  Ambulation/Gait Ambulation/Gait assistance: Mod assist;+2 physical assistance Gait Distance (Feet): 5 Feet Assistive device: 2 person hand held assist Gait Pattern/deviations: Narrow base of support;Step-to pattern Gait velocity: decreased Gait velocity interpretation: <1.31 ft/sec, indicative of household ambulator General Gait Details: attempted ambulation  with RW but pt still unable (seemed fearful to trunk RLE) to step LLE. Removed RW and gave pt arm in arm assist and she was able to ambulate 5' fwd with narrow BOS.    Stairs             Wheelchair Mobility    Modified Rankin (Stroke Patients Only)       Balance Overall balance assessment: Needs assistance Sitting-balance support: Feet supported;No upper extremity supported Sitting balance-Leahy Scale: Poor Sitting balance - Comments: posterior lean Postural control: Posterior lean Standing balance support: During functional activity Standing balance-Leahy Scale: Poor Standing balance comment: Requires external support in standing.                            Cognition Arousal/Alertness: Awake/alert Behavior During Therapy: Flat affect Overall Cognitive Status: History of cognitive impairments - at baseline Area of Impairment: Orientation;Following commands;Problem solving;Safety/judgement;Awareness;Memory                 Orientation Level: Disoriented to;Person;Place;Time;Situation   Memory: Decreased short-term memory Following Commands: Follows one step commands inconsistently;Follows one step commands with increased time(multi modal cues) Safety/Judgement: Decreased awareness of safety;Decreased awareness of deficits Awareness: Intellectual Problem Solving: Decreased initiation;Difficulty sequencing;Slow processing;Requires verbal cues;Requires tactile cues General Comments: pt oriented to self today and reports that she knowd she hurt her L leg      Exercises General Exercises - Lower Extremity Ankle Circles/Pumps: AROM;Both;10 reps;Seated Long Arc Quad: AROM;10 reps;Right;Seated Hip ABduction/ADduction: AAROM;Right;10 reps;Seated    General Comments General comments (skin integrity,  edema, etc.): pt with small amount of stool with initial standing, cleaned before returning to recliner      Pertinent Vitals/Pain Pain Assessment:  Faces Faces Pain Scale: Hurts little more Pain Location: R hip with movement Pain Descriptors / Indicators: Grimacing;Guarding;Operative site guarding Pain Intervention(s): Limited activity within patient's tolerance;Monitored during session    Home Living                      Prior Function            PT Goals (current goals can now be found in the care plan section) Acute Rehab PT Goals Patient Stated Goal: none stated PT Goal Formulation: Patient unable to participate in goal setting Time For Goal Achievement: 12/29/18 Potential to Achieve Goals: Fair Progress towards PT goals: Progressing toward goals    Frequency    Min 3X/week      PT Plan Current plan remains appropriate    Co-evaluation              AM-PAC PT "6 Clicks" Mobility   Outcome Measure  Help needed turning from your back to your side while in a flat bed without using bedrails?: A Lot Help needed moving from lying on your back to sitting on the side of a flat bed without using bedrails?: A Lot Help needed moving to and from a bed to a chair (including a wheelchair)?: A Lot Help needed standing up from a chair using your arms (e.g., wheelchair or bedside chair)?: A Lot Help needed to walk in hospital room?: Total Help needed climbing 3-5 steps with a railing? : Total 6 Click Score: 10    End of Session Equipment Utilized During Treatment: Gait belt Activity Tolerance: Patient tolerated treatment well Patient left: in chair;with call bell/phone within reach;with chair alarm set Nurse Communication: Mobility status PT Visit Diagnosis: Muscle weakness (generalized) (M62.81);Difficulty in walking, not elsewhere classified (R26.2);Unsteadiness on feet (R26.81);Other abnormalities of gait and mobility (R26.89);History of falling (Z91.81);Pain Pain - Right/Left: Right Pain - part of body: Leg     Time: 2620-3559 PT Time Calculation (min) (ACUTE ONLY): 19 min  Charges:  $Gait Training:  8-22 mins                     Lyanne Co, PT  Acute Rehab Services  Pager [phone] Office [phone]    Amy Randall 12/17/2018, 12:14 PM

## 2018-12-17 NOTE — Plan of Care (Signed)
Problem: Education: Goal: Knowledge of General Education information will improve Description Including pain rating scale, medication(s)/side effects and non-pharmacologic comfort measures Outcome: Progressing   Problem: Health Behavior/Discharge Planning: Goal: Ability to manage health-related needs will improve Outcome: Progressing   Problem: Clinical Measurements: Goal: Ability to maintain clinical measurements within normal limits will improve Outcome: Progressing Goal: Respiratory complications will improve Outcome: Progressing Goal: Cardiovascular complication will be avoided Outcome: Progressing   Problem: Activity: Goal: Risk for activity intolerance will decrease Outcome: Progressing   Problem: Nutrition: Goal: Adequate nutrition will be maintained Outcome: Progressing   Problem: Coping: Goal: Level of anxiety will decrease Outcome: Progressing   Problem: Pain Managment: Goal: General experience of comfort will improve Outcome: Progressing   Problem: Safety: Goal: Ability to remain free from injury will improve Outcome: Progressing   Problem: Skin Integrity: Goal: Risk for impaired skin integrity will decrease Outcome: Progressing   Problem: Activity: Goal: Ability to ambulate and perform ADLs will improve Outcome: Progressing

## 2018-12-17 NOTE — Progress Notes (Signed)
Transitions of care pharmacy delivered meds to bedside. Will continue to monitor.

## 2018-12-17 NOTE — Discharge Summary (Signed)
Physician Discharge Summary  Amy Randall UJW:[id number] DOB: 25-Nov-1933 DOA: 12/14/2018  PCP: [name], MD  Admit date: 12/14/2018 Discharge date: 12/17/2018  Admitted From: Home Disposition:  Home  Recommendations for Outpatient Follow-up:  1. Follow up with PCP in 2-3 weeks 2. Follow up with Orthopedic Surgery as scheduled  Home Health:PT/OT/RN/SW   Discharge Condition:improved CODE STATUS:Full Diet recommendation: Regular   Brief/Interim Summary: 85 y.o.femalewith medical history significant ofHTN; afib; and dementia presenting with a fall.Patient has dementia and is not oriented to even person. I spoke to her daughter, who provided the remaining history. Her baseline is that she knows her name. If she is upset, she will say that she does not know her name. She has anxiety and panic attacks. Her daughter estimates that she was outside for 1-2 hours in the cold - she might be stressed, hungry. The family has cameras in the house. Around 430, she went out the door. She usually has to hold on to handrails and furniture to ambulate. Her daughter thinks she fell and crawled. She was found by neighbors several hours later. Family is uncertain about where she will need to be placed - her family does not want her in [place]. Her daughter currently prefers [facility]. The patient has never voiced an opinion about resuscitation. The daughter will get back to [place] on code status - she would prefer not to resuscitate but she will be in touch with her brother to make this decision.  Discharge Diagnoses:  Principal Problem:   Hip fracture (HCC) Active Problems:   Essential hypertension   Dementia without behavioral disturbance (HCC)   AF (paroxysmal atrial fibrillation) (HCC)   Hyperglycemia   Hip fracture -Mechanical fall resulting in hip fracture -Orthopedics consulted -Pt underwent surgery 5/9 -SW consult for rehab placement. Discussed with SW, plan for  home with home health. Pt stable Will discharge today -Discussed with orthopedic surgery who recommends 6 weeks of ASA. Prescribed -Will defer post-op analgesia to orthopedic surgery  HTN -Continued Cardizem and Lopressor -BP low AM of 5/10, requiring NS bolus -BP improved.  Dementia -As noted above, significant safety concerns with her home living situation; consider APS consult -Continue Exelon as tolerated -Seems to be stable at present  Afib, chronic -Cardizem and Lopressor, will continue -She is not taking AC -Rate controlled at present  Hyperglycemia -May be stress response -Random glucose stable  Acute blood loss anemia -Remained hemodynamically stable  Severe protein calorie malnutrition   Discharge Instructions   Allergies as of 12/17/2018      Reactions   Bactrim [sulfamethoxazole-trimethoprim] Other (See Comments)   Loss of memory   Ibuprofen Other (See Comments)   Causes bruising   Orange Fruit [citrus]    Per patient's daughter, orange snesitivity   Penicillins Rash   Has patient had a PCN reaction causing immediate rash, facial/tongue/throat swelling, SOB or lightheadedness with hypotension: Yes Has patient had a PCN reaction causing severe rash involving mucus membranes or skin necrosis: No Has patient had a PCN reaction that required hospitalization: No Has patient had a PCN reaction occurring within the last 10 years: No If all of the above answers are "NO", then may proceed with Cephalosporin use.      Medication List    STOP taking these medications   potassium chloride 10 MEQ tablet Commonly known as:  K-DUR     TAKE these medications   acetaminophen 500 MG tablet Commonly known as:  TYLENOL Take 1,000 mg by mouth every  6 (six) hours as needed for mild pain (patient takes every extended release everyday at 2 p).   aspirin 325 MG EC tablet Take 1 tablet (325 mg total) by mouth daily. Start taking on:  Dec 18, 2018   diltiazem  300 MG 24 hr capsule Commonly known as:  Cardizem CD Take 1 capsule (300 mg total) by mouth daily.   docusate sodium 100 MG capsule Commonly known as:  COLACE Take 1 capsule (100 mg total) by mouth 2 (two) times daily.   metoprolol tartrate 25 MG tablet Commonly known as:  LOPRESSOR Take 25 mg by mouth 2 (two) times daily.   rivastigmine 9.5 mg/24hr Commonly known as:  EXELON Place 9.5 mg onto the skin daily.   sertraline 25 MG tablet Commonly known as:  ZOLOFT Take 12.5 mg by mouth daily.   Vitamin D3 50 MCG (2000 UT) Tabs Take 2,000 Units by mouth daily.      Follow-up Information    Yolonda Kida, MD. Schedule an appointment as soon as possible for a visit in 2 week(s).   Specialty:  Orthopedic Surgery Contact information: [address] [place] [address] [phone]        [name], MD. Schedule an appointment as soon as possible for a visit in 3 week(s).   Specialty:  Pulmonary Disease Contact information: [address] [place] [address] [phone]          Allergies  Allergen Reactions  . Bactrim [Sulfamethoxazole-Trimethoprim] Other (See Comments)    Loss of memory  . Ibuprofen Other (See Comments)    Causes bruising  . Orange Fruit [Citrus]     Per patient's daughter, orange snesitivity  . Penicillins Rash    Has patient had a PCN reaction causing immediate rash, facial/tongue/throat swelling, SOB or lightheadedness with hypotension: Yes Has patient had a PCN reaction causing severe rash involving mucus membranes or skin necrosis: No Has patient had a PCN reaction that required hospitalization: No Has patient had a PCN reaction occurring within the last 10 years: No If all of the above answers are "NO", then may proceed with Cephalosporin use.     Consultations:  Orthopedic Surgery  Procedures/Studies: Dg Elbow 2 Views Left  Result Date: 12/14/2018 CLINICAL DATA:  Fall. Elbow laceration. EXAM:  LEFT ELBOW - 2 VIEW COMPARISON:  None. FINDINGS: An IV catheter is noted in the antecubital region. Elevation of the anterior fat pad is compatible with a small elbow joint effusion. No acute fracture or dislocation is identified. IMPRESSION: Small elbow joint effusion without an acute fracture evident. Electronically Signed   By: Sebastian Ache M.D.   On: 12/14/2018 08:29   Ct Head Wo Contrast  Result Date: 12/14/2018 CLINICAL DATA:  Found on ground this morning. EXAM: CT HEAD WITHOUT CONTRAST CT CERVICAL SPINE WITHOUT CONTRAST TECHNIQUE: Multidetector CT imaging of the head and cervical spine was performed following the standard protocol without intravenous contrast. Multiplanar CT image reconstructions of the cervical spine were also generated. COMPARISON:  None. FINDINGS: CT HEAD FINDINGS Brain: There is no evidence of acute infarct, intracranial hemorrhage, mass, midline shift, or extra-axial fluid collection. Small chronic infarcts are present in the bilateral basal ganglia, left cerebellum, and potentially thalami. Confluent cerebral white matter hypodensities are nonspecific but compatible with moderate to severe chronic small vessel ischemic disease. There is moderately advanced cerebral atrophy. Vascular: Calcified atherosclerosis at the skull base. No hyperdense vessel. Skull: No fracture or focal osseous lesion. Sinuses/Orbits: Visualized paranasal sinuses  and mastoid air cells are clear. Visualized orbits are unremarkable. Other: None. CT CERVICAL SPINE FINDINGS Alignment: Mild reversal of the normal cervical lordosis. Minimal anterolisthesis of C2 on C3, C4 on C5, and C7 on T1. Skull base and vertebrae: No acute fracture or suspicious osseous lesion. Mild C1-2 arthropathy. Soft tissues and spinal canal: No prevertebral fluid or swelling. No visible canal hematoma. Disc levels: Disc degeneration most advanced at C3-4 where there is degenerative interbody ankylosis. Moderately advanced disc degeneration  at C6-7. Moderate cervical facet arthrosis. Moderate left neural foraminal stenosis at C5-6 and C6-7. Upper chest: Clear lung apices. Other: Moderate enlargement of the left greater than right thyroid lobes with multiple underlying nodules and with left-sided thyroid extension into the superior mediastinum, incompletely imaged. Associated rightward tracheal deviation without significant airway narrowing. Mild calcified atherosclerosis at the carotid bifurcations. IMPRESSION: 1. No evidence of acute intracranial abnormality. 2. Moderate to severe chronic small vessel ischemic disease. 3. No evidence of acute cervical spine fracture. 4. Multinodular thyroid goiter. Electronically Signed   By: Sebastian Ache M.D.   On: 12/14/2018 08:19   Ct Cervical Spine Wo Contrast  Result Date: 12/14/2018 CLINICAL DATA:  Found on ground this morning. EXAM: CT HEAD WITHOUT CONTRAST CT CERVICAL SPINE WITHOUT CONTRAST TECHNIQUE: Multidetector CT imaging of the head and cervical spine was performed following the standard protocol without intravenous contrast. Multiplanar CT image reconstructions of the cervical spine were also generated. COMPARISON:  None. FINDINGS: CT HEAD FINDINGS Brain: There is no evidence of acute infarct, intracranial hemorrhage, mass, midline shift, or extra-axial fluid collection. Small chronic infarcts are present in the bilateral basal ganglia, left cerebellum, and potentially thalami. Confluent cerebral white matter hypodensities are nonspecific but compatible with moderate to severe chronic small vessel ischemic disease. There is moderately advanced cerebral atrophy. Vascular: Calcified atherosclerosis at the skull base. No hyperdense vessel. Skull: No fracture or focal osseous lesion. Sinuses/Orbits: Visualized paranasal sinuses and mastoid air cells are clear. Visualized orbits are unremarkable. Other: None. CT CERVICAL SPINE FINDINGS Alignment: Mild reversal of the normal cervical lordosis. Minimal  anterolisthesis of C2 on C3, C4 on C5, and C7 on T1. Skull base and vertebrae: No acute fracture or suspicious osseous lesion. Mild C1-2 arthropathy. Soft tissues and spinal canal: No prevertebral fluid or swelling. No visible canal hematoma. Disc levels: Disc degeneration most advanced at C3-4 where there is degenerative interbody ankylosis. Moderately advanced disc degeneration at C6-7. Moderate cervical facet arthrosis. Moderate left neural foraminal stenosis at C5-6 and C6-7. Upper chest: Clear lung apices. Other: Moderate enlargement of the left greater than right thyroid lobes with multiple underlying nodules and with left-sided thyroid extension into the superior mediastinum, incompletely imaged. Associated rightward tracheal deviation without significant airway narrowing. Mild calcified atherosclerosis at the carotid bifurcations. IMPRESSION: 1. No evidence of acute intracranial abnormality. 2. Moderate to severe chronic small vessel ischemic disease. 3. No evidence of acute cervical spine fracture. 4. Multinodular thyroid goiter. Electronically Signed   By: Sebastian Ache M.D.   On: 12/14/2018 08:19   Dg Pelvis Portable  Result Date: 12/14/2018 CLINICAL DATA:  Unwitnessed fall.  Right hip deformity. EXAM: PORTABLE PELVIS 1-2 VIEWS COMPARISON:  None. FINDINGS: Severely angulated and displaced fracture is seen involving the intertrochanteric region of the proximal right femur. Left hip appears normal. IMPRESSION: Severely angulated and displaced intertrochanteric fracture of proximal right femur. Electronically Signed   By: Lupita Raider M.D.   On: 12/14/2018 08:15   Dg Chest Portable 1 View  Result Date: 12/14/2018 CLINICAL DATA:  Unwitnessed fall. EXAM: PORTABLE CHEST 1 VIEW COMPARISON:  None. FINDINGS: Mild cardiomegaly is noted. No pneumothorax or pleural effusion is noted. Mild bibasilar subsegmental atelectasis or infiltrates are noted. The visualized skeletal structures are unremarkable.  IMPRESSION: Mild bibasilar subsegmental atelectasis or infiltrates. Electronically Signed   By: Lupita Raider M.D.   On: 12/14/2018 08:14   Dg C-arm 1-60 Min  Result Date: 12/14/2018 CLINICAL DATA:  Right femur intertrochanteric fracture, operative fixation EXAM: OPERATIVE RIGHT HIP (WITH PELVIS IF PERFORMED) 4 VIEWS TECHNIQUE: Fluoroscopic spot image(s) were submitted for interpretation post-operatively. COMPARISON:  12/14/2018 FINDINGS: Spot fluoroscopic intraoperative views demonstrate screw and rod fixation of the right hip intertrochanteric fracture. Fracture lines remain visible. Improved alignment. No complicating feature or hardware abnormality. Peripheral atherosclerosis noted. Bones are osteopenic. IMPRESSION: Status post ORIF of the right hip intertrochanteric fracture with improved alignment. Electronically Signed   By: Judie Petit.  Shick M.D.   On: 12/14/2018 14:39   Dg Hip Operative Unilat With Pelvis Right  Result Date: 12/14/2018 CLINICAL DATA:  Right femur intertrochanteric fracture, operative fixation EXAM: OPERATIVE RIGHT HIP (WITH PELVIS IF PERFORMED) 4 VIEWS TECHNIQUE: Fluoroscopic spot image(s) were submitted for interpretation post-operatively. COMPARISON:  12/14/2018 FINDINGS: Spot fluoroscopic intraoperative views demonstrate screw and rod fixation of the right hip intertrochanteric fracture. Fracture lines remain visible. Improved alignment. No complicating feature or hardware abnormality. Peripheral atherosclerosis noted. Bones are osteopenic. IMPRESSION: Status post ORIF of the right hip intertrochanteric fracture with improved alignment. Electronically Signed   By: Judie Petit.  Shick M.D.   On: 12/14/2018 14:39   Dg Femur Min 2 Views Right  Result Date: 12/14/2018 CLINICAL DATA:  Femur fracture. EXAM: RIGHT FEMUR 2 VIEWS COMPARISON:  None. FINDINGS: Severely displaced and comminuted fracture is seen involving the intertrochanteric region of the proximal right femur. Vascular calcifications are  noted. Severe degenerative joint disease of the right knee is noted. IMPRESSION: Comminuted and severely displaced intertrochanteric fracture of proximal right femur is noted. Electronically Signed   By: Lupita Raider M.D.   On: 12/14/2018 08:21     Subjective: Without complaints this AM  Discharge Exam: Vitals:   12/17/18 0345 12/17/18 1041  BP: 123/70 120/67  Pulse: 73 83  Resp: 15   Temp: 99 F (37.2 C)   SpO2: 99%    Vitals:   12/16/18 1230 12/16/18 2042 12/17/18 0345 12/17/18 1041  BP: 111/86 126/69 123/70 120/67  Pulse: 69 68 73 83  Resp: Temp: 100 F (37.8 C) 99.5 F (37.5 C) 99 F (37.2 C)   TempSrc: Oral Oral Oral   SpO2: 99% 99% 99%   Weight:      Height:        General: Pt is alert, awake, not in acute distress Cardiovascular: RRR, S1/S2 +, no rubs, no gallops Respiratory: CTA bilaterally, no wheezing, no rhonchi Abdominal: Soft, NT, ND, bowel sounds + Extremities: no edema, no cyanosis   The results of significant diagnostics from this hospitalization (including imaging, microbiology, ancillary and laboratory) are listed below for reference.     Microbiology: Recent Results (from the past 240 hour(s))  SARS Coronavirus 2 (CEPHEID - Performed in [facility] hospital lab), Hosp Order     Status: None   Collection Time: 12/14/18  7:32 AM  Result Value Ref Range Status   SARS Coronavirus 2 NEGATIVE NEGATIVE Final    Comment: (NOTE) If result is NEGATIVE SARS-CoV-2 target nucleic acids are NOT DETECTED. The  SARS-CoV-2 RNA is generally detectable in upper and lower  respiratory specimens during the acute phase of infection. The lowest  concentration of SARS-CoV-2 viral copies this assay can detect is 250  copies / mL. A negative result does not preclude SARS-CoV-2 infection  and should not be used as the sole basis for treatment or other  patient management decisions.  A negative result may occur with  improper specimen collection /  handling, submission of specimen other  than nasopharyngeal swab, presence of viral mutation(s) within the  areas targeted by this assay, and inadequate number of viral copies  (<250 copies / mL). A negative result must be combined with clinical  observations, patient history, and epidemiological information. If result is POSITIVE SARS-CoV-2 target nucleic acids are DETECTED. The SARS-CoV-2 RNA is generally detectable in upper and lower  respiratory specimens dur ing the acute phase of infection.  Positive  results are indicative of active infection with SARS-CoV-2.  Clinical  correlation with patient history and other diagnostic information is  necessary to determine patient infection status.  Positive results do  not rule out bacterial infection or co-infection with other viruses. If result is PRESUMPTIVE POSTIVE SARS-CoV-2 nucleic acids MAY BE PRESENT.   A presumptive positive result was obtained on the submitted specimen  and confirmed on repeat testing.  While 2019 novel coronavirus  (SARS-CoV-2) nucleic acids may be present in the submitted sample  additional confirmatory testing may be necessary for epidemiological  and / or clinical management purposes  to differentiate between  SARS-CoV-2 and other Sarbecovirus currently known to infect humans.  If clinically indicated additional testing with an alternate test  methodology [phone] is advised. The SARS-CoV-2 RNA is generally  detectable in upper and lower respiratory sp ecimens during the acute  phase of infection. The expected result is Negative. Fact Sheet for Patients:  BoilerBrush.com.cy Fact Sheet for Healthcare Providers: https://pope.com/ This test is not yet approved or cleared by the [place] FDA and has been authorized for detection and/or diagnosis of SARS-CoV-2 by FDA under an Emergency Use Authorization (EUA).  This EUA will remain in effect (meaning this  test can be used) for the duration of the COVID-19 declaration under Section 564(b)(1) of the Act, 21 U.S.C. section 360bbb-3(b)(1), unless the authorization is terminated or revoked sooner. Performed at [facility], [address], [place], [place] [address]   Surgical pcr screen     Status: Abnormal   Collection Time: 12/14/18 10:31 AM  Result Value Ref Range Status   MRSA, PCR NEGATIVE NEGATIVE Final   Staphylococcus aureus POSITIVE (A) NEGATIVE Final    Comment: (NOTE) The Xpert SA Assay (FDA approved for NASAL specimens in patients 33 years of age and older), is one component of a comprehensive surveillance program. It is not intended to diagnose infection nor to guide or monitor treatment. Performed at [facility], [address], [place], [place] [address]      Labs: BNP (last 3 results) No results for input(s): BNP in the last 8760 hours. Basic Metabolic Panel: Recent Labs  Lab 12/14/18 0719 12/14/18 1047 12/15/18 0232 12/16/18 0335  NA 139 140 137 138  K 5.6* 3.5 3.7 3.4*  CL 105 105 102 105  CO2 GLUCOSE 197* 113* 121* 142*  BUN 26*  CREATININE 1.05* 0.85 1.14* 1.03*  CALCIUM 8.7* 8.5* 8.2* 7.8*   Liver Function Tests: No results for input(s): AST, ALT, ALKPHOS, BILITOT, PROT,  ALBUMIN in the last 168 hours. No results for input(s): LIPASE, AMYLASE in the last 168 hours. No results for input(s): AMMONIA in the last 168 hours. CBC: Recent Labs  Lab 12/14/18 0719 12/15/18 0232 12/16/18 0335  WBC 11.1* 8.4 8.1  NEUTROABS 9.2*  --   --   HGB 11.9* 8.6* 8.0*  HCT 37.0 26.6* 25.0*  MCV 91.4 90.5 90.9  PLT 167 112* 96*   Cardiac Enzymes: No results for input(s): CKTOTAL, CKMB, CKMBINDEX, TROPONINI in the last 168 hours. BNP: Invalid input(s): POCBNP CBG: No results for input(s): GLUCAP in the last 168 hours. D-Dimer No results for input(s): DDIMER in the last 72 hours. Hgb A1c No results for input(s): HGBA1C in  the last 72 hours. Lipid Profile No results for input(s): CHOL, HDL, LDLCALC, TRIG, CHOLHDL, LDLDIRECT in the last 72 hours. Thyroid function studies No results for input(s): TSH, T4TOTAL, T3FREE, THYROIDAB in the last 72 hours.  Invalid input(s): FREET3 Anemia work up No results for input(s): VITAMINB12, FOLATE, FERRITIN, TIBC, IRON, RETICCTPCT in the last 72 hours. Urinalysis    Component Value Date/Time   COLORURINE YELLOW 12/14/2018 0858   APPEARANCEUR CLEAR 12/14/2018 0858   LABSPEC 1.014 12/14/2018 0858   PHURINE 6.0 12/14/2018 0858   GLUCOSEU NEGATIVE 12/14/2018 0858   HGBUR NEGATIVE 12/14/2018 0858   BILIRUBINUR NEGATIVE 12/14/2018 0858   KETONESUR NEGATIVE 12/14/2018 0858   PROTEINUR NEGATIVE 12/14/2018 0858   NITRITE NEGATIVE 12/14/2018 0858   LEUKOCYTESUR NEGATIVE 12/14/2018 0858   Sepsis Labs Invalid input(s): PROCALCITONIN,  WBC,  LACTICIDVEN Microbiology Recent Results (from the past 240 hour(s))  SARS Coronavirus 2 (CEPHEID - Performed in [facility] hospital lab), Hosp Order     Status: None   Collection Time: 12/14/18  7:32 AM  Result Value Ref Range Status   SARS Coronavirus 2 NEGATIVE NEGATIVE Final    Comment: (NOTE) If result is NEGATIVE SARS-CoV-2 target nucleic acids are NOT DETECTED. The SARS-CoV-2 RNA is generally detectable in upper and lower  respiratory specimens during the acute phase of infection. The lowest  concentration of SARS-CoV-2 viral copies this assay can detect is 250  copies / mL. A negative result does not preclude SARS-CoV-2 infection  and should not be used as the sole basis for treatment or other  patient management decisions.  A negative result may occur with  improper specimen collection / handling, submission of specimen other  than nasopharyngeal swab, presence of viral mutation(s) within the  areas targeted by this assay, and inadequate number of viral copies  (<250 copies / mL). A negative result must be combined with  clinical  observations, patient history, and epidemiological information. If result is POSITIVE SARS-CoV-2 target nucleic acids are DETECTED. The SARS-CoV-2 RNA is generally detectable in upper and lower  respiratory specimens dur ing the acute phase of infection.  Positive  results are indicative of active infection with SARS-CoV-2.  Clinical  correlation with patient history and other diagnostic information is  necessary to determine patient infection status.  Positive results do  not rule out bacterial infection or co-infection with other viruses. If result is PRESUMPTIVE POSTIVE SARS-CoV-2 nucleic acids MAY BE PRESENT.   A presumptive positive result was obtained on the submitted specimen  and confirmed on repeat testing.  While 2019 novel coronavirus  (SARS-CoV-2) nucleic acids may be present in the submitted sample  additional confirmatory testing may be necessary for epidemiological  and / or clinical management purposes  to differentiate between  SARS-CoV-2 and other  Sarbecovirus currently known to infect humans.  If clinically indicated additional testing with an alternate test  methodology [phone]) is advised. The SARS-CoV-2 RNA is generally  detectable in upper and lower respiratory sp ecimens during the acute  phase of infection. The expected result is Negative. Fact Sheet for Patients:  BoilerBrush.com.cyhttps://www.fda.gov/media/136312/download Fact Sheet for Healthcare Providers: https://pope.com/https://www.fda.gov/media/136313/download This test is not yet approved or cleared by the [place] FDA and has been authorized for detection and/or diagnosis of SARS-CoV-2 by FDA under an Emergency Use Authorization (EUA).  This EUA will remain in effect (meaning this test can be used) for the duration of the COVID-19 declaration under Section 564(b)(1) of the Act, 21 U.S.C. section 360bbb-3(b)(1), unless the authorization is terminated or revoked sooner. Performed at [facility], [address] [place], [place] [address]   Surgical pcr screen     Status: Abnormal   Collection Time: 12/14/18 10:31 AM  Result Value Ref Range Status   MRSA, PCR NEGATIVE NEGATIVE Final   Staphylococcus aureus POSITIVE (A) NEGATIVE Final    Comment: (NOTE) The Xpert SA Assay (FDA approved for NASAL specimens in patients 83 years of age and older), is one component of a comprehensive surveillance program. It is not intended to diagnose infection nor to guide or monitor treatment. Performed at [facility], [address] [place], [place] [address]    Time spent: 30min  SIGNED:   Rickey BarbaraStephen Ambrose Wile, MD  [facility] 12/17/2018, 1:16 PM  If 7PM-7AM, please contact night-coverage

## 2018-12-18 DIAGNOSIS — Z7982 Long term (current) use of aspirin: Secondary | ICD-10-CM | POA: Diagnosis not present

## 2018-12-18 DIAGNOSIS — Z602 Problems related to living alone: Secondary | ICD-10-CM | POA: Diagnosis not present

## 2018-12-18 DIAGNOSIS — S72141D Displaced intertrochanteric fracture of right femur, subsequent encounter for closed fracture with routine healing: Secondary | ICD-10-CM | POA: Diagnosis not present

## 2018-12-18 DIAGNOSIS — W19XXXD Unspecified fall, subsequent encounter: Secondary | ICD-10-CM | POA: Diagnosis not present

## 2018-12-18 DIAGNOSIS — F0390 Unspecified dementia without behavioral disturbance: Secondary | ICD-10-CM | POA: Diagnosis not present

## 2018-12-18 DIAGNOSIS — D62 Acute posthemorrhagic anemia: Secondary | ICD-10-CM | POA: Diagnosis not present

## 2018-12-18 DIAGNOSIS — M5031 Other cervical disc degeneration,  high cervical region: Secondary | ICD-10-CM | POA: Diagnosis not present

## 2018-12-18 DIAGNOSIS — I480 Paroxysmal atrial fibrillation: Secondary | ICD-10-CM | POA: Diagnosis not present

## 2018-12-18 DIAGNOSIS — F410 Panic disorder [episodic paroxysmal anxiety] without agoraphobia: Secondary | ICD-10-CM | POA: Diagnosis not present

## 2018-12-18 DIAGNOSIS — R739 Hyperglycemia, unspecified: Secondary | ICD-10-CM | POA: Diagnosis not present

## 2018-12-18 DIAGNOSIS — Z742 Need for assistance at home and no other household member able to render care: Secondary | ICD-10-CM | POA: Diagnosis not present

## 2018-12-18 DIAGNOSIS — M810 Age-related osteoporosis without current pathological fracture: Secondary | ICD-10-CM | POA: Diagnosis not present

## 2018-12-18 DIAGNOSIS — E43 Unspecified severe protein-calorie malnutrition: Secondary | ICD-10-CM | POA: Diagnosis not present

## 2018-12-18 DIAGNOSIS — I119 Hypertensive heart disease without heart failure: Secondary | ICD-10-CM | POA: Diagnosis not present

## 2018-12-18 DIAGNOSIS — Z9181 History of falling: Secondary | ICD-10-CM | POA: Diagnosis not present

## 2018-12-18 DIAGNOSIS — I509 Heart failure, unspecified: Secondary | ICD-10-CM | POA: Diagnosis not present

## 2018-12-18 DIAGNOSIS — I110 Hypertensive heart disease with heart failure: Secondary | ICD-10-CM | POA: Diagnosis not present

## 2018-12-19 DIAGNOSIS — I480 Paroxysmal atrial fibrillation: Secondary | ICD-10-CM | POA: Diagnosis not present

## 2018-12-19 DIAGNOSIS — E43 Unspecified severe protein-calorie malnutrition: Secondary | ICD-10-CM | POA: Diagnosis not present

## 2018-12-19 DIAGNOSIS — I119 Hypertensive heart disease without heart failure: Secondary | ICD-10-CM | POA: Diagnosis not present

## 2018-12-19 DIAGNOSIS — R739 Hyperglycemia, unspecified: Secondary | ICD-10-CM | POA: Diagnosis not present

## 2018-12-19 DIAGNOSIS — D62 Acute posthemorrhagic anemia: Secondary | ICD-10-CM | POA: Diagnosis not present

## 2018-12-19 DIAGNOSIS — S72141D Displaced intertrochanteric fracture of right femur, subsequent encounter for closed fracture with routine healing: Secondary | ICD-10-CM | POA: Diagnosis not present

## 2018-12-20 DIAGNOSIS — R739 Hyperglycemia, unspecified: Secondary | ICD-10-CM | POA: Diagnosis not present

## 2018-12-20 DIAGNOSIS — D62 Acute posthemorrhagic anemia: Secondary | ICD-10-CM | POA: Diagnosis not present

## 2018-12-20 DIAGNOSIS — I480 Paroxysmal atrial fibrillation: Secondary | ICD-10-CM | POA: Diagnosis not present

## 2018-12-20 DIAGNOSIS — S72141D Displaced intertrochanteric fracture of right femur, subsequent encounter for closed fracture with routine healing: Secondary | ICD-10-CM | POA: Diagnosis not present

## 2018-12-20 DIAGNOSIS — I119 Hypertensive heart disease without heart failure: Secondary | ICD-10-CM | POA: Diagnosis not present

## 2018-12-20 DIAGNOSIS — E43 Unspecified severe protein-calorie malnutrition: Secondary | ICD-10-CM | POA: Diagnosis not present

## 2018-12-23 DIAGNOSIS — I480 Paroxysmal atrial fibrillation: Secondary | ICD-10-CM | POA: Diagnosis not present

## 2018-12-23 DIAGNOSIS — E43 Unspecified severe protein-calorie malnutrition: Secondary | ICD-10-CM | POA: Diagnosis not present

## 2018-12-23 DIAGNOSIS — D62 Acute posthemorrhagic anemia: Secondary | ICD-10-CM | POA: Diagnosis not present

## 2018-12-23 DIAGNOSIS — R739 Hyperglycemia, unspecified: Secondary | ICD-10-CM | POA: Diagnosis not present

## 2018-12-23 DIAGNOSIS — I119 Hypertensive heart disease without heart failure: Secondary | ICD-10-CM | POA: Diagnosis not present

## 2018-12-23 DIAGNOSIS — S72141D Displaced intertrochanteric fracture of right femur, subsequent encounter for closed fracture with routine healing: Secondary | ICD-10-CM | POA: Diagnosis not present

## 2018-12-24 ENCOUNTER — Telehealth: Payer: Self-pay | Admitting: *Deleted

## 2018-12-24 DIAGNOSIS — D62 Acute posthemorrhagic anemia: Secondary | ICD-10-CM | POA: Diagnosis not present

## 2018-12-24 DIAGNOSIS — R739 Hyperglycemia, unspecified: Secondary | ICD-10-CM | POA: Diagnosis not present

## 2018-12-24 DIAGNOSIS — I480 Paroxysmal atrial fibrillation: Secondary | ICD-10-CM | POA: Diagnosis not present

## 2018-12-24 DIAGNOSIS — I119 Hypertensive heart disease without heart failure: Secondary | ICD-10-CM | POA: Diagnosis not present

## 2018-12-24 DIAGNOSIS — S72141D Displaced intertrochanteric fracture of right femur, subsequent encounter for closed fracture with routine healing: Secondary | ICD-10-CM | POA: Diagnosis not present

## 2018-12-24 DIAGNOSIS — E43 Unspecified severe protein-calorie malnutrition: Secondary | ICD-10-CM | POA: Diagnosis not present

## 2018-12-25 DIAGNOSIS — S72141D Displaced intertrochanteric fracture of right femur, subsequent encounter for closed fracture with routine healing: Secondary | ICD-10-CM | POA: Diagnosis not present

## 2018-12-25 DIAGNOSIS — E43 Unspecified severe protein-calorie malnutrition: Secondary | ICD-10-CM | POA: Diagnosis not present

## 2018-12-25 DIAGNOSIS — R739 Hyperglycemia, unspecified: Secondary | ICD-10-CM | POA: Diagnosis not present

## 2018-12-25 DIAGNOSIS — D62 Acute posthemorrhagic anemia: Secondary | ICD-10-CM | POA: Diagnosis not present

## 2018-12-25 DIAGNOSIS — I119 Hypertensive heart disease without heart failure: Secondary | ICD-10-CM | POA: Diagnosis not present

## 2018-12-25 DIAGNOSIS — I480 Paroxysmal atrial fibrillation: Secondary | ICD-10-CM | POA: Diagnosis not present

## 2018-12-26 DIAGNOSIS — S72141D Displaced intertrochanteric fracture of right femur, subsequent encounter for closed fracture with routine healing: Secondary | ICD-10-CM | POA: Diagnosis not present

## 2018-12-26 DIAGNOSIS — D62 Acute posthemorrhagic anemia: Secondary | ICD-10-CM | POA: Diagnosis not present

## 2018-12-26 DIAGNOSIS — I480 Paroxysmal atrial fibrillation: Secondary | ICD-10-CM | POA: Diagnosis not present

## 2018-12-26 DIAGNOSIS — E43 Unspecified severe protein-calorie malnutrition: Secondary | ICD-10-CM | POA: Diagnosis not present

## 2018-12-26 DIAGNOSIS — R739 Hyperglycemia, unspecified: Secondary | ICD-10-CM | POA: Diagnosis not present

## 2018-12-26 DIAGNOSIS — I119 Hypertensive heart disease without heart failure: Secondary | ICD-10-CM | POA: Diagnosis not present

## 2018-12-31 DIAGNOSIS — D62 Acute posthemorrhagic anemia: Secondary | ICD-10-CM | POA: Diagnosis not present

## 2018-12-31 DIAGNOSIS — E43 Unspecified severe protein-calorie malnutrition: Secondary | ICD-10-CM | POA: Diagnosis not present

## 2018-12-31 DIAGNOSIS — I480 Paroxysmal atrial fibrillation: Secondary | ICD-10-CM | POA: Diagnosis not present

## 2018-12-31 DIAGNOSIS — R739 Hyperglycemia, unspecified: Secondary | ICD-10-CM | POA: Diagnosis not present

## 2018-12-31 DIAGNOSIS — S72141D Displaced intertrochanteric fracture of right femur, subsequent encounter for closed fracture with routine healing: Secondary | ICD-10-CM | POA: Diagnosis not present

## 2018-12-31 DIAGNOSIS — I119 Hypertensive heart disease without heart failure: Secondary | ICD-10-CM | POA: Diagnosis not present

## 2019-01-01 DIAGNOSIS — S72141D Displaced intertrochanteric fracture of right femur, subsequent encounter for closed fracture with routine healing: Secondary | ICD-10-CM | POA: Diagnosis not present

## 2019-01-01 DIAGNOSIS — R739 Hyperglycemia, unspecified: Secondary | ICD-10-CM | POA: Diagnosis not present

## 2019-01-01 DIAGNOSIS — E43 Unspecified severe protein-calorie malnutrition: Secondary | ICD-10-CM | POA: Diagnosis not present

## 2019-01-01 DIAGNOSIS — I119 Hypertensive heart disease without heart failure: Secondary | ICD-10-CM | POA: Diagnosis not present

## 2019-01-01 DIAGNOSIS — I480 Paroxysmal atrial fibrillation: Secondary | ICD-10-CM | POA: Diagnosis not present

## 2019-01-01 DIAGNOSIS — D62 Acute posthemorrhagic anemia: Secondary | ICD-10-CM | POA: Diagnosis not present

## 2019-01-02 ENCOUNTER — Inpatient Hospital Stay (HOSPITAL_COMMUNITY)
Admission: EM | Admit: 2019-01-02 | Discharge: 2019-01-04 | DRG: 292 | Disposition: A | Discharge status: Home Health | Payer: Medicare Other | Attending: Pulmonary Disease | Admitting: Pulmonary Disease

## 2019-01-02 ENCOUNTER — Emergency Department (HOSPITAL_COMMUNITY): Payer: Medicare Other

## 2019-01-02 ENCOUNTER — Observation Stay (HOSPITAL_BASED_OUTPATIENT_CLINIC_OR_DEPARTMENT_OTHER): Payer: Medicare Other

## 2019-01-02 ENCOUNTER — Other Ambulatory Visit: Payer: Self-pay

## 2019-01-02 ENCOUNTER — Encounter (HOSPITAL_COMMUNITY): Payer: Self-pay | Admitting: Emergency Medicine

## 2019-01-02 ENCOUNTER — Observation Stay (HOSPITAL_COMMUNITY): Payer: Medicare Other

## 2019-01-02 DIAGNOSIS — I10 Essential (primary) hypertension: Secondary | ICD-10-CM | POA: Diagnosis present

## 2019-01-02 DIAGNOSIS — Z8249 Family history of ischemic heart disease and other diseases of the circulatory system: Secondary | ICD-10-CM

## 2019-01-02 DIAGNOSIS — I5033 Acute on chronic diastolic (congestive) heart failure: Secondary | ICD-10-CM | POA: Diagnosis present

## 2019-01-02 DIAGNOSIS — I509 Heart failure, unspecified: Secondary | ICD-10-CM

## 2019-01-02 DIAGNOSIS — Z886 Allergy status to analgesic agent status: Secondary | ICD-10-CM

## 2019-01-02 DIAGNOSIS — Z7982 Long term (current) use of aspirin: Secondary | ICD-10-CM

## 2019-01-02 DIAGNOSIS — F0390 Unspecified dementia without behavioral disturbance: Secondary | ICD-10-CM

## 2019-01-02 DIAGNOSIS — R069 Unspecified abnormalities of breathing: Secondary | ICD-10-CM | POA: Diagnosis not present

## 2019-01-02 DIAGNOSIS — I110 Hypertensive heart disease with heart failure: Principal | ICD-10-CM | POA: Diagnosis present

## 2019-01-02 DIAGNOSIS — R0602 Shortness of breath: Secondary | ICD-10-CM

## 2019-01-02 DIAGNOSIS — F0391 Unspecified dementia with behavioral disturbance: Secondary | ICD-10-CM | POA: Diagnosis not present

## 2019-01-02 DIAGNOSIS — I480 Paroxysmal atrial fibrillation: Secondary | ICD-10-CM | POA: Diagnosis present

## 2019-01-02 DIAGNOSIS — I4819 Other persistent atrial fibrillation: Secondary | ICD-10-CM | POA: Diagnosis not present

## 2019-01-02 DIAGNOSIS — M4855XD Collapsed vertebra, not elsewhere classified, thoracolumbar region, subsequent encounter for fracture with routine healing: Secondary | ICD-10-CM | POA: Diagnosis present

## 2019-01-02 DIAGNOSIS — Z79899 Other long term (current) drug therapy: Secondary | ICD-10-CM

## 2019-01-02 DIAGNOSIS — Z20828 Contact with and (suspected) exposure to other viral communicable diseases: Secondary | ICD-10-CM | POA: Diagnosis present

## 2019-01-02 DIAGNOSIS — I4891 Unspecified atrial fibrillation: Secondary | ICD-10-CM | POA: Diagnosis not present

## 2019-01-02 DIAGNOSIS — I361 Nonrheumatic tricuspid (valve) insufficiency: Secondary | ICD-10-CM | POA: Diagnosis not present

## 2019-01-02 DIAGNOSIS — Z882 Allergy status to sulfonamides status: Secondary | ICD-10-CM

## 2019-01-02 DIAGNOSIS — Z823 Family history of stroke: Secondary | ICD-10-CM

## 2019-01-02 DIAGNOSIS — I340 Nonrheumatic mitral (valve) insufficiency: Secondary | ICD-10-CM

## 2019-01-02 DIAGNOSIS — R000 Tachycardia, unspecified: Secondary | ICD-10-CM | POA: Diagnosis present

## 2019-01-02 DIAGNOSIS — Z880 Allergy status to penicillin: Secondary | ICD-10-CM

## 2019-01-02 DIAGNOSIS — M4854XD Collapsed vertebra, not elsewhere classified, thoracic region, subsequent encounter for fracture with routine healing: Secondary | ICD-10-CM | POA: Diagnosis present

## 2019-01-02 DIAGNOSIS — J90 Pleural effusion, not elsewhere classified: Secondary | ICD-10-CM | POA: Diagnosis not present

## 2019-01-02 LAB — BASIC METABOLIC PANEL
Anion gap: 12 (ref 5–15)
BUN: 25 mg/dL — ABNORMAL HIGH (ref 8–23)
CO2: 24 mmol/L (ref 22–32)
Calcium: 8.7 mg/dL — ABNORMAL LOW (ref 8.9–10.3)
Chloride: 105 mmol/L (ref 98–111)
Creatinine, Ser: 0.87 mg/dL (ref 0.44–1.00)
GFR calc Af Amer: >60 mL/min (ref >60)
GFR calc non Af Amer: >60 mL/min (ref >60)
Glucose, Bld: 108 mg/dL — ABNORMAL HIGH (ref 70–99)
Potassium: 3.9 mmol/L (ref 3.5–5.1)
Sodium: 141 mmol/L (ref 135–145)

## 2019-01-02 LAB — URINALYSIS, ROUTINE W REFLEX MICROSCOPIC
Bilirubin Urine: NEGATIVE
Glucose, UA: NEGATIVE mg/dL
Hgb urine dipstick: NEGATIVE
Ketones, ur: NEGATIVE mg/dL
Leukocytes,Ua: NEGATIVE
Nitrite: NEGATIVE
Protein, ur: NEGATIVE mg/dL
Specific Gravity, Urine: 1.011 (ref 1.005–1.030)
pH: 7 (ref 5.0–8.0)

## 2019-01-02 LAB — CBC WITH DIFFERENTIAL/PLATELET
Abs Immature Granulocytes: 0.05 10*3/uL (ref 0.00–0.07)
Basophils Absolute: 0.1 10*3/uL (ref 0.0–0.1)
Basophils Relative: 1 %
Eosinophils Absolute: 0.3 10*3/uL (ref 0.0–0.5)
Eosinophils Relative: 3 %
HCT: 30 % — ABNORMAL LOW (ref 36.0–46.0)
Hemoglobin: 9.2 g/dL — ABNORMAL LOW (ref 12.0–15.0)
Immature Granulocytes: 1 %
Lymphocytes Relative: 7 %
Lymphs Abs: 0.5 10*3/uL — ABNORMAL LOW (ref 0.7–4.0)
MCH: 29.8 pg (ref 26.0–34.0)
MCHC: 30.7 g/dL (ref 30.0–36.0)
MCV: 97.1 fL (ref 80.0–100.0)
Monocytes Absolute: 0.6 10*3/uL (ref 0.1–1.0)
Monocytes Relative: 9 %
Neutro Abs: 6 10*3/uL (ref 1.7–7.7)
Neutrophils Relative %: 79 %
Platelets: 256 10*3/uL (ref 150–400)
RBC: 3.09 MIL/uL — ABNORMAL LOW (ref 3.87–5.11)
RDW: 18.6 % — ABNORMAL HIGH (ref 11.5–15.5)
WBC: 7.5 10*3/uL (ref 4.0–10.5)
nRBC: 0.3 % — ABNORMAL HIGH (ref 0.0–0.2)

## 2019-01-02 LAB — TROPONIN I: Troponin I: 0.14 ng/mL (ref <0.03)

## 2019-01-02 LAB — BRAIN NATRIURETIC PEPTIDE: B Natriuretic Peptide: 257 pg/mL — ABNORMAL HIGH (ref 0.0–100.0)

## 2019-01-02 LAB — SARS CORONAVIRUS 2 BY RT PCR (HOSPITAL ORDER, PERFORMED IN ~~LOC~~ HOSPITAL LAB): SARS Coronavirus 2: NEGATIVE

## 2019-01-02 LAB — D-DIMER, QUANTITATIVE: D-Dimer, Quant: 5.01 ug/mL-FEU — ABNORMAL HIGH (ref 0.00–0.50)

## 2019-01-02 MED ORDER — ONDANSETRON HCL 4 MG/2ML IJ SOLN: 4.0000 mg | Freq: Four times a day (QID) | INTRAMUSCULAR | Status: DC | PRN

## 2019-01-02 MED ORDER — TRAZODONE HCL 50 MG PO TABS: 50.0000 mg | ORAL_TABLET | Freq: Every evening | ORAL | Status: DC | PRN

## 2019-01-02 MED ORDER — ASPIRIN 81 MG PO CHEW
81.0000 mg | CHEWABLE_TABLET | Freq: Every day | ORAL | Status: DC
  Administered 2019-01-02 – 2019-01-04 (×3): 81 mg via ORAL
  Filled 2019-01-02 (×3): qty 1

## 2019-01-02 MED ORDER — FUROSEMIDE 10 MG/ML IJ SOLN
20.0000 mg | Freq: Two times a day (BID) | INTRAMUSCULAR | Status: DC
  Administered 2019-01-03: 07:00:00 20 mg via INTRAVENOUS
  Filled 2019-01-02: qty 2

## 2019-01-02 MED ORDER — DILTIAZEM HCL ER COATED BEADS 180 MG PO CP24
300.0000 mg | ORAL_CAPSULE | Freq: Every day | ORAL | Status: DC
  Administered 2019-01-02: 300 mg via ORAL
  Filled 2019-01-02 (×2): qty 1

## 2019-01-02 MED ORDER — IOHEXOL 350 MG/ML SOLN
100.0000 mL | Freq: Once | INTRAVENOUS | Status: AC | PRN
  Administered 2019-01-02: 17:00:00 75 mL via INTRAVENOUS

## 2019-01-02 MED ORDER — METOPROLOL TARTRATE 25 MG PO TABS
25.0000 mg | ORAL_TABLET | Freq: Two times a day (BID) | ORAL | Status: DC
  Administered 2019-01-03 – 2019-01-04 (×4): 25 mg via ORAL
  Filled 2019-01-02 (×5): qty 1

## 2019-01-02 MED ORDER — LORAZEPAM 2 MG/ML IJ SOLN
0.5000 mg | Freq: Four times a day (QID) | INTRAMUSCULAR | Status: DC | PRN
  Administered 2019-01-02: 16:00:00 0.5 mg via INTRAVENOUS
  Filled 2019-01-02: qty 1

## 2019-01-02 MED ORDER — ASPIRIN 81 MG PO CHEW
324.0000 mg | CHEWABLE_TABLET | Freq: Once | ORAL | Status: AC
  Administered 2019-01-02: 324 mg via ORAL
  Filled 2019-01-02: qty 4

## 2019-01-02 MED ORDER — ALBUTEROL SULFATE (2.5 MG/3ML) 0.083% IN NEBU: 2.5000 mg | INHALATION_SOLUTION | RESPIRATORY_TRACT | Status: DC | PRN

## 2019-01-02 MED ORDER — TRAMADOL HCL 50 MG PO TABS
50.0000 mg | ORAL_TABLET | Freq: Two times a day (BID) | ORAL | Status: DC | PRN
  Administered 2019-01-03: 13:00:00 50 mg via ORAL
  Filled 2019-01-02: qty 1

## 2019-01-02 MED ORDER — DILTIAZEM HCL 30 MG PO TABS: 30.0000 mg | ORAL_TABLET | Freq: Four times a day (QID) | ORAL | Status: DC

## 2019-01-02 MED ORDER — HEPARIN SODIUM (PORCINE) 5000 UNIT/ML IJ SOLN
5000.0000 [IU] | Freq: Three times a day (TID) | INTRAMUSCULAR | Status: DC
  Administered 2019-01-03 (×4): 5000 [IU] via SUBCUTANEOUS
  Filled 2019-01-02 (×4): qty 1

## 2019-01-02 MED ORDER — ONDANSETRON HCL 4 MG PO TABS: 4.0000 mg | ORAL_TABLET | Freq: Four times a day (QID) | ORAL | Status: DC | PRN

## 2019-01-02 MED ORDER — NITROGLYCERIN 2 % TD OINT
0.5000 [in_us] | TOPICAL_OINTMENT | Freq: Once | TRANSDERMAL | Status: AC
  Administered 2019-01-02: 0.5 [in_us] via TOPICAL
  Filled 2019-01-02: qty 1

## 2019-01-02 MED ORDER — ACETAMINOPHEN 325 MG PO TABS: 650.0000 mg | ORAL_TABLET | Freq: Four times a day (QID) | ORAL | Status: DC | PRN

## 2019-01-02 MED ORDER — SODIUM CHLORIDE 0.9% FLUSH
3.0000 mL | Freq: Two times a day (BID) | INTRAVENOUS | Status: DC
  Administered 2019-01-03 – 2019-01-04 (×3): 3 mL via INTRAVENOUS

## 2019-01-02 MED ORDER — LABETALOL HCL 5 MG/ML IV SOLN: 10.0000 mg | INTRAVENOUS | Status: DC | PRN

## 2019-01-02 MED ORDER — SODIUM CHLORIDE 0.9% FLUSH: 3.0000 mL | INTRAVENOUS | Status: DC | PRN

## 2019-01-02 MED ORDER — METHOCARBAMOL 500 MG PO TABS
500.0000 mg | ORAL_TABLET | Freq: Two times a day (BID) | ORAL | Status: DC
  Administered 2019-01-03 – 2019-01-04 (×4): 500 mg via ORAL
  Filled 2019-01-02 (×4): qty 1

## 2019-01-02 MED ORDER — ACETAMINOPHEN 650 MG RE SUPP: 650.0000 mg | Freq: Four times a day (QID) | RECTAL | Status: DC | PRN

## 2019-01-02 MED ORDER — SODIUM CHLORIDE 0.9 % IV SOLN: 250.0000 mL | INTRAVENOUS | Status: DC | PRN

## 2019-01-02 MED ORDER — METOPROLOL TARTRATE 25 MG PO TABS
25.0000 mg | ORAL_TABLET | Freq: Once | ORAL | Status: AC
  Administered 2019-01-02: 11:00:00 25 mg via ORAL
  Filled 2019-01-02: qty 1

## 2019-01-02 MED ORDER — POLYETHYLENE GLYCOL 3350 17 G PO PACK: 17.0000 g | PACK | Freq: Every day | ORAL | Status: DC | PRN

## 2019-01-02 MED ORDER — FUROSEMIDE 10 MG/ML IJ SOLN
60.0000 mg | Freq: Once | INTRAMUSCULAR | Status: AC
  Administered 2019-01-02: 60 mg via INTRAVENOUS
  Filled 2019-01-02: qty 6

## 2019-01-02 NOTE — ED Notes (Signed)
CRITICAL VALUE ALERT  Critical Value:  Troponin 0.14  Date & Time Notied:  01/02/2019, 1142  Provider Notified: J. Idol PA  Orders Received/Actions taken: see chart

## 2019-01-02 NOTE — ED Notes (Signed)
ED TO INPATIENT HANDOFF REPORT  ED Nurse Name and Phone #: Jeannene Patellalynnette ,rn [phone]  S Name/Age/Gender Amy Randall 85 y.o. female Room/Bed: APA16A/APA16A  Code Status   Code Status: Prior  Home/SNF/Other Home Patient oriented to: self, place and time Is this baseline? Yes   Triage Complete: Triage complete  Chief Complaint Hypertension  Triage Note Pt from home. Daughter takes care of her.  C/o of "shaking" pt in AFIB, hypertensive.  Denies any pain.  Pt recently stopped taking her potassium for a recent surgery on her hip   Allergies Allergies  Allergen Reactions  . Bactrim [Sulfamethoxazole-Trimethoprim] Other (See Comments)    Loss of memory  . Ibuprofen Other (See Comments)    Causes bruising  . Orange Fruit [Citrus]     Per patient's daughter, orange snesitivity  . Penicillins Rash    Has patient had a PCN reaction causing immediate rash, facial/tongue/throat swelling, SOB or lightheadedness with hypotension: Yes Has patient had a PCN reaction causing severe rash involving mucus membranes or skin necrosis: No Has patient had a PCN reaction that required hospitalization: No Has patient had a PCN reaction occurring within the last 10 years: No If all of the above answers are "NO", then may proceed with Cephalosporin use.     Level of Care/Admitting Diagnosis ED Disposition    ED Disposition Condition Comment   Admit  Hospital Area: [facility] [[id number]]  Level of Care: Telemetry [5]  Covid Evaluation: N/A  Diagnosis: Tachycardia [[id number]][[id number]]  Admitting Physician: Marylyn IshiharaEMOKPAE, COURAGE [[id number]]  Attending Physician: Marylyn IshiharaEMOKPAE, COURAGE [[id number]]  Bed request comments: Tele  PT Class (Do Not Modify): Observation [104]  PT Acc Code (Do Not Modify): Observation [10022]       B Medical/Surgery History Past Medical History:  Diagnosis Date  . Atrial fibrillation (HCC)   . Dementia (HCC)   . Hypertension    Past Surgical History:  Procedure Laterality  Date  . INTRAMEDULLARY (IM) NAIL INTERTROCHANTERIC Right 12/14/2018   Procedure: INTRAMEDULLARY (IM) NAIL INTERTROCHANTRIC;  Surgeon: Yolonda Kidaogers, Jason Patrick, MD;  [facility] OR;  Service: Orthopedics;  Laterality: Right;     A IV Location/Drains/Wounds Patient Lines/Drains/Airways Status   Active Line/Drains/Airways    Name:   Placement date:   Placement time:   Site:   Days:   Peripheral IV 01/02/19 Right Forearm   01/02/19    1248    Forearm   less than 1   External Urinary Catheter   12/14/18    1400    -   19   External Urinary Catheter   01/02/19    1125    -   less than 1   Incision (Closed) 12/14/18 Hip Right   12/14/18    1405     19          Intake/Output Last 24 hours  Intake/Output Summary (Last 24 hours) at 01/02/2019 1336 Last data filed at 01/02/2019 1148 Gross per 24 hour  Intake -  Output 100 ml  Net -100 ml    Labs/Imaging Results for orders placed or performed during the hospital encounter of 01/02/19 (from the past 48 hour(s))  Urinalysis, Routine w reflex microscopic     Status: None   Collection Time: 01/02/19 10:22 AM  Result Value Ref Range   Color, Urine YELLOW YELLOW   APPearance CLEAR CLEAR   Specific Gravity, Urine 1.011 1.005 - 1.030   pH 7.0 5.0 - 8.0   Glucose, UA NEGATIVE NEGATIVE mg/dL  Hgb urine dipstick NEGATIVE NEGATIVE   Bilirubin Urine NEGATIVE NEGATIVE   Ketones, ur NEGATIVE NEGATIVE mg/dL   Protein, ur NEGATIVE NEGATIVE mg/dL   Nitrite NEGATIVE NEGATIVE   Leukocytes,Ua NEGATIVE NEGATIVE    Comment: Performed at [facility], [address] [place], [place] [address]  CBC with Differential     Status: Abnormal   Collection Time: 01/02/19 11:01 AM  Result Value Ref Range   WBC 7.5 4.0 - 10.5 K/uL   RBC 3.09 (L) 3.87 - 5.11 MIL/uL   Hemoglobin 9.2 (L) 12.0 - 15.0 g/dL   HCT 54.6 (L) 50.3 - 54.6 %   MCV 97.1 80.0 - 100.0 fL   MCH 29.8 26.0 - 34.0 pg   MCHC 30.7 30.0 - 36.0 g/dL   RDW 56.8 (H) 12.7 - 51.7 %   Platelets 256 150 -  400 K/uL   nRBC 0.3 (H) 0.0 - 0.2 %   Neutrophils Relative % 79 %   Neutro Abs 6.0 1.7 - 7.7 K/uL   Lymphocytes Relative 7 %   Lymphs Abs 0.5 (L) 0.7 - 4.0 K/uL   Monocytes Relative 9 %   Monocytes Absolute 0.6 0.1 - 1.0 K/uL   Eosinophils Relative 3 %   Eosinophils Absolute 0.3 0.0 - 0.5 K/uL   Basophils Relative 1 %   Basophils Absolute 0.1 0.0 - 0.1 K/uL   Immature Granulocytes 1 %   Abs Immature Granulocytes 0.05 0.00 - 0.07 K/uL    Comment: Performed at [facility], [address] [place], [place] [address]  Basic metabolic panel     Status: Abnormal   Collection Time: 01/02/19 11:01 AM  Result Value Ref Range   Sodium 141 135 - 145 mmol/L   Potassium 3.9 3.5 - 5.1 mmol/L   Chloride 105 98 - 111 mmol/L   CO2 24 22 - 32 mmol/L   Glucose, Bld 108 (H) 70 - 99 mg/dL   BUN 25 (H) 8 - 23 mg/dL   Creatinine, Ser 9.44 0.44 - 1.00 mg/dL   Calcium 8.7 (L) 8.9 - 10.3 mg/dL   GFR calc non Af Amer >60 >60 mL/min   GFR calc Af Amer >60 >60 mL/min   Anion gap 12 5 - 15    Comment: Performed at [facility], [address] [place], [place] [address]  Brain natriuretic peptide     Status: Abnormal   Collection Time: 01/02/19 11:02 AM  Result Value Ref Range   B Natriuretic Peptide 257.0 (H) 0.0 - 100.0 pg/mL    Comment: Performed at [facility], [address], [place], [place] [address]  Troponin I - ONCE - STAT     Status: Abnormal   Collection Time: 01/02/19 11:02 AM  Result Value Ref Range   Troponin I 0.14 (HH) <0.03 ng/mL    Comment: CRITICAL RESULT CALLED TO, READ BACK BY AND VERIFIED WITH: CARDWELL,L AT 11:35AM ON 01/02/19 BY [facility] Performed at [facility], [address], [place], [place] [address]   D-dimer, quantitative (not at [facility])     Status: Abnormal   Collection Time: 01/02/19 11:29 AM  Result Value Ref Range   D-Dimer, Quant 5.01 (H) 0.00 - 0.50 ug/mL-FEU    Comment: (NOTE) At the manufacturer cut-off of 0.50 ug/mL FEU, this assay has been documented to  exclude PE with a sensitivity and negative predictive value of 97 to 99%.  At this time, this assay has not been approved by the FDA to exclude DVT/VTE. Results should be correlated  with clinical presentation. Performed at [facility], [address], [place], [place] 1[facility] Portable 1 View  Result Date: 01/02/2019 CLINICAL DATA:  85 year old female with a history of hypertension EXAM: PORTABLE CHEST 1 VIEW COMPARISON:  12/14/2018 CT 12/14/2018 FINDINGS: Cardiomediastinal silhouette unchanged with cardiomegaly. Fullness in the central vasculature with interlobular septal thickening. No pneumothorax. Blunting of the bilateral costophrenic angles. Meniscus at the right lung base. No pneumothorax. No displaced fracture. Soft tissue density surrounding the trachea, compatible with thyroid goiter which is evident on prior CT imaging. IMPRESSION: Acute CHF, with trace pleural effusions. Electronically Signed   By: Gilmer Mor D.O.   On: 01/02/2019 10:36    Pending Labs Unresulted Labs (From admission, onward)    Start     Ordered   01/02/19 1223  SARS Coronavirus 2 (CEPHEID - Performed in [facility] hospital lab), Hosp Order  (Asymptomatic Patients Labs)  Once,   R    Question:  Rule Out  Answer:  Yes   01/02/19 1222          Vitals/Pain Today's Vitals   01/02/19 1200 01/02/19 1230 01/02/19 1249 01/02/19 1300  BP: (!) 158/101  (!) 161/121 (!) 159/87  Pulse: (!) 112  (!) 121 89  Resp: (!) 26 (!) 26 (!) 21 20  Temp:      TempSrc:      SpO2: 98%  95% 94%  Weight:      Height:      PainSc:        Isolation Precautions No active isolations  Medications Medications  diltiazem (CARDIZEM CD) 24 hr capsule 300 mg (300 mg Oral Given 01/02/19 1113)  metoprolol tartrate (LOPRESSOR) tablet 25 mg (25 mg Oral Given 01/02/19 1056)  aspirin chewable tablet 324 mg (324 mg Oral Given 01/02/19 1155)  furosemide (LASIX) injection 60 mg (60 mg Intravenous Given 01/02/19 1259)   nitroGLYCERIN (NITROGLYN) 2 % ointment 0.5 inch (0.5 inches Topical Given 01/02/19 1255)    Mobility walks with person assist High fall risk   Focused Assessments    R Recommendations: See Admitting Provider Note  Report given to:   Additional Notes: Recruitment consultant

## 2019-01-02 NOTE — Progress Notes (Signed)
*  PRELIMINARY RESULTS* Echocardiogram 2D Echocardiogram has been performed.  Amy Randall 01/02/2019, 4:35 PM

## 2019-01-02 NOTE — Progress Notes (Signed)
Pt refusing to keep heart monitor on. MD made aware as well as central telemetry. Will continue to monitor and try to advocate for it to be put back on.

## 2019-01-02 NOTE — ED Triage Notes (Signed)
Pt from home. Daughter takes care of her.  C/o of "shaking" pt in AFIB, hypertensive.  Denies any pain.  Pt recently stopped taking her potassium for a recent surgery on her hip

## 2019-01-02 NOTE — H&P (Signed)
Patient Demographics:    Amy Randall, is a 85 y.o. female  MRN: [id number]   DOB - [birth year]  Admit Date - 01/02/2019  Outpatient Primary MD for the patient is Kari BaarsHawkins, Edward, MD   Assessment & Plan:    Principal Problem:   Acute CHF (congestive heart failure) (HCC) Active Problems:   Essential hypertension   Dementia without behavioral disturbance (HCC)   AF (paroxysmal atrial fibrillation) (HCC)   Tachycardia   1)Acute congestive heart failure----?? If systolic or diastolic CHF echocardiogram pending to determine EF, patient received Lasix 60 mg x 1 in the ED cardiology input appreciated,.  Monitor daily weights and fluid input and output monitoring, BNP was 257, clinical exam and chest x-ray consistent with CHF,  2)Elevated Troponin--- in the setting of tachycardia and hypertension, no obvious chest pain or discomfort... Cardiology input appreciated , no plan for invasive work-up, continue aspirin and metoprolol  3)Persistent A. Fib----not on anticoagulation apparently due to falls (patient recently had a fall with hip injury/fracture requiring orthopedic surgical intervention).. Continue metoprolol for rate control, continue Cardizem... Await echocardiogram if EF is low may discontinue Cardizem and titrate up metoprolol for rate control  4)HTN--- on presentation to the ED blood pressure was 185/129, ... After Lasix and other intervention blood pressure down to 156/101, continue metoprolol 25 mg twice daily, Cardizem 300 mg daily, may use IV labetalol as needed elevated BP  5) dementia with behavioral disturbance--- patient has advanced dementia, keep a close to nursing station, may have one-to-one sitter for safety, may use lorazepam as needed agitation  6) back pain----patient with chronic compression  fractures--T3, T4, T12 and L1 compression fractures appear chronic, PRN Tylenol, may use methocarbamol and low-dose tramadol  With History of - Reviewed by me  Past Medical History:  Diagnosis Date   Atrial fibrillation (HCC)    Dementia (HCC)    Hypertension       Past Surgical History:  Procedure Laterality Date   INTRAMEDULLARY (IM) NAIL INTERTROCHANTERIC Right 12/14/2018   Procedure: INTRAMEDULLARY (IM) NAIL INTERTROCHANTRIC;  Surgeon: Yolonda Kidaogers, Jason Patrick, MD;  Location: [facility] OR;  Service: Orthopedics;  Laterality: Right;    Chief Complaint  Patient presents with   Hypertension      HPI:    Amy Randall  is a 85 y.o. female with history significant of HTN; afib; and advanced dementia with significant cognitive deficits presents with shortness of breath and tachycardia  History obtained from available EMR records as patient is a very poor historian  EMS documented heart rate in the 150s en route to the hospital  In ED... Patient is found to be short of breath, hypertensive, tachycardic, chest x-ray consistent with congestive heart failure, EKG consistent with atrial fibrillation, troponin was borderline elevated, , hemoglobin was 9.2 which is around patient's baseline,   EDP requested admission for medical management  Additional history not obtainable due to significant dementia  D-dimer was elevated however CTA chest  without PE   Review of systems:    In addition to the HPI above,   A full Review of  Systems was done, all other systems reviewed are negative except as noted above in HPI , .    Social History:  Reviewed by me    Social History   Tobacco Use   Smoking status: Never Smoker   Smokeless tobacco: Never Used  Substance Use Topics   Alcohol use: Not on file       Family History :  Reviewed by me    Family History  Problem Relation Age of Onset   Hypertension Father    Stroke Father    Diabetes Maternal Grandmother       Home Medications:   Prior to Admission medications   Medication Sig Start Date End Date Taking? Authorizing Provider  aspirin EC 325 MG EC tablet Take 1 tablet (325 mg total) by mouth daily. Patient not taking: Reported on 01/02/2019 12/18/18 01/29/19  Jerald Kief, MD  docusate sodium (COLACE) 100 MG capsule Take 1 capsule (100 mg total) by mouth 2 (two) times daily. Patient not taking: Reported on 01/02/2019 12/17/18   Jerald Kief, MD     Allergies:     Allergies  Allergen Reactions   Bactrim [Sulfamethoxazole-Trimethoprim] Other (See Comments)    Loss of memory   Ibuprofen Other (See Comments)    Causes bruising   Orange Fruit [Citrus]     Per patient's daughter, orange snesitivity   Penicillins Rash    Has patient had a PCN reaction causing immediate rash, facial/tongue/throat swelling, SOB or lightheadedness with hypotension: Yes Has patient had a PCN reaction causing severe rash involving mucus membranes or skin necrosis: No Has patient had a PCN reaction that required hospitalization: No Has patient had a PCN reaction occurring within the last 10 years: No If all of the above answers are "NO", then may proceed with Cephalosporin use.      Physical Exam:   Vitals  Blood pressure (!) 156/101, pulse 83, temperature 98.2 F (36.8 C), temperature source Oral, resp. rate 18, height  (1.549 m), weight 45.4 kg, SpO2 98 %.  Physical Examination: General appearance - alert,  in no distress and  Mental status -confused and disoriented Eyes - sclera anicteric Neck - supple, no JVD elevation , Chest -diminished in bases with bibasilar rales  heart - S1 and S2 normal, irregularly irregular heart rate initially in the 120s Abdomen - soft, nontender, nondistended, no masses or organomegaly Neurological -significant cognitive deficits neck supple without rigidity, cranial nerves II through XII intact, DTR's normal and symmetric Extremities -1+ pedal edema noted, intact  peripheral pulses  Skin - warm, dry     Data Review:    CBC Recent Labs  Lab 01/02/19 1101  WBC 7.5  HGB 9.2*  HCT 30.0*  PLT 256  MCV 97.1  MCH 29.8  MCHC 30.7  RDW 18.6*  LYMPHSABS 0.5*  MONOABS 0.6  EOSABS 0.3  BASOSABS 0.1   ------------------------------------------------------------------------------------------------------------------  Chemistries  Recent Labs  Lab 01/02/19 1101  NA 141  K 3.9  CL 105  CO2 24  GLUCOSE 108*  BUN 25*  CREATININE 0.87  CALCIUM 8.7*   ------------------------------------------------------------------------------------------------------------------ estimated creatinine clearance is 35.1 mL/min (by C-G formula based on SCr of 0.87 mg/dL). ------------------------------------------------------------------------------------------------------------------ No results for input(s): TSH, T4TOTAL, T3FREE, THYROIDAB in the last 72 hours.  Invalid input(s): FREET3   Coagulation profile No results for input(s): INR, PROTIME in  the last 168 hours. ------------------------------------------------------------------------------------------------------------------- Recent Labs    01/02/19 1129  DDIMER 5.01*   -------------------------------------------------------------------------------------------------------------------  Cardiac Enzymes Recent Labs  Lab 01/02/19 1102  TROPONINI 0.14*   ------------------------------------------------------------------------------------------------------------------    Component Value Date/Time   BNP 257.0 (H) 01/02/2019 1102     ---------------------------------------------------------------------------------------------------------------  Urinalysis    Component Value Date/Time   COLORURINE YELLOW 01/02/2019 1022   APPEARANCEUR CLEAR 01/02/2019 1022   LABSPEC 1.011 01/02/2019 1022   PHURINE 7.0 01/02/2019 1022   GLUCOSEU NEGATIVE 01/02/2019 1022   HGBUR NEGATIVE 01/02/2019 1022    BILIRUBINUR NEGATIVE 01/02/2019 1022   KETONESUR NEGATIVE 01/02/2019 1022   PROTEINUR NEGATIVE 01/02/2019 1022   NITRITE NEGATIVE 01/02/2019 1022   LEUKOCYTESUR NEGATIVE 01/02/2019 1022    ----------------------------------------------------------------------------------------------------------------   Imaging Results:    Ct Angio Chest Pe W And/or Wo Contrast  Result Date: 01/02/2019 CLINICAL DATA:  Shortness of breath today. Patient status post repair of a hip fracture 12/14/2018. EXAM: CT ANGIOGRAPHY CHEST WITH CONTRAST TECHNIQUE: Multidetector CT imaging of the chest was performed using the standard protocol during bolus administration of intravenous contrast. Multiplanar CT image reconstructions and MIPs were obtained to evaluate the vascular anatomy. CONTRAST:  75 mL OMNIPAQUE IOHEXOL 350 MG/ML SOLN COMPARISON:  Single-view of the chest earlier today. FINDINGS: Cardiovascular: No pulmonary embolus is identified. Calcific aortic and coronary atherosclerosis is noted. There is marked cardiomegaly. No pericardial effusion. Mediastinum/Nodes: Thyromegaly is worse in the left lobe of the thyroid which extends into the mediastinum. No focal lesion is identified. No lymphadenopathy. The esophagus is unremarkable. Lungs/Pleura: Small bilateral pleural effusions are present. There is interlobular septal thickening and scattered ground-glass attenuation. No consolidative process, nodule or mass. Mild dependent atelectasis is noted. Upper Abdomen: Cystic lesion in the mid abdomen presumably is within the left lobe of the liver and measures 12 cm transverse by 8 cm AP. It is incompletely imaged. A second smaller hepatic cyst measuring 2.6 cm is noted. Parapelvic renal cysts on the left versus hydronephrosis noted. Musculoskeletal: Mild compression fracture deformities of T3 and T4 identified. Somewhat larger compression fracture of T12 is identified. Severe compression fracture of L1 is partially imaged. No  lytic lesion is identified. Review of the MIP images confirms the above findings. IMPRESSION: Negative for pulmonary embolus. Findings consistent with congestive heart failure with associated small pleural effusions and marked cardiomegaly. Partial visualization of the left kidney demonstrates parapelvic cysts versus hydronephrosis. If indicated, this could be further evaluated with ultrasound of the kidneys. Large cystic lesion in the upper abdomen is likely within the liver but incompletely visualized. T3, T4, T12 and L1 compression fractures appear chronic but cannot be definitively characterized. Most severe compression fracture is at L1 and incompletely imaged. Aortic Atherosclerosis (ICD10-I70.0). Electronically Signed   By: Drusilla Kanner M.D.   On: 01/02/2019 18:06   Dg Chest Portable 1 View  Result Date: 01/02/2019 CLINICAL DATA:  85 year old female with a history of hypertension EXAM: PORTABLE CHEST 1 VIEW COMPARISON:  12/14/2018 CT 12/14/2018 FINDINGS: Cardiomediastinal silhouette unchanged with cardiomegaly. Fullness in the central vasculature with interlobular septal thickening. No pneumothorax. Blunting of the bilateral costophrenic angles. Meniscus at the right lung base. No pneumothorax. No displaced fracture. Soft tissue density surrounding the trachea, compatible with thyroid goiter which is evident on prior CT imaging. IMPRESSION: Acute CHF, with trace pleural effusions. Electronically Signed   By: Gilmer Mor D.O.   On: 01/02/2019 10:36    Radiological Exams on Admission: Ct Angio Chest Pe W And/or Wo  Contrast  Result Date: 01/02/2019 CLINICAL DATA:  Shortness of breath today. Patient status post repair of a hip fracture 12/14/2018. EXAM: CT ANGIOGRAPHY CHEST WITH CONTRAST TECHNIQUE: Multidetector CT imaging of the chest was performed using the standard protocol during bolus administration of intravenous contrast. Multiplanar CT image reconstructions and MIPs were obtained to  evaluate the vascular anatomy. CONTRAST:  75 mL OMNIPAQUE IOHEXOL 350 MG/ML SOLN COMPARISON:  Single-view of the chest earlier today. FINDINGS: Cardiovascular: No pulmonary embolus is identified. Calcific aortic and coronary atherosclerosis is noted. There is marked cardiomegaly. No pericardial effusion. Mediastinum/Nodes: Thyromegaly is worse in the left lobe of the thyroid which extends into the mediastinum. No focal lesion is identified. No lymphadenopathy. The esophagus is unremarkable. Lungs/Pleura: Small bilateral pleural effusions are present. There is interlobular septal thickening and scattered ground-glass attenuation. No consolidative process, nodule or mass. Mild dependent atelectasis is noted. Upper Abdomen: Cystic lesion in the mid abdomen presumably is within the left lobe of the liver and measures 12 cm transverse by 8 cm AP. It is incompletely imaged. A second smaller hepatic cyst measuring 2.6 cm is noted. Parapelvic renal cysts on the left versus hydronephrosis noted. Musculoskeletal: Mild compression fracture deformities of T3 and T4 identified. Somewhat larger compression fracture of T12 is identified. Severe compression fracture of L1 is partially imaged. No lytic lesion is identified. Review of the MIP images confirms the above findings. IMPRESSION: Negative for pulmonary embolus. Findings consistent with congestive heart failure with associated small pleural effusions and marked cardiomegaly. Partial visualization of the left kidney demonstrates parapelvic cysts versus hydronephrosis. If indicated, this could be further evaluated with ultrasound of the kidneys. Large cystic lesion in the upper abdomen is likely within the liver but incompletely visualized. T3, T4, T12 and L1 compression fractures appear chronic but cannot be definitively characterized. Most severe compression fracture is at L1 and incompletely imaged. Aortic Atherosclerosis (ICD10-I70.0). Electronically Signed   By: Drusilla Kanner M.D.   On: 01/02/2019 18:06   Dg Chest Portable 1 View  Result Date: 01/02/2019 CLINICAL DATA:  85 year old female with a history of hypertension EXAM: PORTABLE CHEST 1 VIEW COMPARISON:  12/14/2018 CT 12/14/2018 FINDINGS: Cardiomediastinal silhouette unchanged with cardiomegaly. Fullness in the central vasculature with interlobular septal thickening. No pneumothorax. Blunting of the bilateral costophrenic angles. Meniscus at the right lung base. No pneumothorax. No displaced fracture. Soft tissue density surrounding the trachea, compatible with thyroid goiter which is evident on prior CT imaging. IMPRESSION: Acute CHF, with trace pleural effusions. Electronically Signed   By: Gilmer Mor D.O.   On: 01/02/2019 10:36    DVT Prophylaxis -SCD  /heparin AM Labs Ordered, also please review Full Orders  Family Communication: Admission, patients condition and plan of care including tests being ordered have been discussed with the patient and caregiver Code Status - Full Code  Likely DC to  TBD  Condition   stable  Shon Hale M.D on 01/02/2019 at 6:11 PM Go to [email or web address] -  for contact info  [facility] - Office  [phone]

## 2019-01-02 NOTE — Care Management Obs Status (Signed)
MEDICARE OBSERVATION STATUS NOTIFICATION   Patient Details  Name: Amy Randall MRN: [id number] Date of Birth: 02-10-1934   Medicare Observation Status Notification Given:  Yes    Ida Rogue, LCSW 01/02/2019, 2:31 PM

## 2019-01-02 NOTE — Consult Note (Addendum)
Cardiology Consultation:   Patient ID: Amy Randall MRN: [id number]; DOB: [birth year]  Admit date: 01/02/2019 Date of Consult: 01/02/2019  Primary Care Provider: [name], MD Primary Cardiologist: New, Dr Dina Rich Primary Electrophysiologist:  None *   Patient Profile:   Amy Randall is a 85 y.o. female with a hx of afib and dementia who is being seen today for the evaluation of SOB at the request of Dr. Mariea Clonts  History of Present Illness:   Amy Randall 85 yo female history of afib not on anticoag, dementia, HTN, recent hip fracture after a fall s/p surgical repair 5/9 presents with SOB. Patient has severe dementia and is poor historian.   From previous hospital notes severe dementia, essentially only knows her name at times  She is not able to give any significant history to me based on her dementia.    ER vitals 185/129 p 130s 97% WBC 7.5 Hgb 9.2 Plt 256 K 3.9 Cr 0.87 BUN 25 BNP 257  Trop 0.14--> EKG afib elevated rates, inferior TWIs chronic   Past Medical History:  Diagnosis Date  . Atrial fibrillation (HCC)   . Dementia (HCC)   . Hypertension     Past Surgical History:  Procedure Laterality Date  . INTRAMEDULLARY (IM) NAIL INTERTROCHANTERIC Right 12/14/2018   Procedure: INTRAMEDULLARY (IM) NAIL INTERTROCHANTRIC;  Surgeon: Yolonda Kida, MD;  Location: [facility] OR;  Service: Orthopedics;  Laterality: Right;     Inpatient Medications: Scheduled Meds: . diltiazem  300 mg Oral Daily  . furosemide  60 mg Intravenous Once  . nitroGLYCERIN  0.5 inch Topical Once   Continuous Infusions:  PRN Meds:   Allergies:    Allergies  Allergen Reactions  . Bactrim [Sulfamethoxazole-Trimethoprim] Other (See Comments)    Loss of memory  . Ibuprofen Other (See Comments)    Causes bruising  . Orange Fruit [Citrus]     Per patient's daughter, orange snesitivity  . Penicillins Rash    Has patient had a PCN reaction causing immediate rash,  facial/tongue/throat swelling, SOB or lightheadedness with hypotension: Yes Has patient had a PCN reaction causing severe rash involving mucus membranes or skin necrosis: No Has patient had a PCN reaction that required hospitalization: No Has patient had a PCN reaction occurring within the last 10 years: No If all of the above answers are "NO", then may proceed with Cephalosporin use.     Social History:   Social History   Socioeconomic History  . Marital status: Widowed    Spouse name: Not on file  . Number of children: Not on file  . Years of education: Not on file  . Highest education level: Not on file  Occupational History  . Not on file  Social Needs  . Financial resource strain: Not on file  . Food insecurity:    Worry: Not on file    Inability: Not on file  . Transportation needs:    Medical: Not on file    Non-medical: Not on file  Tobacco Use  . Smoking status: Never Smoker  . Smokeless tobacco: Never Used  Substance and Sexual Activity  . Alcohol use: Not on file  . Drug use: Not on file  . Sexual activity: Not on file  Lifestyle  . Physical activity:    Days per week: Not on file    Minutes per session: Not on file  . Stress: Not on file  Relationships  . Social connections:    Talks on phone: Not on  file    Gets together: Not on file    Attends religious service: Not on file    Active member of club or organization: Not on file    Attends meetings of clubs or organizations: Not on file    Relationship status: Not on file  . Intimate partner violence:    Fear of current or ex partner: Not on file    Emotionally abused: Not on file    Physically abused: Not on file    Forced sexual activity: Not on file  Other Topics Concern  . Not on file  Social History Narrative  . Not on file    Family History:    Family History  Problem Relation Age of Onset  . Hypertension Father   . Stroke Father   . Diabetes Maternal Grandmother      ROS:  Please  see the history of present illness.  All other ROS reviewed and negative.     Physical Exam/Data:   Vitals:   01/02/19 1030 01/02/19 1035 01/02/19 1100 01/02/19 1130  BP: (!) 165/118  (!) 185/129 (!) 161/111  Pulse: (!) 123  (!) 137 (!) 117  Resp: (!) 26  (!) 21 (!) 26  Temp:  98.7 F (37.1 C)    TempSrc:  Rectal    SpO2: 99%  97% 96%  Weight:      Height:        Intake/Output Summary (Last 24 hours) at 01/02/2019 1205 Last data filed at 01/02/2019 1148 Gross per 24 hour  Intake -  Output 100 ml  Net -100 ml   Last 3 Weights 01/02/2019 12/14/2018 01/26/2018  Weight (lbs) 100 lb 100 lb 15.5 oz 130 lb  Weight (kg) 45.36 kg 45.8 kg 58.968 kg     Body mass index is 18.89 kg/m.  General:  Well nourished, well developed elderly female, in no acute distress HEENT: normal Lymph: no adenopathy Neck: she refused JVD exam Endocrine:  No thryomegaly Vascular: No carotid bruits; FA pulses 2+ bilaterally without bruits  Cardiac: irreg, no m/r/g, no jvd Lungs:  She refused lung exam  Abd: soft, nontender, no hepatomegaly  Ext: 1+ bilateral LE edema Musculoskeletal:  No deformities, BUE and BLE strength normal and equal Skin: warm and dry  Neuro:  CNs 2-12 intact, no focal abnormalities noted Psych:  Normal affect     Laboratory Data:  Chemistry Recent Labs  Lab 01/02/19 1101  NA 141  K 3.9  CL 105  CO2 24  GLUCOSE 108*  BUN 25*  CREATININE 0.87  CALCIUM 8.7*  GFRNONAA >60  GFRAA >60  ANIONGAP 12    No results for input(s): PROT, ALBUMIN, AST, ALT, ALKPHOS, BILITOT in the last 168 hours. Hematology Recent Labs  Lab 01/02/19 1101  WBC 7.5  RBC 3.09*  HGB 9.2*  HCT 30.0*  MCV 97.1  MCH 29.8  MCHC 30.7  RDW 18.6*  PLT 256   Cardiac Enzymes Recent Labs  Lab 01/02/19 1102  TROPONINI 0.14*   No results for input(s): TROPIPOC in the last 168 hours.  BNP Recent Labs  Lab 01/02/19 1102  BNP 257.0*    DDimer No results for input(s): DDIMER in the last 168  hours.  Radiology/Studies:  Dg Chest Portable 1 View  Result Date: 01/02/2019 CLINICAL DATA:  85 year old female with a history of hypertension EXAM: PORTABLE CHEST 1 VIEW COMPARISON:  12/14/2018 CT 12/14/2018 FINDINGS: Cardiomediastinal silhouette unchanged with cardiomegaly. Fullness in the central vasculature with interlobular septal thickening.  No pneumothorax. Blunting of the bilateral costophrenic angles. Meniscus at the right lung base. No pneumothorax. No displaced fracture. Soft tissue density surrounding the trachea, compatible with thyroid goiter which is evident on prior CT imaging. IMPRESSION: Acute CHF, with trace pleural effusions. Electronically Signed   By: Gilmer Mor D.O.   On: 01/02/2019 10:36    Assessment and Plan:   1. Elevated troponin - nonspecific finding in setting of severe HTN at times in ER, tachycardia, CHF  - due to advanced age and dementia she is not a candidate for ischemic testing - would manage blood pressures, heart rates, and CHF medically. Cycle enzymes to establish peak for prognostic purposes, I don't see it affecting management.     2. Persistent Afib - long history - has not been on anticoag, I presume due to advanced age, dementia, and falls. I do not see this documented in her chart however and not clear on prior conversations. Chart does report multiple falls over the years, would not start anticoag at this time.   - elevated rates. She was given 300mg  of oral dilt in ER as well as oral lopressor 25mg , follow response with rates today. Continue dilt 300 daily and lopressor 25mg  bid  3. HTN - severe HTN at times in the ER - follow bp response with oral diltiazem already given as well as with diuresis   - elevated bp   4. Acute heart failure - evidence of pulm edema, elevated BNP - may be related to HTN, perhaps some elevated heart rates at time - with advanced age and dementia would not be aggressive in workup or management. Would  diurese with IV lasix (already written for 60mg  in ER, reevaluate tomorrow for repeat dosing). - we will obtain an echo as this would affect medical therapy for her HF and afib, though she may refuse she is confused and somewhat agitated at our visit   Would anticipate discharge tomorrow once diuresed, rates and bp's improved.      For questions or updates, please contact [facility] Please consult [email or web address] for contact info under     Signed, Dina Rich, MD  01/02/2019 12:05 PM

## 2019-01-02 NOTE — ED Provider Notes (Signed)
[facility]PART[facility] Provider Note   CSN: [id number] Arrival date & time: 01/02/19  0945    History   Chief Complaint Chief Complaint  Patient presents with  . Hypertension    HPI Amy Randall is a 85 y.o. female with a history of CHF, hypertension, paroxysmal a fib and dementia who recently underwent right hip surgery secondary to fall and fracture  presenting with complaint of shortness of breath since yesterday.  Daughter (per telephone) also reports her caregiver stated her pulse rate extremely high, over 200 prior to calling ems (per ems, greatest rate 150 per their monitoring).  She denies chest pain, cough, n/v, abdominal pain.    Level 5 caveat secondary to dementia      Hypertension  Associated symptoms include shortness of breath.    Past Medical History:  Diagnosis Date  . Atrial fibrillation (HCC)   . Dementia (HCC)   . Hypertension     Patient Active Problem List   Diagnosis Date Noted  . Hip fracture (HCC) 12/14/2018  . Essential hypertension 12/14/2018  . Dementia without behavioral disturbance (HCC) 12/14/2018  . AF (paroxysmal atrial fibrillation) (HCC) 12/14/2018  . Hyperglycemia 12/14/2018    Past Surgical History:  Procedure Laterality Date  . INTRAMEDULLARY (IM) NAIL INTERTROCHANTERIC Right 12/14/2018   Procedure: INTRAMEDULLARY (IM) NAIL INTERTROCHANTRIC;  Surgeon: Yolonda Kidaogers, Jason Patrick, MD;  [facility];  Service: Orthopedics;  Laterality: Right;     OB History   No obstetric history on file.      Home Medications    Prior to Admission medications   Medication Sig Start Date End Date Taking? Authorizing Provider  aspirin EC 325 MG EC tablet Take 1 tablet (325 mg total) by mouth daily. Patient not taking: Reported on 01/02/2019 12/18/18 01/29/19  Jerald Kiefhiu, Stephen K, MD  docusate sodium (COLACE) 100 MG capsule Take 1 capsule (100 mg total) by mouth 2 (two) times daily. Patient not taking: Reported on 01/02/2019 12/17/18    Jerald Kiefhiu, Stephen K, MD    Family History Family History  Problem Relation Age of Onset  . Hypertension Father   . Stroke Father   . Diabetes Maternal Grandmother     Social History Social History   Tobacco Use  . Smoking status: Never Smoker  . Smokeless tobacco: Never Used  Substance Use Topics  . Alcohol use: Not on file  . Drug use: Not on file     Allergies   Bactrim [sulfamethoxazole-trimethoprim]; Ibuprofen; Orange fruit [citrus]; and Penicillins   Review of Systems Review of Systems  Unable to perform ROS: Dementia  Respiratory: Positive for shortness of breath.      Physical Exam Updated Vital Signs BP (!) 161/111   Pulse (!) 117   Temp 98.7 F (37.1 C) (Rectal)   Resp (!) 26   Ht 5\' 1"  (1.549 m)   Wt 45.4 kg   SpO2 96%   BMI 18.89 kg/m   Physical Exam Vitals signs and nursing note reviewed.  Constitutional:      Appearance: She is well-developed.  HENT:     Head: Normocephalic and atraumatic.  Eyes:     Conjunctiva/sclera: Conjunctivae normal.  Neck:     Musculoskeletal: Normal range of motion.  Cardiovascular:     Rate and Rhythm: Normal rate. Rhythm irregularly irregular.     Pulses: Normal pulses.     Heart sounds: Normal heart sounds.  Pulmonary:     Effort: Pulmonary effort is normal.     Breath  sounds: Normal breath sounds. No wheezing.  Abdominal:     General: Bowel sounds are normal.     Palpations: Abdomen is soft.     Tenderness: There is no abdominal tenderness.  Musculoskeletal: Normal range of motion.  Skin:    General: Skin is warm and dry.  Neurological:     Mental Status: She is alert.     Comments: Pleasantly confused, cooperative.      ED Treatments / Results  Labs (all labs ordered are listed, but only abnormal results are displayed) Labs Reviewed  CBC WITH DIFFERENTIAL/PLATELET - Abnormal; Notable for the following components:      Result Value   RBC 3.09 (*)    Hemoglobin 9.2 (*)    HCT 30.0 (*)    RDW  18.6 (*)    nRBC 0.3 (*)    Lymphs Abs 0.5 (*)    All other components within normal limits  BASIC METABOLIC PANEL - Abnormal; Notable for the following components:   Glucose, Bld 108 (*)    BUN 25 (*)    Calcium 8.7 (*)    All other components within normal limits  BRAIN NATRIURETIC PEPTIDE - Abnormal; Notable for the following components:   B Natriuretic Peptide 257.0 (*)    All other components within normal limits  TROPONIN I - Abnormal; Notable for the following components:   Troponin I 0.14 (*)    All other components within normal limits  URINALYSIS, ROUTINE W REFLEX MICROSCOPIC  D-DIMER, QUANTITATIVE (NOT AT [facility])    EKG EKG Interpretation  Date/Time:  Thursday Jan 02 2019 09:53:05 EDT Ventricular Rate:  117 PR Interval:    QRS Duration: 74 QT Interval:  345 QTC Calculation: 482 R Axis:   89 Text Interpretation:  Atrial fibrillation Borderline right axis deviation Borderline low voltage, extremity leads Probable anteroseptal infarct, old Confirmed by Pricilla Loveless [phone]) on 01/02/2019 10:00:57 AM   Radiology Dg Chest Portable 1 View  Result Date: 01/02/2019 CLINICAL DATA:  85 year old female with a history of hypertension EXAM: PORTABLE CHEST 1 VIEW COMPARISON:  12/14/2018 CT 12/14/2018 FINDINGS: Cardiomediastinal silhouette unchanged with cardiomegaly. Fullness in the central vasculature with interlobular septal thickening. No pneumothorax. Blunting of the bilateral costophrenic angles. Meniscus at the right lung base. No pneumothorax. No displaced fracture. Soft tissue density surrounding the trachea, compatible with thyroid goiter which is evident on prior CT imaging. IMPRESSION: Acute CHF, with trace pleural effusions. Electronically Signed   By: Gilmer Mor D.O.   On: 01/02/2019 10:36    Procedures Procedures (including critical care time)  Medications Ordered in ED Medications  diltiazem (CARDIZEM CD) 24 hr capsule 300 mg (300 mg Oral Given 01/02/19 1113)   furosemide (LASIX) injection 60 mg (has no administration in time range)  nitroGLYCERIN (NITROGLYN) 2 % ointment 0.5 inch (has no administration in time range)  metoprolol tartrate (LOPRESSOR) tablet 25 mg (25 mg Oral Given 01/02/19 1056)  aspirin chewable tablet 324 mg (324 mg Oral Given 01/02/19 1155)     Initial Impression / Assessment and Plan / ED Course  I have reviewed the triage vital signs and the nursing notes.  Pertinent labs & imaging results that were available during my care of the patient were reviewed by me and considered in my medical decision making (see chart for details).        Pt with a fib which is chronic elevated bp (her morning metoprolol and cardizem given here) with sob, found to have increased CHF, also with  elevated troponin, but no c/o cp.  Discussed with Dr. Wyline Mood who will consult, advised diuresis and cycle troponins. Medicine admission.   She was given aspirin here, also started on lasix, nitro paste applied for diuresis, also to help with bp control.    Spoke with Dr. Mariea Clonts who accepts pt for admission.  covid ordered.   Final Clinical Impressions(s) / ED Diagnoses   Final diagnoses:  Acute on chronic congestive heart failure, unspecified heart failure type [facility])  Essential hypertension    ED Discharge Orders    None       Victoriano Lain 01/02/19 1222    Pricilla Loveless, MD 01/02/19 1319

## 2019-01-03 DIAGNOSIS — M4854XD Collapsed vertebra, not elsewhere classified, thoracic region, subsequent encounter for fracture with routine healing: Secondary | ICD-10-CM | POA: Diagnosis present

## 2019-01-03 DIAGNOSIS — S72141D Displaced intertrochanteric fracture of right femur, subsequent encounter for closed fracture with routine healing: Secondary | ICD-10-CM | POA: Diagnosis not present

## 2019-01-03 DIAGNOSIS — Z882 Allergy status to sulfonamides status: Secondary | ICD-10-CM | POA: Diagnosis not present

## 2019-01-03 DIAGNOSIS — Z823 Family history of stroke: Secondary | ICD-10-CM | POA: Diagnosis not present

## 2019-01-03 DIAGNOSIS — I119 Hypertensive heart disease without heart failure: Secondary | ICD-10-CM | POA: Diagnosis not present

## 2019-01-03 DIAGNOSIS — R5381 Other malaise: Secondary | ICD-10-CM | POA: Diagnosis not present

## 2019-01-03 DIAGNOSIS — Z880 Allergy status to penicillin: Secondary | ICD-10-CM | POA: Diagnosis not present

## 2019-01-03 DIAGNOSIS — R69 Illness, unspecified: Secondary | ICD-10-CM | POA: Diagnosis not present

## 2019-01-03 DIAGNOSIS — E43 Unspecified severe protein-calorie malnutrition: Secondary | ICD-10-CM | POA: Diagnosis not present

## 2019-01-03 DIAGNOSIS — D62 Acute posthemorrhagic anemia: Secondary | ICD-10-CM | POA: Diagnosis not present

## 2019-01-03 DIAGNOSIS — Z20828 Contact with and (suspected) exposure to other viral communicable diseases: Secondary | ICD-10-CM | POA: Diagnosis present

## 2019-01-03 DIAGNOSIS — Z743 Need for continuous supervision: Secondary | ICD-10-CM | POA: Diagnosis not present

## 2019-01-03 DIAGNOSIS — M4855XD Collapsed vertebra, not elsewhere classified, thoracolumbar region, subsequent encounter for fracture with routine healing: Secondary | ICD-10-CM | POA: Diagnosis present

## 2019-01-03 DIAGNOSIS — I480 Paroxysmal atrial fibrillation: Secondary | ICD-10-CM | POA: Diagnosis not present

## 2019-01-03 DIAGNOSIS — I5089 Other heart failure: Secondary | ICD-10-CM | POA: Diagnosis not present

## 2019-01-03 DIAGNOSIS — Z79899 Other long term (current) drug therapy: Secondary | ICD-10-CM | POA: Diagnosis not present

## 2019-01-03 DIAGNOSIS — Z886 Allergy status to analgesic agent status: Secondary | ICD-10-CM | POA: Diagnosis not present

## 2019-01-03 DIAGNOSIS — I110 Hypertensive heart disease with heart failure: Secondary | ICD-10-CM | POA: Diagnosis present

## 2019-01-03 DIAGNOSIS — R0602 Shortness of breath: Secondary | ICD-10-CM | POA: Diagnosis not present

## 2019-01-03 DIAGNOSIS — F0391 Unspecified dementia with behavioral disturbance: Secondary | ICD-10-CM | POA: Diagnosis present

## 2019-01-03 DIAGNOSIS — I4819 Other persistent atrial fibrillation: Secondary | ICD-10-CM | POA: Diagnosis present

## 2019-01-03 DIAGNOSIS — I5033 Acute on chronic diastolic (congestive) heart failure: Secondary | ICD-10-CM | POA: Diagnosis present

## 2019-01-03 DIAGNOSIS — R739 Hyperglycemia, unspecified: Secondary | ICD-10-CM | POA: Diagnosis not present

## 2019-01-03 DIAGNOSIS — I5031 Acute diastolic (congestive) heart failure: Secondary | ICD-10-CM

## 2019-01-03 DIAGNOSIS — Z8249 Family history of ischemic heart disease and other diseases of the circulatory system: Secondary | ICD-10-CM | POA: Diagnosis not present

## 2019-01-03 DIAGNOSIS — Z7982 Long term (current) use of aspirin: Secondary | ICD-10-CM | POA: Diagnosis not present

## 2019-01-03 LAB — BASIC METABOLIC PANEL
Anion gap: 9 (ref 5–15)
BUN: 20 mg/dL (ref 8–23)
CO2: 28 mmol/L (ref 22–32)
Calcium: 8.4 mg/dL — ABNORMAL LOW (ref 8.9–10.3)
Chloride: 104 mmol/L (ref 98–111)
Creatinine, Ser: 0.74 mg/dL (ref 0.44–1.00)
GFR calc Af Amer: >60 mL/min (ref >60)
GFR calc non Af Amer: >60 mL/min (ref >60)
Glucose, Bld: 91 mg/dL (ref 70–99)
Potassium: 3.5 mmol/L (ref 3.5–5.1)
Sodium: 141 mmol/L (ref 135–145)

## 2019-01-03 LAB — CBC
HCT: 28.4 % — ABNORMAL LOW (ref 36.0–46.0)
Hemoglobin: 8.9 g/dL — ABNORMAL LOW (ref 12.0–15.0)
MCH: 30.3 pg (ref 26.0–34.0)
MCHC: 31.3 g/dL (ref 30.0–36.0)
MCV: 96.6 fL (ref 80.0–100.0)
Platelets: 240 10*3/uL (ref 150–400)
RBC: 2.94 MIL/uL — ABNORMAL LOW (ref 3.87–5.11)
RDW: 18.1 % — ABNORMAL HIGH (ref 11.5–15.5)
WBC: 6 10*3/uL (ref 4.0–10.5)
nRBC: 0 % (ref 0.0–0.2)

## 2019-01-03 LAB — ECHOCARDIOGRAM COMPLETE
Height: 61 in
Weight: 1600 oz

## 2019-01-03 MED ORDER — FUROSEMIDE 20 MG PO TABS
20.0000 mg | ORAL_TABLET | Freq: Every day | ORAL | Status: DC
  Administered 2019-01-04: 11:00:00 20 mg via ORAL
  Filled 2019-01-03: qty 1

## 2019-01-03 MED ORDER — DILTIAZEM HCL ER COATED BEADS 180 MG PO CP24
360.0000 mg | ORAL_CAPSULE | Freq: Every day | ORAL | Status: DC
  Administered 2019-01-03 – 2019-01-04 (×2): 360 mg via ORAL
  Filled 2019-01-03 (×2): qty 2

## 2019-01-03 NOTE — Progress Notes (Signed)
Subjective: She was admitted with heart failure.  She seems to be doing better and she is down 3.1 L.  Her BUN and creatinine are better.  She has been changed to oral Lasix now.  Her blood pressure is trending down but still elevated.  She is very sleepy this morning.  Objective: Vital signs in last 24 hours: Temp:  [97.5 F (36.4 C)-98.7 F (37.1 C)] 97.8 F (36.6 C) (05/29 0644) Pulse Rate:  [83-137] 93 (05/29 0644) Resp:  [16-26] 16 (05/29 0644) BP: (137-185)/(81-129) 146/93 (05/29 0644) SpO2:  [94 %-99 %] 96 % (05/29 0644) Weight:  [45.4 kg] 45.4 kg (05/28 0950) Weight change:     Intake/Output from previous day: 05/28 0701 - 05/29 0700 In: 120 [P.O.:120] Out: 3300 [Urine:3300]  PHYSICAL EXAM General appearance: Sleepy but arousable Resp: rhonchi bilaterally Cardio: irregularly irregular rhythm GI: soft, non-tender; bowel sounds normal; no masses,  no organomegaly Extremities: Trace edema  Lab Results:  Results for orders placed or performed during the hospital encounter of 01/02/19 (from the past 48 hour(s))  Urinalysis, Routine w reflex microscopic     Status: None   Collection Time: 01/02/19 10:22 AM  Result Value Ref Range   Color, Urine YELLOW YELLOW   APPearance CLEAR CLEAR   Specific Gravity, Urine 1.011 1.005 - 1.030   pH 7.0 5.0 - 8.0   Glucose, UA NEGATIVE NEGATIVE mg/dL   Hgb urine dipstick NEGATIVE NEGATIVE   Bilirubin Urine NEGATIVE NEGATIVE   Ketones, ur NEGATIVE NEGATIVE mg/dL   Protein, ur NEGATIVE NEGATIVE mg/dL   Nitrite NEGATIVE NEGATIVE   Leukocytes,Ua NEGATIVE NEGATIVE    Comment: Performed at [facility] [facility], [address], [place], [place] [address]  CBC with Differential     Status: Abnormal   Collection Time: 01/02/19 11:01 AM  Result Value Ref Range   WBC 7.5 4.0 - 10.5 K/uL   RBC 3.09 (L) 3.87 - 5.11 MIL/uL   Hemoglobin 9.2 (L) 12.0 - 15.0 g/dL   HCT 60.4 (L) 54.0 - 98.1 %   MCV 97.1 80.0 - 100.0 fL   MCH 29.8 26.0 - 34.0 pg   MCHC  30.7 30.0 - 36.0 g/dL   RDW 19.1 (H) 47.8 - 29.5 %   Platelets 256 150 - 400 K/uL   nRBC 0.3 (H) 0.0 - 0.2 %   Neutrophils Relative % 79 %   Neutro Abs 6.0 1.7 - 7.7 K/uL   Lymphocytes Relative 7 %   Lymphs Abs 0.5 (L) 0.7 - 4.0 K/uL   Monocytes Relative 9 %   Monocytes Absolute 0.6 0.1 - 1.0 K/uL   Eosinophils Relative 3 %   Eosinophils Absolute 0.3 0.0 - 0.5 K/uL   Basophils Relative 1 %   Basophils Absolute 0.1 0.0 - 0.1 K/uL   Immature Granulocytes 1 %   Abs Immature Granulocytes 0.05 0.00 - 0.07 K/uL    Comment: Performed at [facility], [address] [place], [place] [address]  Basic metabolic panel     Status: Abnormal   Collection Time: 01/02/19 11:01 AM  Result Value Ref Range   Sodium 141 135 - 145 mmol/L   Potassium 3.9 3.5 - 5.1 mmol/L   Chloride 105 98 - 111 mmol/L   CO2 24 22 - 32 mmol/L   Glucose, Bld 108 (H) 70 - 99 mg/dL   BUN 25 (H) 8 - 23 mg/dL   Creatinine, Ser 8.65 0.44 - 1.00 mg/dL   Calcium 8.7 (L) 8.9 - 10.3 mg/dL  GFR calc non Af Amer >60 >60 mL/min   GFR calc Af Amer >60 >60 mL/min   Anion gap 12 5 - 15    Comment: Performed at [facility], [address], [place], [place] [address]  Brain natriuretic peptide     Status: Abnormal   Collection Time: 01/02/19 11:02 AM  Result Value Ref Range   B Natriuretic Peptide 257.0 (H) 0.0 - 100.0 pg/mL    Comment: Performed at [facility], [address] [place], [place] [address]  Troponin I - ONCE - STAT     Status: Abnormal   Collection Time: 01/02/19 11:02 AM  Result Value Ref Range   Troponin I 0.14 (HH) <0.03 ng/mL    Comment: CRITICAL RESULT CALLED TO, READ BACK BY AND VERIFIED WITH: CARDWELL,L AT 11:35AM ON 01/02/19 BY [facility] Performed at [facility], [address], [place], [place] [address]   D-dimer, quantitative (not at [facility])     Status: Abnormal   Collection Time: 01/02/19 11:29 AM  Result Value Ref Range   D-Dimer, Quant 5.01 (H) 0.00 - 0.50 ug/mL-FEU    Comment: (NOTE) At the  manufacturer cut-off of 0.50 ug/mL FEU, this assay has been documented to exclude PE with a sensitivity and negative predictive value of 97 to 99%.  At this time, this assay has not been approved by the FDA to exclude DVT/VTE. Results should be correlated with clinical presentation. Performed at [facility], [address] [place], [place] [address]   SARS Coronavirus 2 (CEPHEID - Performed in [facility] hospital lab), Hosp Order     Status: None   Collection Time: 01/02/19 12:40 PM  Result Value Ref Range   SARS Coronavirus 2 NEGATIVE NEGATIVE    Comment: (NOTE) If result is NEGATIVE SARS-CoV-2 target nucleic acids are NOT DETECTED. The SARS-CoV-2 RNA is generally detectable in upper and lower  respiratory specimens during the acute phase of infection. The lowest  concentration of SARS-CoV-2 viral copies this assay can detect is 250  copies / mL. A negative result does not preclude SARS-CoV-2 infection  and should not be used as the sole basis for treatment or other  patient management decisions.  A negative result may occur with  improper specimen collection / handling, submission of specimen other  than nasopharyngeal swab, presence of viral mutation(s) within the  areas targeted by this assay, and inadequate number of viral copies  (<250 copies / mL). A negative result must be combined with clinical  observations, patient history, and epidemiological information. If result is POSITIVE SARS-CoV-2 target nucleic acids are DETECTED. The SARS-CoV-2 RNA is generally detectable in upper and lower  respiratory specimens dur ing the acute phase of infection.  Positive  results are indicative of active infection with SARS-CoV-2.  Clinical  correlation with patient history and other diagnostic information is  necessary to determine patient infection status.  Positive results do  not rule out bacterial infection or co-infection with other viruses. If result is PRESUMPTIVE  POSTIVE SARS-CoV-2 nucleic acids MAY BE PRESENT.   A presumptive positive result was obtained on the submitted specimen  and confirmed on repeat testing.  While 2019 novel coronavirus  (SARS-CoV-2) nucleic acids may be present in the submitted sample  additional confirmatory testing may be necessary for epidemiological  and / or clinical management purposes  to differentiate between  SARS-CoV-2 and other Sarbecovirus currently known to infect humans.  If clinically indicated additional testing with an alternate test  methodology [phone]) is advised. The  SARS-CoV-2 RNA is generally  detectable in upper and lower respiratory sp ecimens during the acute  phase of infection. The expected result is Negative. Fact Sheet for Patients:  BoilerBrush.com.cyhttps://www.fda.gov/media/136312/download Fact Sheet for Healthcare Providers: https://pope.com/https://www.fda.gov/media/136313/download This test is not yet approved or cleared by the [place] FDA and has been authorized for detection and/or diagnosis of SARS-CoV-2 by FDA under an Emergency Use Authorization (EUA).  This EUA will remain in effect (meaning this test can be used) for the duration of the COVID-19 declaration under Section 564(b)(1) of the Act, 21 U.S.C. section 360bbb-3(b)(1), unless the authorization is terminated or revoked sooner. Performed at [facility], [address] [place], [place] [address]   Basic metabolic panel     Status: Abnormal   Collection Time: 01/03/19  5:27 AM  Result Value Ref Range   Sodium 141 135 - 145 mmol/L   Potassium 3.5 3.5 - 5.1 mmol/L   Chloride 104 98 - 111 mmol/L   CO2 28 22 - 32 mmol/L   Glucose, Bld 91 70 - 99 mg/dL   BUN 20 8 - 23 mg/dL   Creatinine, Ser 6.040.74 0.44 - 1.00 mg/dL   Calcium 8.4 (L) 8.9 - 10.3 mg/dL   GFR calc non Af Amer >60 >60 mL/min   GFR calc Af Amer >60 >60 mL/min   Anion gap 9 5 - 15    Comment: Performed at [facility], [address] [place], [place] [address]  CBC     Status: Abnormal    Collection Time: 01/03/19  5:27 AM  Result Value Ref Range   WBC 6.0 4.0 - 10.5 K/uL   RBC 2.94 (L) 3.87 - 5.11 MIL/uL   Hemoglobin 8.9 (L) 12.0 - 15.0 g/dL   HCT 11.928.4 (L) 14.736.0 - 82.946.0 %   MCV 96.6 80.0 - 100.0 fL   MCH 30.3 26.0 - 34.0 pg   MCHC 31.3 30.0 - 36.0 g/dL   RDW 56.218.1 (H) 13.011.5 - 86.515.5 %   Platelets 240 150 - 400 K/uL   nRBC 0.0 0.0 - 0.2 %    Comment: Performed at [facility], [address] [place], [place] [address]    ABGS No results for input(s): PHART, PO2ART, TCO2, HCO3 in the last 72 hours.  Invalid input(s): PCO2 CULTURES Recent Results (from the past 240 hour(s))  SARS Coronavirus 2 (CEPHEID - Performed in [facility] hospital lab), Hosp Order     Status: None   Collection Time: 01/02/19 12:40 PM  Result Value Ref Range Status   SARS Coronavirus 2 NEGATIVE NEGATIVE Final    Comment: (NOTE) If result is NEGATIVE SARS-CoV-2 target nucleic acids are NOT DETECTED. The SARS-CoV-2 RNA is generally detectable in upper and lower  respiratory specimens during the acute phase of infection. The lowest  concentration of SARS-CoV-2 viral copies this assay can detect is 250  copies / mL. A negative result does not preclude SARS-CoV-2 infection  and should not be used as the sole basis for treatment or other  patient management decisions.  A negative result may occur with  improper specimen collection / handling, submission of specimen other  than nasopharyngeal swab, presence of viral mutation(s) within the  areas targeted by this assay, and inadequate number of viral copies  (<250 copies / mL). A negative result must be combined with clinical  observations, patient history, and epidemiological information. If result is POSITIVE SARS-CoV-2 target nucleic acids are DETECTED. The SARS-CoV-2 RNA is generally detectable in upper and lower  respiratory specimens dur ing the acute phase of infection.  Positive  results are indicative of active infection with SARS-CoV-2.   Clinical  correlation with patient history and other diagnostic information is  necessary to determine patient infection status.  Positive results do  not rule out bacterial infection or co-infection with other viruses. If result is PRESUMPTIVE POSTIVE SARS-CoV-2 nucleic acids MAY BE PRESENT.   A presumptive positive result was obtained on the submitted specimen  and confirmed on repeat testing.  While 2019 novel coronavirus  (SARS-CoV-2) nucleic acids may be present in the submitted sample  additional confirmatory testing may be necessary for epidemiological  and / or clinical management purposes  to differentiate between  SARS-CoV-2 and other Sarbecovirus currently known to infect humans.  If clinically indicated additional testing with an alternate test  methodology [phone]) is advised. The SARS-CoV-2 RNA is generally  detectable in upper and lower respiratory sp ecimens during the acute  phase of infection. The expected result is Negative. Fact Sheet for Patients:  BoilerBrush.com.cy Fact Sheet for Healthcare Providers: https://pope.com/ This test is not yet approved or cleared by the [place] FDA and has been authorized for detection and/or diagnosis of SARS-CoV-2 by FDA under an Emergency Use Authorization (EUA).  This EUA will remain in effect (meaning this test can be used) for the duration of the COVID-19 declaration under Section 564(b)(1) of the Act, 21 U.S.C. section 360bbb-3(b)(1), unless the authorization is terminated or revoked sooner. Performed at [facility], [address] [place], [place] [address]    Studies/Results: Ct Angio Chest Pe W And/or Wo Contrast  Result Date: 01/02/2019 CLINICAL DATA:  Shortness of breath today. Patient status post repair of a hip fracture 12/14/2018. EXAM: CT ANGIOGRAPHY CHEST WITH CONTRAST TECHNIQUE: Multidetector CT imaging of the chest was performed using the standard protocol  during bolus administration of intravenous contrast. Multiplanar CT image reconstructions and MIPs were obtained to evaluate the vascular anatomy. CONTRAST:  75 mL OMNIPAQUE IOHEXOL 350 MG/ML SOLN COMPARISON:  Single-view of the chest earlier today. FINDINGS: Cardiovascular: No pulmonary embolus is identified. Calcific aortic and coronary atherosclerosis is noted. There is marked cardiomegaly. No pericardial effusion. Mediastinum/Nodes: Thyromegaly is worse in the left lobe of the thyroid which extends into the mediastinum. No focal lesion is identified. No lymphadenopathy. The esophagus is unremarkable. Lungs/Pleura: Small bilateral pleural effusions are present. There is interlobular septal thickening and scattered ground-glass attenuation. No consolidative process, nodule or mass. Mild dependent atelectasis is noted. Upper Abdomen: Cystic lesion in the mid abdomen presumably is within the left lobe of the liver and measures 12 cm transverse by 8 cm AP. It is incompletely imaged. A second smaller hepatic cyst measuring 2.6 cm is noted. Parapelvic renal cysts on the left versus hydronephrosis noted. Musculoskeletal: Mild compression fracture deformities of T3 and T4 identified. Somewhat larger compression fracture of T12 is identified. Severe compression fracture of L1 is partially imaged. No lytic lesion is identified. Review of the MIP images confirms the above findings. IMPRESSION: Negative for pulmonary embolus. Findings consistent with congestive heart failure with associated small pleural effusions and marked cardiomegaly. Partial visualization of the left kidney demonstrates parapelvic cysts versus hydronephrosis. If indicated, this could be further evaluated with ultrasound of the kidneys. Large cystic lesion in the upper abdomen is likely within the liver but incompletely visualized. T3, T4, T12 and L1 compression fractures appear chronic but cannot be definitively characterized. Most severe compression  fracture is at L1 and incompletely imaged. Aortic Atherosclerosis (ICD10-I70.0).  Electronically Signed   By: Drusilla Kanner M.D.   On: 01/02/2019 18:06   Dg Chest Portable 1 View  Result Date: 01/02/2019 CLINICAL DATA:  85 year old female with a history of hypertension EXAM: PORTABLE CHEST 1 VIEW COMPARISON:  12/14/2018 CT 12/14/2018 FINDINGS: Cardiomediastinal silhouette unchanged with cardiomegaly. Fullness in the central vasculature with interlobular septal thickening. No pneumothorax. Blunting of the bilateral costophrenic angles. Meniscus at the right lung base. No pneumothorax. No displaced fracture. Soft tissue density surrounding the trachea, compatible with thyroid goiter which is evident on prior CT imaging. IMPRESSION: Acute CHF, with trace pleural effusions. Electronically Signed   By: Gilmer Mor D.O.   On: 01/02/2019 10:36    Medications:  Prior to Admission:  Medications Prior to Admission  Medication Sig Dispense Refill Last Dose  . aspirin EC 325 MG EC tablet Take 1 tablet (325 mg total) by mouth daily. (Patient not taking: Reported on 01/02/2019) 42 tablet 0 Not Taking at Unknown time  . docusate sodium (COLACE) 100 MG capsule Take 1 capsule (100 mg total) by mouth 2 (two) times daily. (Patient not taking: Reported on 01/02/2019) 30 capsule 0 Not Taking at Unknown time   Scheduled: . aspirin  81 mg Oral Daily  . diltiazem  360 mg Oral Daily  . [START ON 01/04/2019] furosemide  20 mg Oral Daily  . heparin  5,000 Units Subcutaneous Q8H  . methocarbamol  500 mg Oral BID  . metoprolol tartrate  25 mg Oral BID  . sodium chloride flush  3 mL Intravenous Q12H   Continuous: . sodium chloride     AOZ:HYQMVH chloride, acetaminophen **OR** acetaminophen, albuterol, labetalol, LORazepam, ondansetron **OR** ondansetron (ZOFRAN) IV, polyethylene glycol, sodium chloride flush, traMADol, traZODone  Assesment: She was admitted with heart failure.  She is better but still has some  abnormal lung sounds.  Her blood pressure is still up some.  She is sleepy this morning.  She has atrial for which appears to be paroxysmal and is not a candidate for anticoagulation  She has severe dementia at baseline  She had recent hip fracture Principal Problem:   Acute CHF (congestive heart failure) (HCC) Active Problems:   Essential hypertension   Dementia without behavioral disturbance (HCC)   AF (paroxysmal atrial fibrillation) (HCC)   Tachycardia    Plan: Continue treatments    LOS: 0 days   Fredirick Maudlin 01/03/2019, 9:18 AM

## 2019-01-03 NOTE — Discharge Instructions (Signed)
YOUR CARDIOLOGY TEAM HAS ARRANGED FOR AN E-VISIT FOR YOUR APPOINTMENT - PLEASE REVIEW IMPORTANT INFORMATION BELOW SEVERAL DAYS PRIOR TO YOUR APPOINTMENT ° °Due to the recent COVID-19 pandemic, we are transitioning in-person office visits to tele-medicine visits in an effort to decrease unnecessary exposure to our patients, their families, and staff. These visits are billed to your insurance just like a normal visit is. We also encourage you to sign up for MyChart if you have not already done so. You will need a smartphone if possible. For patients that do not have this, we can still complete the visit using a regular telephone but do prefer a smartphone to enable video when possible. You may have a family member that lives with you that can help. If possible, we also ask that you have a blood pressure cuff and scale at home to measure your blood pressure, heart rate and weight prior to your scheduled appointment. Patients with clinical needs that need an in-person evaluation and testing will still be able to come to the office if absolutely necessary. If you have any questions, feel free to call our office. ° ° ° °2-3 DAYS BEFORE YOUR APPOINTMENT ° °You will receive a telephone call from one of our [facility] team members - your caller ID may say "Unknown caller." If this is a video visit, we will walk you through how to get the video launched on your phone. We will remind you check your blood pressure, heart rate and weight prior to your scheduled appointment. If you have an Apple Watch or Kardia, please upload any pertinent ECG strips the day before or morning of your appointment to MyChart. Our staff will also make sure you have reviewed the consent and agree to move forward with your scheduled tele-health visit.  ° ° ° °THE DAY OF YOUR APPOINTMENT ° °Approximately 15 minutes prior to your scheduled appointment, you will receive a telephone call from one of [facility] team - your caller ID may say "Unknown caller."   Our staff will confirm medications, vital signs for the day and any symptoms you may be experiencing. Please have this information available prior to the time of visit start. It may also be helpful for you to have a pad of paper and pen handy for any instructions given during your visit. They will also walk you through joining the smartphone meeting if this is a video visit. ° ° ° °CONSENT FOR TELE-HEALTH VISIT - PLEASE REVIEW ° °I hereby voluntarily request, consent and authorize [facility] and its employed or contracted physicians, physician assistants, nurse practitioners or other licensed health care professionals (the Practitioner), to provide me with telemedicine health care services (the “Services") as deemed necessary by the treating Practitioner. I acknowledge and consent to receive the Services by the Practitioner via telemedicine. I understand that the telemedicine visit will involve communicating with the Practitioner through live audiovisual communication technology and the disclosure of certain medical information by electronic transmission. I acknowledge that I have been given the opportunity to request an in-person assessment or other available alternative prior to the telemedicine visit and am voluntarily participating in the telemedicine visit. ° °I understand that I have the right to withhold or withdraw my consent to the use of telemedicine in the course of my care at any time, without affecting my right to future care or treatment, and that the Practitioner or I may terminate the telemedicine visit at any time. I understand that I have the right to inspect all information   obtained and/or recorded in the course of the telemedicine visit and may receive copies of available information for a reasonable fee.  I understand that some of the potential risks of receiving the Services via telemedicine include:  °• Delay or interruption in medical evaluation due to technological equipment failure or  disruption; °• Information transmitted may not be sufficient (e.g. poor resolution of images) to allow for appropriate medical decision making by the Practitioner; and/or  °• In rare instances, security protocols could fail, causing a breach of personal health information. ° °Furthermore, I acknowledge that it is my responsibility to provide information about my medical history, conditions and care that is complete and accurate to the best of my ability. I acknowledge that Practitioner's advice, recommendations, and/or decision may be based on factors not within their control, such as incomplete or inaccurate data provided by me or distortions of diagnostic images or specimens that may result from electronic transmissions. I understand that the practice of medicine is not an exact science and that Practitioner makes no warranties or guarantees regarding treatment outcomes. I acknowledge that I will receive a copy of this consent concurrently upon execution via email to the email address I last provided but may also request a printed copy by calling the office of [facility].   ° °I understand that my insurance will be billed for this visit.  ° °I have read or had this consent read to me. °• I understand the contents of this consent, which adequately explains the benefits and risks of the Services being provided via telemedicine.  °• I have been provided ample opportunity to ask questions regarding this consent and the Services and have had my questions answered to my satisfaction. °• I give my informed consent for the services to be provided through the use of telemedicine in my medical care ° °By participating in this telemedicine visit I agree to the above. ° °

## 2019-01-03 NOTE — Progress Notes (Signed)
Progress Note  Patient Name: Amy Randall Date of Encounter: 01/03/2019  Primary Cardiologist: New, Dr Dina Rich  Subjective   No complaints  Inpatient Medications    Scheduled Meds:  aspirin  81 mg Oral Daily   diltiazem  300 mg Oral Daily   furosemide  20 mg Intravenous Q12H   heparin  5,000 Units Subcutaneous Q8H   methocarbamol  500 mg Oral BID   metoprolol tartrate  25 mg Oral BID   sodium chloride flush  3 mL Intravenous Q12H   Continuous Infusions:  sodium chloride     PRN Meds: sodium chloride, acetaminophen **OR** acetaminophen, albuterol, labetalol, LORazepam, ondansetron **OR** ondansetron (ZOFRAN) IV, polyethylene glycol, sodium chloride flush, traMADol, traZODone   Vital Signs    Vitals:   01/02/19 1421 01/02/19 1946 01/03/19 0000 01/03/19 0644  BP: (!) 156/101  (!) 154/92 (!) 146/93  Pulse: 83  83 93  Resp: Temp: 98.2 F (36.8 C)  98.4 F (36.9 C) 97.8 F (36.6 C)  TempSrc: Oral  Oral Oral  SpO2: 98% 96% 96% 96%  Weight:      Height:        Intake/Output Summary (Last 24 hours) at 01/03/2019 0830 Last data filed at 01/03/2019 1610 Gross per 24 hour  Intake 120 ml  Output 3300 ml  Net -3180 ml   Last 3 Weights 01/02/2019 12/14/2018 01/26/2018  Weight (lbs) 100 lb 100 lb 15.5 oz 130 lb  Weight (kg) 45.36 kg 45.8 kg 58.968 kg      Telemetry    afib varaible rates- Personally Reviewed  ECG    na - Personally Reviewed  Physical Exam   GEN: No acute distress.   Neck: No JVD Cardiac: irreg, no murmurs, rubs, or gallops.  Respiratory: faint crackles bilaterally GI: Soft, nontender, non-distended  MS: No edema; No deformity. Neuro:  Nonfocal  Psych: Normal affect   Labs    Chemistry Recent Labs  Lab 01/02/19 1101 01/03/19 0527  NA 141 141  K 3.9 3.5  CL 105 104  CO2 24 28  GLUCOSE 108* 91  BUN 25* 20  CREATININE 0.87 0.74  CALCIUM 8.7* 8.4*  GFRNONAA >60 >60  GFRAA >60 >60  ANIONGAP 12 9      Hematology Recent Labs  Lab 01/02/19 1101 01/03/19 0527  WBC 7.5 6.0  RBC 3.09* 2.94*  HGB 9.2* 8.9*  HCT 30.0* 28.4*  MCV 97.1 96.6  MCH 29.8 30.3  MCHC 30.7 31.3  RDW 18.6* 18.1*  PLT 256 240    Cardiac Enzymes Recent Labs  Lab 01/02/19 1102  TROPONINI 0.14*   No results for input(s): TROPIPOC in the last 168 hours.   BNP Recent Labs  Lab 01/02/19 1102  BNP 257.0*     DDimer  Recent Labs  Lab 01/02/19 1129  DDIMER 5.01*     Radiology    Ct Angio Chest Pe W And/or Wo Contrast  Result Date: 01/02/2019 CLINICAL DATA:  Shortness of breath today. Patient status post repair of a hip fracture 12/14/2018. EXAM: CT ANGIOGRAPHY CHEST WITH CONTRAST TECHNIQUE: Multidetector CT imaging of the chest was performed using the standard protocol during bolus administration of intravenous contrast. Multiplanar CT image reconstructions and MIPs were obtained to evaluate the vascular anatomy. CONTRAST:  75 mL OMNIPAQUE IOHEXOL 350 MG/ML SOLN COMPARISON:  Single-view of the chest earlier today. FINDINGS: Cardiovascular: No pulmonary embolus is identified. Calcific aortic and coronary atherosclerosis is noted. There is marked  cardiomegaly. No pericardial effusion. Mediastinum/Nodes: Thyromegaly is worse in the left lobe of the thyroid which extends into the mediastinum. No focal lesion is identified. No lymphadenopathy. The esophagus is unremarkable. Lungs/Pleura: Small bilateral pleural effusions are present. There is interlobular septal thickening and scattered ground-glass attenuation. No consolidative process, nodule or mass. Mild dependent atelectasis is noted. Upper Abdomen: Cystic lesion in the mid abdomen presumably is within the left lobe of the liver and measures 12 cm transverse by 8 cm AP. It is incompletely imaged. A second smaller hepatic cyst measuring 2.6 cm is noted. Parapelvic renal cysts on the left versus hydronephrosis noted. Musculoskeletal: Mild compression fracture  deformities of T3 and T4 identified. Somewhat larger compression fracture of T12 is identified. Severe compression fracture of L1 is partially imaged. No lytic lesion is identified. Review of the MIP images confirms the above findings. IMPRESSION: Negative for pulmonary embolus. Findings consistent with congestive heart failure with associated small pleural effusions and marked cardiomegaly. Partial visualization of the left kidney demonstrates parapelvic cysts versus hydronephrosis. If indicated, this could be further evaluated with ultrasound of the kidneys. Large cystic lesion in the upper abdomen is likely within the liver but incompletely visualized. T3, T4, T12 and L1 compression fractures appear chronic but cannot be definitively characterized. Most severe compression fracture is at L1 and incompletely imaged. Aortic Atherosclerosis (ICD10-I70.0). Electronically Signed   By: Drusilla Kanner M.D.   On: 01/02/2019 18:06   Dg Chest Portable 1 View  Result Date: 01/02/2019 CLINICAL DATA:  85 year old female with a history of hypertension EXAM: PORTABLE CHEST 1 VIEW COMPARISON:  12/14/2018 CT 12/14/2018 FINDINGS: Cardiomediastinal silhouette unchanged with cardiomegaly. Fullness in the central vasculature with interlobular septal thickening. No pneumothorax. Blunting of the bilateral costophrenic angles. Meniscus at the right lung base. No pneumothorax. No displaced fracture. Soft tissue density surrounding the trachea, compatible with thyroid goiter which is evident on prior CT imaging. IMPRESSION: Acute CHF, with trace pleural effusions. Electronically Signed   By: Gilmer Mor D.O.   On: 01/02/2019 10:36    Cardiac Studies     Patient Profile     Amy Randall is a 85 y.o. female with a hx of afib and dementia who is being seen today for the evaluation of SOB at the request of Dr. Mariea Clonts  Assessment & Plan    1. Elevated troponin - nonspecific finding in setting of severe HTN at times  in ER, tachycardia, CHF  - due to advanced age and dementia she is not a candidate for ischemic testing - would manage blood pressures, heart rates, and CHF medically.    2. Persistent Afib - long history - has not been on anticoag, I presume due to advanced age, dementia, and falls. I do not see this documented inher chart however and not clear on prior conversations. Chart does report multiple falls over the years, would not start anticoag at this time.   - rates remain elevated, increase dilt to 360mg  daily. Continue lopressor  3. HTN - severe HTN at times in the ER - follow bp response with oral diltiazem already given as well as with diuresis   - trending down but still elevated, increasing diltiazem as reported above   4. Acute diastolic heart failure - 12/2018 echo LVEF 60-65%, cannot determine diastolic function due to afib however severe biatrial enlargement would suggest significant dysfunction - evidence of pulm edema, elevated BNP - CHF likely exacerbated by her severe HTN and afib with elevated rates   -  negative 3.1 L yesterday, downtrend in Cr and BUN consistent with venous congestion and CHF. Received IV lasix 20mg  this AM, change to oral lasix 20mg  daily   Ok for discharge from cardiac standpoint, no further cardiac testing or interventions planned. Further titration of bp and afib meds would be ok as outpatient.     [facility] will sign off.   Medication Recommendations:  Diltiazem 360mg  daily, lopressor 25mg  bid, lasix 20mg  daily Other recommendations (labs, testing, etc):  BMET/Mg in 1 week Follow up as an outpatient:  We will arrange f/u in 2-3 weeks     For questions or updates, please contact [facility] Please consult [email or web address] for contact info under        Signed, Dina RichBranch, Devinne Epstein, MD  01/03/2019, 8:30 AM

## 2019-01-03 NOTE — TOC Progression Note (Signed)
Transition of Care [facility]) - Progression Note    Patient Details  Name: Amy Randall MRN: [id number] Date of Birth: October 05, 1933  Transition of Care [facility] CM/SW Contact  Leitha Bleak, RN Phone Number: 01/03/2019, 2:24 PM  Clinical Narrative:   Patient in observation for CHF, pt oriented to self, sitter at the bedside, sleeping all day. Per daughter she will sleep til 1pm then up. [name] the daughter wants to make sure her Home care services are extented.  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 SW.     Expected Discharge Plan: Home w Home Health Services Barriers to Discharge: No Barriers Identified  Expected Discharge Plan and Services Expected Discharge Plan: Home w Home Health Services   Discharge Planning Services: CM Consult   Living arrangements for the past 2 months: Single Family Home                             HH Agency: [facility]         Social Determinants of Health (SDOH) Interventions    Readmission Risk Interventions No flowsheet data found.

## 2019-01-04 DIAGNOSIS — S72141D Displaced intertrochanteric fracture of right femur, subsequent encounter for closed fracture with routine healing: Secondary | ICD-10-CM | POA: Diagnosis not present

## 2019-01-04 DIAGNOSIS — D62 Acute posthemorrhagic anemia: Secondary | ICD-10-CM | POA: Diagnosis not present

## 2019-01-04 DIAGNOSIS — R739 Hyperglycemia, unspecified: Secondary | ICD-10-CM | POA: Diagnosis not present

## 2019-01-04 DIAGNOSIS — I480 Paroxysmal atrial fibrillation: Secondary | ICD-10-CM | POA: Diagnosis not present

## 2019-01-04 DIAGNOSIS — E43 Unspecified severe protein-calorie malnutrition: Secondary | ICD-10-CM | POA: Diagnosis not present

## 2019-01-04 DIAGNOSIS — I119 Hypertensive heart disease without heart failure: Secondary | ICD-10-CM | POA: Diagnosis not present

## 2019-01-04 MED ORDER — FUROSEMIDE 20 MG PO TABS: 20.0000 mg | ORAL_TABLET | Freq: Every day | ORAL | 12 refills | Status: DC

## 2019-01-04 MED ORDER — DILTIAZEM HCL ER COATED BEADS 360 MG PO CP24: 360.0000 mg | ORAL_CAPSULE | Freq: Every day | ORAL | 12 refills | Status: AC

## 2019-01-04 MED ORDER — ASPIRIN 81 MG PO CHEW: 81.0000 mg | CHEWABLE_TABLET | Freq: Every day | ORAL | 12 refills | Status: DC

## 2019-01-04 MED ORDER — METOPROLOL TARTRATE 25 MG PO TABS: 25.0000 mg | ORAL_TABLET | Freq: Two times a day (BID) | ORAL | 12 refills | Status: AC

## 2019-01-04 NOTE — Progress Notes (Signed)
Discharge instructions reviewed with patient's daughter.  Daughter verbalized understanding of instructions.

## 2019-01-04 NOTE — Progress Notes (Signed)
Safety sitter discontinued as pt is easily re-directed throughout night which was minimal. If needed would advise tele-sitter.

## 2019-01-04 NOTE — TOC Transition Note (Signed)
Transition of Care [facility]) - CM/SW Discharge Note   Patient Details  Name: Amy Randall MRN: [id number] Date of Birth: 1934-06-29  Transition of Care [facility]) CM/SW Contact:  Virgel Manifold, RN Phone Number: 01/04/2019, 10:40 AM   Clinical Narrative:  Patient to be discharged per MD order. Orders in place for home health services. Patient was previously set up with [facility]. Notified Cory of discharge. No DME needs.      Final next level of care: Home w Home Health Services Barriers to Discharge: No Barriers Identified   Patient Goals and CMS Choice Patient states their goals for this hospitalization and ongoing recovery are:: To go home with Home health.  CMS Medicare.gov Compare Post Acute Care list provided to:: Patient Choice offered to / list presented to : Adult Children  Discharge Placement                       Discharge Plan and Services   Discharge Planning Services: CM Consult                      HH Arranged: RN, PT [facility] Agency: [facility] Date [facility] Agency Contacted: 01/04/19 Time HH Agency Contacted: 1040 Representative spoke with at [facility] Agency: cory  Social Determinants of Health (SDOH) Interventions     Readmission Risk Interventions Readmission Risk Prevention Plan 01/04/2019  Transportation Screening Complete  PCP or Specialist Appt within 5-7 Days Complete  Home Care Screening Complete  Medication Review (RN CM) Complete  Some recent data might be hidden

## 2019-01-04 NOTE — Discharge Summary (Signed)
Physician Discharge Summary  Patient ID: Amy Randall MRN: [id number] DOB/AGE: 83-Jan-1935 83 y.o. Primary Care Physician:Micheil Klaus, Ramon Dredge, MD Admit date: 01/02/2019 Discharge date: 01/04/2019    Discharge Diagnoses:   Principal Problem:   Acute CHF (congestive heart failure) (HCC) Active Problems:   Essential hypertension   Dementia without behavioral disturbance (HCC)   AF (paroxysmal atrial fibrillation) (HCC)   Tachycardia Recent hip fracture  Allergies as of 01/04/2019      Reactions   Bactrim [sulfamethoxazole-trimethoprim] Other (See Comments)   Loss of memory   Ibuprofen Other (See Comments)   Causes bruising   Orange Fruit [citrus]    Per patient's daughter, orange snesitivity   Penicillins Rash   Has patient had a PCN reaction causing immediate rash, facial/tongue/throat swelling, SOB or lightheadedness with hypotension: Yes Has patient had a PCN reaction causing severe rash involving mucus membranes or skin necrosis: No Has patient had a PCN reaction that required hospitalization: No Has patient had a PCN reaction occurring within the last 10 years: No If all of the above answers are "NO", then may proceed with Cephalosporin use.      Medication List    STOP taking these medications   aspirin 325 MG EC tablet Replaced by:  aspirin 81 MG chewable tablet     TAKE these medications   aspirin 81 MG chewable tablet Chew 1 tablet (81 mg total) by mouth daily. Replaces:  aspirin 325 MG EC tablet   diltiazem 360 MG 24 hr capsule Commonly known as:  CARDIZEM CD Take 1 capsule (360 mg total) by mouth daily.   docusate sodium 100 MG capsule Commonly known as:  COLACE Take 1 capsule (100 mg total) by mouth 2 (two) times daily.   furosemide 20 MG tablet Commonly known as:  LASIX Take 1 tablet (20 mg total) by mouth daily.   metoprolol tartrate 25 MG tablet Commonly known as:  LOPRESSOR Take 1 tablet (25 mg total) by mouth 2 (two) times daily.        Discharged Condition: Improved    Consults: Cardiology, Dr. Wyline Mood  Significant Diagnostic Studies: Dg Elbow 2 Views Left  Result Date: 12/14/2018 CLINICAL DATA:  Fall. Elbow laceration. EXAM: LEFT ELBOW - 2 VIEW COMPARISON:  None. FINDINGS: An IV catheter is noted in the antecubital region. Elevation of the anterior fat pad is compatible with a small elbow joint effusion. No acute fracture or dislocation is identified. IMPRESSION: Small elbow joint effusion without an acute fracture evident. Electronically Signed   By: Sebastian Ache M.D.   On: 12/14/2018 08:29   Ct Head Wo Contrast  Result Date: 12/14/2018 CLINICAL DATA:  Found on ground this morning. EXAM: CT HEAD WITHOUT CONTRAST CT CERVICAL SPINE WITHOUT CONTRAST TECHNIQUE: Multidetector CT imaging of the head and cervical spine was performed following the standard protocol without intravenous contrast. Multiplanar CT image reconstructions of the cervical spine were also generated. COMPARISON:  None. FINDINGS: CT HEAD FINDINGS Brain: There is no evidence of acute infarct, intracranial hemorrhage, mass, midline shift, or extra-axial fluid collection. Small chronic infarcts are present in the bilateral basal ganglia, left cerebellum, and potentially thalami. Confluent cerebral white matter hypodensities are nonspecific but compatible with moderate to severe chronic small vessel ischemic disease. There is moderately advanced cerebral atrophy. Vascular: Calcified atherosclerosis at the skull base. No hyperdense vessel. Skull: No fracture or focal osseous lesion. Sinuses/Orbits: Visualized paranasal sinuses and mastoid air cells are clear. Visualized orbits are unremarkable. Other: None. CT CERVICAL SPINE FINDINGS Alignment:  Mild reversal of the normal cervical lordosis. Minimal anterolisthesis of C2 on C3, C4 on C5, and C7 on T1. Skull base and vertebrae: No acute fracture or suspicious osseous lesion. Mild C1-2 arthropathy. Soft tissues and spinal  canal: No prevertebral fluid or swelling. No visible canal hematoma. Disc levels: Disc degeneration most advanced at C3-4 where there is degenerative interbody ankylosis. Moderately advanced disc degeneration at C6-7. Moderate cervical facet arthrosis. Moderate left neural foraminal stenosis at C5-6 and C6-7. Upper chest: Clear lung apices. Other: Moderate enlargement of the left greater than right thyroid lobes with multiple underlying nodules and with left-sided thyroid extension into the superior mediastinum, incompletely imaged. Associated rightward tracheal deviation without significant airway narrowing. Mild calcified atherosclerosis at the carotid bifurcations. IMPRESSION: 1. No evidence of acute intracranial abnormality. 2. Moderate to severe chronic small vessel ischemic disease. 3. No evidence of acute cervical spine fracture. 4. Multinodular thyroid goiter. Electronically Signed   By: Sebastian AcheAllen  Grady M.D.   On: 12/14/2018 08:19   Ct Angio Chest Pe W And/or Wo Contrast  Result Date: 01/02/2019 CLINICAL DATA:  Shortness of breath today. Patient status post repair of a hip fracture 12/14/2018. EXAM: CT ANGIOGRAPHY CHEST WITH CONTRAST TECHNIQUE: Multidetector CT imaging of the chest was performed using the standard protocol during bolus administration of intravenous contrast. Multiplanar CT image reconstructions and MIPs were obtained to evaluate the vascular anatomy. CONTRAST:  75 mL OMNIPAQUE IOHEXOL 350 MG/ML SOLN COMPARISON:  Single-view of the chest earlier today. FINDINGS: Cardiovascular: No pulmonary embolus is identified. Calcific aortic and coronary atherosclerosis is noted. There is marked cardiomegaly. No pericardial effusion. Mediastinum/Nodes: Thyromegaly is worse in the left lobe of the thyroid which extends into the mediastinum. No focal lesion is identified. No lymphadenopathy. The esophagus is unremarkable. Lungs/Pleura: Small bilateral pleural effusions are present. There is interlobular  septal thickening and scattered ground-glass attenuation. No consolidative process, nodule or mass. Mild dependent atelectasis is noted. Upper Abdomen: Cystic lesion in the mid abdomen presumably is within the left lobe of the liver and measures 12 cm transverse by 8 cm AP. It is incompletely imaged. A second smaller hepatic cyst measuring 2.6 cm is noted. Parapelvic renal cysts on the left versus hydronephrosis noted. Musculoskeletal: Mild compression fracture deformities of T3 and T4 identified. Somewhat larger compression fracture of T12 is identified. Severe compression fracture of L1 is partially imaged. No lytic lesion is identified. Review of the MIP images confirms the above findings. IMPRESSION: Negative for pulmonary embolus. Findings consistent with congestive heart failure with associated small pleural effusions and marked cardiomegaly. Partial visualization of the left kidney demonstrates parapelvic cysts versus hydronephrosis. If indicated, this could be further evaluated with ultrasound of the kidneys. Large cystic lesion in the upper abdomen is likely within the liver but incompletely visualized. T3, T4, T12 and L1 compression fractures appear chronic but cannot be definitively characterized. Most severe compression fracture is at L1 and incompletely imaged. Aortic Atherosclerosis (ICD10-I70.0). Electronically Signed   By: Drusilla Kannerhomas  Dalessio M.D.   On: 01/02/2019 18:06   Ct Cervical Spine Wo Contrast  Result Date: 12/14/2018 CLINICAL DATA:  Found on ground this morning. EXAM: CT HEAD WITHOUT CONTRAST CT CERVICAL SPINE WITHOUT CONTRAST TECHNIQUE: Multidetector CT imaging of the head and cervical spine was performed following the standard protocol without intravenous contrast. Multiplanar CT image reconstructions of the cervical spine were also generated. COMPARISON:  None. FINDINGS: CT HEAD FINDINGS Brain: There is no evidence of acute infarct, intracranial hemorrhage, mass, midline shift, or  extra-axial fluid collection. Small chronic infarcts are present in the bilateral basal ganglia, left cerebellum, and potentially thalami. Confluent cerebral white matter hypodensities are nonspecific but compatible with moderate to severe chronic small vessel ischemic disease. There is moderately advanced cerebral atrophy. Vascular: Calcified atherosclerosis at the skull base. No hyperdense vessel. Skull: No fracture or focal osseous lesion. Sinuses/Orbits: Visualized paranasal sinuses and mastoid air cells are clear. Visualized orbits are unremarkable. Other: None. CT CERVICAL SPINE FINDINGS Alignment: Mild reversal of the normal cervical lordosis. Minimal anterolisthesis of C2 on C3, C4 on C5, and C7 on T1. Skull base and vertebrae: No acute fracture or suspicious osseous lesion. Mild C1-2 arthropathy. Soft tissues and spinal canal: No prevertebral fluid or swelling. No visible canal hematoma. Disc levels: Disc degeneration most advanced at C3-4 where there is degenerative interbody ankylosis. Moderately advanced disc degeneration at C6-7. Moderate cervical facet arthrosis. Moderate left neural foraminal stenosis at C5-6 and C6-7. Upper chest: Clear lung apices. Other: Moderate enlargement of the left greater than right thyroid lobes with multiple underlying nodules and with left-sided thyroid extension into the superior mediastinum, incompletely imaged. Associated rightward tracheal deviation without significant airway narrowing. Mild calcified atherosclerosis at the carotid bifurcations. IMPRESSION: 1. No evidence of acute intracranial abnormality. 2. Moderate to severe chronic small vessel ischemic disease. 3. No evidence of acute cervical spine fracture. 4. Multinodular thyroid goiter. Electronically Signed   By: Sebastian Ache M.D.   On: 12/14/2018 08:19   Dg Pelvis Portable  Result Date: 12/14/2018 CLINICAL DATA:  Unwitnessed fall.  Right hip deformity. EXAM: PORTABLE PELVIS 1-2 VIEWS COMPARISON:  None.  FINDINGS: Severely angulated and displaced fracture is seen involving the intertrochanteric region of the proximal right femur. Left hip appears normal. IMPRESSION: Severely angulated and displaced intertrochanteric fracture of proximal right femur. Electronically Signed   By: Lupita Raider M.D.   On: 12/14/2018 08:15   Dg Chest Portable 1 View  Result Date: 01/02/2019 CLINICAL DATA:  85 year old female with a history of hypertension EXAM: PORTABLE CHEST 1 VIEW COMPARISON:  12/14/2018 CT 12/14/2018 FINDINGS: Cardiomediastinal silhouette unchanged with cardiomegaly. Fullness in the central vasculature with interlobular septal thickening. No pneumothorax. Blunting of the bilateral costophrenic angles. Meniscus at the right lung base. No pneumothorax. No displaced fracture. Soft tissue density surrounding the trachea, compatible with thyroid goiter which is evident on prior CT imaging. IMPRESSION: Acute CHF, with trace pleural effusions. Electronically Signed   By: Gilmer Mor D.O.   On: 01/02/2019 10:36   Dg Chest Portable 1 View  Result Date: 12/14/2018 CLINICAL DATA:  Unwitnessed fall. EXAM: PORTABLE CHEST 1 VIEW COMPARISON:  None. FINDINGS: Mild cardiomegaly is noted. No pneumothorax or pleural effusion is noted. Mild bibasilar subsegmental atelectasis or infiltrates are noted. The visualized skeletal structures are unremarkable. IMPRESSION: Mild bibasilar subsegmental atelectasis or infiltrates. Electronically Signed   By: Lupita Raider M.D.   On: 12/14/2018 08:14   Dg C-arm 1-60 Min  Result Date: 12/14/2018 CLINICAL DATA:  Right femur intertrochanteric fracture, operative fixation EXAM: OPERATIVE RIGHT HIP (WITH PELVIS IF PERFORMED) 4 VIEWS TECHNIQUE: Fluoroscopic spot image(s) were submitted for interpretation post-operatively. COMPARISON:  12/14/2018 FINDINGS: Spot fluoroscopic intraoperative views demonstrate screw and rod fixation of the right hip intertrochanteric fracture. Fracture lines  remain visible. Improved alignment. No complicating feature or hardware abnormality. Peripheral atherosclerosis noted. Bones are osteopenic. IMPRESSION: Status post ORIF of the right hip intertrochanteric fracture with improved alignment. Electronically Signed   By: Judie Petit.  Shick M.D.   On:  12/14/2018 14:39   Dg Hip Operative Unilat With Pelvis Right  Result Date: 12/14/2018 CLINICAL DATA:  Right femur intertrochanteric fracture, operative fixation EXAM: OPERATIVE RIGHT HIP (WITH PELVIS IF PERFORMED) 4 VIEWS TECHNIQUE: Fluoroscopic spot image(s) were submitted for interpretation post-operatively. COMPARISON:  12/14/2018 FINDINGS: Spot fluoroscopic intraoperative views demonstrate screw and rod fixation of the right hip intertrochanteric fracture. Fracture lines remain visible. Improved alignment. No complicating feature or hardware abnormality. Peripheral atherosclerosis noted. Bones are osteopenic. IMPRESSION: Status post ORIF of the right hip intertrochanteric fracture with improved alignment. Electronically Signed   By: Judie Petit.  Shick M.D.   On: 12/14/2018 14:39   Dg Femur Min 2 Views Right  Result Date: 12/14/2018 CLINICAL DATA:  Femur fracture. EXAM: RIGHT FEMUR 2 VIEWS COMPARISON:  None. FINDINGS: Severely displaced and comminuted fracture is seen involving the intertrochanteric region of the proximal right femur. Vascular calcifications are noted. Severe degenerative joint disease of the right knee is noted. IMPRESSION: Comminuted and severely displaced intertrochanteric fracture of proximal right femur is noted. Electronically Signed   By: Lupita Raider M.D.   On: 12/14/2018 08:21    Lab Results: Basic Metabolic Panel: Recent Labs    01/02/19 1101 01/03/19 0527  NA 141 141  K 3.9 3.5  CL 105 104  CO2 24 28  GLUCOSE 108* 91  BUN 25* 20  CREATININE 0.87 0.74  CALCIUM 8.7* 8.4*   Liver Function Tests: No results for input(s): AST, ALT, ALKPHOS, BILITOT, PROT, ALBUMIN in the last 72  hours.   CBC: Recent Labs    01/02/19 1101 01/03/19 0527  WBC 7.5 6.0  NEUTROABS 6.0  --   HGB 9.2* 8.9*  HCT 30.0* 28.4*  MCV 97.1 96.6  PLT 256 240    Recent Results (from the past 240 hour(s))  SARS Coronavirus 2 (CEPHEID - Performed in [facility] hospital lab), Hosp Order     Status: None   Collection Time: 01/02/19 12:40 PM  Result Value Ref Range Status   SARS Coronavirus 2 NEGATIVE NEGATIVE Final    Comment: (NOTE) If result is NEGATIVE SARS-CoV-2 target nucleic acids are NOT DETECTED. The SARS-CoV-2 RNA is generally detectable in upper and lower  respiratory specimens during the acute phase of infection. The lowest  concentration of SARS-CoV-2 viral copies this assay can detect is 250  copies / mL. A negative result does not preclude SARS-CoV-2 infection  and should not be used as the sole basis for treatment or other  patient management decisions.  A negative result may occur with  improper specimen collection / handling, submission of specimen other  than nasopharyngeal swab, presence of viral mutation(s) within the  areas targeted by this assay, and inadequate number of viral copies  (<250 copies / mL). A negative result must be combined with clinical  observations, patient history, and epidemiological information. If result is POSITIVE SARS-CoV-2 target nucleic acids are DETECTED. The SARS-CoV-2 RNA is generally detectable in upper and lower  respiratory specimens dur ing the acute phase of infection.  Positive  results are indicative of active infection with SARS-CoV-2.  Clinical  correlation with patient history and other diagnostic information is  necessary to determine patient infection status.  Positive results do  not rule out bacterial infection or co-infection with other viruses. If result is PRESUMPTIVE POSTIVE SARS-CoV-2 nucleic acids MAY BE PRESENT.   A presumptive positive result was obtained on the submitted specimen  and confirmed on repeat  testing.  While 2019 novel coronavirus  (SARS-CoV-2)  nucleic acids may be present in the submitted sample  additional confirmatory testing may be necessary for epidemiological  and / or clinical management purposes  to differentiate between  SARS-CoV-2 and other Sarbecovirus currently known to infect humans.  If clinically indicated additional testing with an alternate test  methodology [phone]) is advised. The SARS-CoV-2 RNA is generally  detectable in upper and lower respiratory sp ecimens during the acute  phase of infection. The expected result is Negative. Fact Sheet for Patients:  BoilerBrush.com.cy Fact Sheet for Healthcare Providers: https://pope.com/ This test is not yet approved or cleared by the [place] FDA and has been authorized for detection and/or diagnosis of SARS-CoV-2 by FDA under an Emergency Use Authorization (EUA).  This EUA will remain in effect (meaning this test can be used) for the duration of the COVID-19 declaration under Section 564(b)(1) of the Act, 21 U.S.C. section 360bbb-3(b)(1), unless the authorization is terminated or revoked sooner. Performed at [facility], [address], [place], [place] [address]      Hospital Course: This is an 85 year old who had a hip fracture earlier this month.  She was treated and allowed to go home.  She is had home health services.  She had done well initially then she developed increased shortness of breath.  She was brought to the emergency department where she was found to have atrial fib severe hypertension and heart failure.  She had echocardiogram that showed good systolic function and indeterminate diastolic dysfunction but it was felt that she had diastolic heart failure considering her clinical situation her atrial fib and the fact that her systolic function was normal.  She was treated and improved.  She had increased confusion that delayed her discharge by 24  hours but she was back to her baseline by the time of discharge.  Discharge Exam: Blood pressure 127/74, pulse 70, temperature 98.2 F (36.8 C), temperature source Oral, resp. rate 17, height  (1.549 m), weight 45.4 kg, SpO2 99 %. She is awake and alert.  She is confused.  Her lungs are clear.  Disposition: Home with home health services she will need ambulance transport home  Discharge Instructions    Face-to-face encounter (required for Medicare/Medicaid patients)   Complete by:  As directed    I Fredirick Maudlin certify that this patient is under my care and that I, or a nurse practitioner or physician's assistant working with me, had a face-to-face encounter that meets the physician face-to-face encounter requirements with this patient on 01/04/2019. The encounter with the patient was in whole, or in part for the following medical condition(s) which is the primary reason for home health care (List medical condition): chf   The encounter with the patient was in whole, or in part, for the following medical condition, which is the primary reason for home health care:  chf   I certify that, based on my findings, the following services are medically necessary home health services:   Nursing Physical therapy     Reason for Medically Necessary Home Health Services:  Skilled Nursing- Change/Decline in Patient Status   My clinical findings support the need for the above services:  Unable to leave home safely without assistance and/or assistive device   Further, I certify that my clinical findings support that this patient is homebound due to:  Unable to leave home safely without assistance   Home Health   Complete by:  As directed    To provide the following care/treatments:   PT  RN        Follow-up Information    Ellsworth Lennox, PA-C Follow up on 01/16/2019.   Specialties:  Physician Assistant, Cardiology Why:  Vritual Office Visit for Cardiology Follow-up on 01/16/2019 at 2:00 PM.  Will either be a phone or video visit.  Contact information: [address] [place] [address] [phone]           Signed: Fredirick Maudlin   01/04/2019, 9:09 AM

## 2019-01-04 NOTE — Progress Notes (Signed)
She is much improved.  She is much more alert.  She is confused but not agitated.  She has no complaints but her history is not reliable.  Exam shows that her lungs are clear.  She does not really have any edema now.  Heart is still irregular  Assessment is that she has been admitted with atrial fib but she is better.  She was very confused yesterday but that is better.  Plan is for discharge home today with home health services

## 2019-01-06 ENCOUNTER — Other Ambulatory Visit: Payer: Self-pay

## 2019-01-06 DIAGNOSIS — I480 Paroxysmal atrial fibrillation: Secondary | ICD-10-CM | POA: Diagnosis not present

## 2019-01-06 DIAGNOSIS — S72141D Displaced intertrochanteric fracture of right femur, subsequent encounter for closed fracture with routine healing: Secondary | ICD-10-CM | POA: Diagnosis not present

## 2019-01-06 DIAGNOSIS — I5021 Acute systolic (congestive) heart failure: Secondary | ICD-10-CM

## 2019-01-06 DIAGNOSIS — D62 Acute posthemorrhagic anemia: Secondary | ICD-10-CM | POA: Diagnosis not present

## 2019-01-06 DIAGNOSIS — I119 Hypertensive heart disease without heart failure: Secondary | ICD-10-CM | POA: Diagnosis not present

## 2019-01-06 DIAGNOSIS — E43 Unspecified severe protein-calorie malnutrition: Secondary | ICD-10-CM | POA: Diagnosis not present

## 2019-01-06 DIAGNOSIS — R739 Hyperglycemia, unspecified: Secondary | ICD-10-CM | POA: Diagnosis not present

## 2019-01-07 ENCOUNTER — Other Ambulatory Visit: Payer: Self-pay

## 2019-01-07 NOTE — Patient Outreach (Signed)
[facility]) Care Management  01/07/2019  Amy Randall 1934-06-11 [id number]  Referral received from [facility] Liaison for complex case management.  Unsuccessful outreach attempt. Per automated voice prompt, caller is not accepting calls. No option to leave a voice message.   PLAN -Will route unsuccessful outreach letter. -Will follow up within 3-4 business days.   Katha Cabal [facility] Community Care Manager [phone]

## 2019-01-09 DIAGNOSIS — E43 Unspecified severe protein-calorie malnutrition: Secondary | ICD-10-CM | POA: Diagnosis not present

## 2019-01-09 DIAGNOSIS — I480 Paroxysmal atrial fibrillation: Secondary | ICD-10-CM | POA: Diagnosis not present

## 2019-01-09 DIAGNOSIS — R739 Hyperglycemia, unspecified: Secondary | ICD-10-CM | POA: Diagnosis not present

## 2019-01-09 DIAGNOSIS — S72141D Displaced intertrochanteric fracture of right femur, subsequent encounter for closed fracture with routine healing: Secondary | ICD-10-CM | POA: Diagnosis not present

## 2019-01-09 DIAGNOSIS — D62 Acute posthemorrhagic anemia: Secondary | ICD-10-CM | POA: Diagnosis not present

## 2019-01-09 DIAGNOSIS — I119 Hypertensive heart disease without heart failure: Secondary | ICD-10-CM | POA: Diagnosis not present

## 2019-01-10 DIAGNOSIS — I119 Hypertensive heart disease without heart failure: Secondary | ICD-10-CM | POA: Diagnosis not present

## 2019-01-10 DIAGNOSIS — E43 Unspecified severe protein-calorie malnutrition: Secondary | ICD-10-CM | POA: Diagnosis not present

## 2019-01-10 DIAGNOSIS — D62 Acute posthemorrhagic anemia: Secondary | ICD-10-CM | POA: Diagnosis not present

## 2019-01-10 DIAGNOSIS — S72141D Displaced intertrochanteric fracture of right femur, subsequent encounter for closed fracture with routine healing: Secondary | ICD-10-CM | POA: Diagnosis not present

## 2019-01-10 DIAGNOSIS — I480 Paroxysmal atrial fibrillation: Secondary | ICD-10-CM | POA: Diagnosis not present

## 2019-01-10 DIAGNOSIS — R739 Hyperglycemia, unspecified: Secondary | ICD-10-CM | POA: Diagnosis not present

## 2019-01-13 ENCOUNTER — Other Ambulatory Visit: Payer: Self-pay

## 2019-01-13 NOTE — Patient Outreach (Signed)
[name]) Care Management  01/13/2019  Amy Randall [birth year] [id number]    Unsuccessful outreach attempt. No option to leave a voice message. Will attempt outreach within 3-4 business days.   [place] [phone]

## 2019-01-14 DIAGNOSIS — R739 Hyperglycemia, unspecified: Secondary | ICD-10-CM | POA: Diagnosis not present

## 2019-01-14 DIAGNOSIS — I480 Paroxysmal atrial fibrillation: Secondary | ICD-10-CM | POA: Diagnosis not present

## 2019-01-14 DIAGNOSIS — D62 Acute posthemorrhagic anemia: Secondary | ICD-10-CM | POA: Diagnosis not present

## 2019-01-14 DIAGNOSIS — E43 Unspecified severe protein-calorie malnutrition: Secondary | ICD-10-CM | POA: Diagnosis not present

## 2019-01-14 DIAGNOSIS — S72141D Displaced intertrochanteric fracture of right femur, subsequent encounter for closed fracture with routine healing: Secondary | ICD-10-CM | POA: Diagnosis not present

## 2019-01-14 DIAGNOSIS — I119 Hypertensive heart disease without heart failure: Secondary | ICD-10-CM | POA: Diagnosis not present

## 2019-01-15 DIAGNOSIS — I10 Essential (primary) hypertension: Secondary | ICD-10-CM | POA: Diagnosis not present

## 2019-01-15 DIAGNOSIS — I2510 Atherosclerotic heart disease of native coronary artery without angina pectoris: Secondary | ICD-10-CM | POA: Diagnosis not present

## 2019-01-15 DIAGNOSIS — I4891 Unspecified atrial fibrillation: Secondary | ICD-10-CM | POA: Diagnosis not present

## 2019-01-15 DIAGNOSIS — G309 Alzheimer's disease, unspecified: Secondary | ICD-10-CM | POA: Diagnosis not present

## 2019-01-16 ENCOUNTER — Telehealth: Payer: Medicare Other | Admitting: Student

## 2019-01-16 ENCOUNTER — Encounter: Payer: Self-pay | Admitting: Student

## 2019-01-16 DIAGNOSIS — E43 Unspecified severe protein-calorie malnutrition: Secondary | ICD-10-CM | POA: Diagnosis not present

## 2019-01-16 DIAGNOSIS — I480 Paroxysmal atrial fibrillation: Secondary | ICD-10-CM | POA: Diagnosis not present

## 2019-01-16 DIAGNOSIS — S72141D Displaced intertrochanteric fracture of right femur, subsequent encounter for closed fracture with routine healing: Secondary | ICD-10-CM | POA: Diagnosis not present

## 2019-01-16 DIAGNOSIS — R739 Hyperglycemia, unspecified: Secondary | ICD-10-CM | POA: Diagnosis not present

## 2019-01-16 DIAGNOSIS — D62 Acute posthemorrhagic anemia: Secondary | ICD-10-CM | POA: Diagnosis not present

## 2019-01-16 DIAGNOSIS — I119 Hypertensive heart disease without heart failure: Secondary | ICD-10-CM | POA: Diagnosis not present

## 2019-01-16 NOTE — Progress Notes (Deleted)
{Choose 1 Note Type (Telehealth Visit or Telephone Visit):[phone]}   Date:  01/16/2019   ID:  Amy Randall, DOB March 27, 1934, MRN [id number]  {Patient Location:[phone]::"Home"} {Provider [phone]::"Home"}  PCP:  Kari BaarsHawkins, Edward, MD  Cardiologist:  Dina RichBranch, Jonathan, MD  Electrophysiologist:  None   Evaluation Performed:  {Choose Visit [phone]::"Follow-Up Visit"}  Chief Complaint:  ***  History of Present Illness:    Amy DingwallVirginia T Randall is a 85 y.o. female with past medical history of paroxysmal atrial fibrillation, HTN, and dementia who presents for a telehealth visit for hospital follow-up.  She was recently admitted to [facility] from 01/02/2019 to 01/04/2019 for evaluation of worsening dyspnea. Troponin values were slightly elevated, peaking at 0.14 during admission which was thought to be secondary to demand ischemia in the setting of her elevated BP. A repeat echocardiogram was obtained and showed a preserved EF of 60 to 65% with severe biatrial enlargement. She diuresed over 3 L with IV Lasix and was transitioned to Lasix 20 mg daily upon discharge. Her Cardizem CD was increased to 360 mg daily for improved rate control and she was continued on Lopressor.  She had not been on anticoagulation prior to admission and this was not initiated in the setting of her advanced age, dementia, and frequent falls.  The patient {does/does not:200015} have symptoms concerning for COVID-19 infection (fever, chills, cough, or new shortness of breath).    Past Medical History:  Diagnosis Date  . Atrial fibrillation (HCC)   . Dementia (HCC)   . Hypertension    Past Surgical History:  Procedure Laterality Date  . INTRAMEDULLARY (IM) NAIL INTERTROCHANTERIC Right 12/14/2018   Procedure: INTRAMEDULLARY (IM) NAIL INTERTROCHANTRIC;  Surgeon: Yolonda Kidaogers, Jason Patrick, MD;  [facility];  Service: Orthopedics;  Laterality: Right;     No outpatient medications have been marked as  taking for the 01/16/19 encounter (Appointment) with Ellsworth LennoxStrader, Jarielys Girardot M, PA-C.     Allergies:   Bactrim [sulfamethoxazole-trimethoprim], Ibuprofen, Orange fruit [citrus], and Penicillins   Social History   Tobacco Use  . Smoking status: Never Smoker  . Smokeless tobacco: Never Used  Substance Use Topics  . Alcohol use: Not on file  . Drug use: Not on file     Family Hx: The patient's family history includes Diabetes in her maternal grandmother; Hypertension in her father; Stroke in her father.  ROS:   Please see the history of present illness.    *** All other systems reviewed and are negative.   Prior CV studies:   The following studies were reviewed today:  ***  Labs/Other Tests and Data Reviewed:    EKG:  {EKG/Telemetry Strips [phone]}  Recent Labs: 01/26/2018: ALT 46 01/02/2019: B Natriuretic Peptide 257.0 01/03/2019: BUN 20; Creatinine, Ser 0.74; Hemoglobin 8.9; Platelets 240; Potassium 3.5; Sodium 141   Recent Lipid Panel No results found for: CHOL, TRIG, HDL, CHOLHDL, LDLCALC, LDLDIRECT  Wt Readings from Last 3 Encounters:  01/02/19 100 lb (45.4 kg)  12/14/18 100 lb 15.5 oz (45.8 kg)  01/26/18 130 lb (59 kg)     Objective:    Vital Signs:  There were no vitals taken for this visit.   {[facility] Virtual Exam (Optional):[phone]::"VITAL SIGNS:  reviewed"}  ASSESSMENT & PLAN:    1. ***  COVID-19 Education: The signs and symptoms of COVID-19 were discussed with the patient and how to seek care for testing (follow up with PCP or arrange E-visit).  ***The importance of social distancing was discussed today.  Time:   Today,  I have spent *** minutes with the patient with telehealth technology discussing the above problems.     Medication Adjustments/Labs and Tests Ordered: Current medicines are reviewed at length with the patient today.  Concerns regarding medicines are outlined above.   Tests Ordered: No orders of the defined types were  placed in this encounter.   Medication Changes: No orders of the defined types were placed in this encounter.   Disposition:  Follow up {follow up:15908}  Signed, Erma Heritage, PA-C  01/16/2019 7:26 AM    [name]

## 2019-01-17 DIAGNOSIS — S72141D Displaced intertrochanteric fracture of right femur, subsequent encounter for closed fracture with routine healing: Secondary | ICD-10-CM | POA: Diagnosis not present

## 2019-01-17 DIAGNOSIS — I4819 Other persistent atrial fibrillation: Secondary | ICD-10-CM | POA: Diagnosis not present

## 2019-01-17 DIAGNOSIS — M8008XD Age-related osteoporosis with current pathological fracture, vertebra(e), subsequent encounter for fracture with routine healing: Secondary | ICD-10-CM | POA: Diagnosis not present

## 2019-01-17 DIAGNOSIS — R739 Hyperglycemia, unspecified: Secondary | ICD-10-CM | POA: Diagnosis not present

## 2019-01-17 DIAGNOSIS — R32 Unspecified urinary incontinence: Secondary | ICD-10-CM | POA: Diagnosis not present

## 2019-01-17 DIAGNOSIS — I110 Hypertensive heart disease with heart failure: Secondary | ICD-10-CM | POA: Diagnosis not present

## 2019-01-17 DIAGNOSIS — M4313 Spondylolisthesis, cervicothoracic region: Secondary | ICD-10-CM | POA: Diagnosis not present

## 2019-01-17 DIAGNOSIS — M5031 Other cervical disc degeneration,  high cervical region: Secondary | ICD-10-CM | POA: Diagnosis not present

## 2019-01-17 DIAGNOSIS — F410 Panic disorder [episodic paroxysmal anxiety] without agoraphobia: Secondary | ICD-10-CM | POA: Diagnosis not present

## 2019-01-17 DIAGNOSIS — Z1159 Encounter for screening for other viral diseases: Secondary | ICD-10-CM | POA: Diagnosis not present

## 2019-01-17 DIAGNOSIS — Z9181 History of falling: Secondary | ICD-10-CM | POA: Diagnosis not present

## 2019-01-17 DIAGNOSIS — D62 Acute posthemorrhagic anemia: Secondary | ICD-10-CM | POA: Diagnosis not present

## 2019-01-17 DIAGNOSIS — I509 Heart failure, unspecified: Secondary | ICD-10-CM | POA: Diagnosis not present

## 2019-01-17 DIAGNOSIS — E042 Nontoxic multinodular goiter: Secondary | ICD-10-CM | POA: Diagnosis not present

## 2019-01-17 DIAGNOSIS — J9811 Atelectasis: Secondary | ICD-10-CM | POA: Diagnosis not present

## 2019-01-17 DIAGNOSIS — Z602 Problems related to living alone: Secondary | ICD-10-CM | POA: Diagnosis not present

## 2019-01-17 DIAGNOSIS — M25442 Effusion, left hand: Secondary | ICD-10-CM | POA: Diagnosis not present

## 2019-01-17 DIAGNOSIS — I2510 Atherosclerotic heart disease of native coronary artery without angina pectoris: Secondary | ICD-10-CM | POA: Diagnosis not present

## 2019-01-17 DIAGNOSIS — N1330 Unspecified hydronephrosis: Secondary | ICD-10-CM | POA: Diagnosis not present

## 2019-01-17 DIAGNOSIS — K835 Biliary cyst: Secondary | ICD-10-CM | POA: Diagnosis not present

## 2019-01-17 DIAGNOSIS — F0391 Unspecified dementia with behavioral disturbance: Secondary | ICD-10-CM | POA: Diagnosis not present

## 2019-01-17 DIAGNOSIS — N281 Cyst of kidney, acquired: Secondary | ICD-10-CM | POA: Diagnosis not present

## 2019-01-17 DIAGNOSIS — I700 Atherosclerosis of aorta: Secondary | ICD-10-CM | POA: Diagnosis not present

## 2019-01-17 DIAGNOSIS — Z8673 Personal history of transient ischemic attack (TIA), and cerebral infarction without residual deficits: Secondary | ICD-10-CM | POA: Diagnosis not present

## 2019-01-17 DIAGNOSIS — E43 Unspecified severe protein-calorie malnutrition: Secondary | ICD-10-CM | POA: Diagnosis not present

## 2019-01-21 DIAGNOSIS — M4313 Spondylolisthesis, cervicothoracic region: Secondary | ICD-10-CM | POA: Diagnosis not present

## 2019-01-21 DIAGNOSIS — M8008XD Age-related osteoporosis with current pathological fracture, vertebra(e), subsequent encounter for fracture with routine healing: Secondary | ICD-10-CM | POA: Diagnosis not present

## 2019-01-21 DIAGNOSIS — M5031 Other cervical disc degeneration,  high cervical region: Secondary | ICD-10-CM | POA: Diagnosis not present

## 2019-01-21 DIAGNOSIS — S72141D Displaced intertrochanteric fracture of right femur, subsequent encounter for closed fracture with routine healing: Secondary | ICD-10-CM | POA: Diagnosis not present

## 2019-01-21 DIAGNOSIS — I509 Heart failure, unspecified: Secondary | ICD-10-CM | POA: Diagnosis not present

## 2019-01-21 DIAGNOSIS — I110 Hypertensive heart disease with heart failure: Secondary | ICD-10-CM | POA: Diagnosis not present

## 2019-01-23 ENCOUNTER — Other Ambulatory Visit: Payer: Self-pay

## 2019-01-23 DIAGNOSIS — S72141D Displaced intertrochanteric fracture of right femur, subsequent encounter for closed fracture with routine healing: Secondary | ICD-10-CM | POA: Diagnosis not present

## 2019-01-23 DIAGNOSIS — I110 Hypertensive heart disease with heart failure: Secondary | ICD-10-CM | POA: Diagnosis not present

## 2019-01-23 DIAGNOSIS — M5031 Other cervical disc degeneration,  high cervical region: Secondary | ICD-10-CM | POA: Diagnosis not present

## 2019-01-23 DIAGNOSIS — M8008XD Age-related osteoporosis with current pathological fracture, vertebra(e), subsequent encounter for fracture with routine healing: Secondary | ICD-10-CM | POA: Diagnosis not present

## 2019-01-23 DIAGNOSIS — M4313 Spondylolisthesis, cervicothoracic region: Secondary | ICD-10-CM | POA: Diagnosis not present

## 2019-01-23 DIAGNOSIS — I509 Heart failure, unspecified: Secondary | ICD-10-CM | POA: Diagnosis not present

## 2019-01-23 NOTE — Patient Outreach (Signed)
[facility]) Care Management  01/23/2019  Amy Randall March 17, 1934 [id number]    Case Closure [facility] case closed. Unable to establish contact with Amy Randall. Will route notification to Dr. Luan Pulling and complete case closure.   [place] [phone]

## 2019-01-28 DIAGNOSIS — I509 Heart failure, unspecified: Secondary | ICD-10-CM | POA: Diagnosis not present

## 2019-01-28 DIAGNOSIS — M5031 Other cervical disc degeneration,  high cervical region: Secondary | ICD-10-CM | POA: Diagnosis not present

## 2019-01-28 DIAGNOSIS — S72141D Displaced intertrochanteric fracture of right femur, subsequent encounter for closed fracture with routine healing: Secondary | ICD-10-CM | POA: Diagnosis not present

## 2019-01-28 DIAGNOSIS — M4313 Spondylolisthesis, cervicothoracic region: Secondary | ICD-10-CM | POA: Diagnosis not present

## 2019-01-28 DIAGNOSIS — M8008XD Age-related osteoporosis with current pathological fracture, vertebra(e), subsequent encounter for fracture with routine healing: Secondary | ICD-10-CM | POA: Diagnosis not present

## 2019-01-28 DIAGNOSIS — I110 Hypertensive heart disease with heart failure: Secondary | ICD-10-CM | POA: Diagnosis not present

## 2019-02-04 DIAGNOSIS — M5031 Other cervical disc degeneration,  high cervical region: Secondary | ICD-10-CM | POA: Diagnosis not present

## 2019-02-04 DIAGNOSIS — S72141D Displaced intertrochanteric fracture of right femur, subsequent encounter for closed fracture with routine healing: Secondary | ICD-10-CM | POA: Diagnosis not present

## 2019-02-04 DIAGNOSIS — I509 Heart failure, unspecified: Secondary | ICD-10-CM | POA: Diagnosis not present

## 2019-02-04 DIAGNOSIS — I110 Hypertensive heart disease with heart failure: Secondary | ICD-10-CM | POA: Diagnosis not present

## 2019-02-04 DIAGNOSIS — M8008XD Age-related osteoporosis with current pathological fracture, vertebra(e), subsequent encounter for fracture with routine healing: Secondary | ICD-10-CM | POA: Diagnosis not present

## 2019-02-04 DIAGNOSIS — M4313 Spondylolisthesis, cervicothoracic region: Secondary | ICD-10-CM | POA: Diagnosis not present

## 2019-02-07 DIAGNOSIS — M5031 Other cervical disc degeneration,  high cervical region: Secondary | ICD-10-CM | POA: Diagnosis not present

## 2019-02-07 DIAGNOSIS — M8008XD Age-related osteoporosis with current pathological fracture, vertebra(e), subsequent encounter for fracture with routine healing: Secondary | ICD-10-CM | POA: Diagnosis not present

## 2019-02-07 DIAGNOSIS — M4313 Spondylolisthesis, cervicothoracic region: Secondary | ICD-10-CM | POA: Diagnosis not present

## 2019-02-07 DIAGNOSIS — I509 Heart failure, unspecified: Secondary | ICD-10-CM | POA: Diagnosis not present

## 2019-02-07 DIAGNOSIS — S72141D Displaced intertrochanteric fracture of right femur, subsequent encounter for closed fracture with routine healing: Secondary | ICD-10-CM | POA: Diagnosis not present

## 2019-02-07 DIAGNOSIS — I110 Hypertensive heart disease with heart failure: Secondary | ICD-10-CM | POA: Diagnosis not present

## 2019-02-11 ENCOUNTER — Telehealth: Payer: Self-pay | Admitting: *Deleted

## 2019-02-11 ENCOUNTER — Other Ambulatory Visit: Payer: Self-pay

## 2019-02-11 DIAGNOSIS — Z20822 Contact with and (suspected) exposure to covid-19: Secondary | ICD-10-CM

## 2019-02-11 DIAGNOSIS — R6889 Other general symptoms and signs: Secondary | ICD-10-CM | POA: Diagnosis not present

## 2019-02-11 NOTE — Telephone Encounter (Signed)
Referred by Dr. Luan Pulling [phone]. [id number] testing appointment made for today at the [facility] site for 2:15p. Informed to wer a mask and stay in vehicle.

## 2019-02-13 DIAGNOSIS — M4313 Spondylolisthesis, cervicothoracic region: Secondary | ICD-10-CM | POA: Diagnosis not present

## 2019-02-13 DIAGNOSIS — I509 Heart failure, unspecified: Secondary | ICD-10-CM | POA: Diagnosis not present

## 2019-02-13 DIAGNOSIS — M8008XD Age-related osteoporosis with current pathological fracture, vertebra(e), subsequent encounter for fracture with routine healing: Secondary | ICD-10-CM | POA: Diagnosis not present

## 2019-02-13 DIAGNOSIS — S72141D Displaced intertrochanteric fracture of right femur, subsequent encounter for closed fracture with routine healing: Secondary | ICD-10-CM | POA: Diagnosis not present

## 2019-02-13 DIAGNOSIS — M5031 Other cervical disc degeneration,  high cervical region: Secondary | ICD-10-CM | POA: Diagnosis not present

## 2019-02-13 DIAGNOSIS — I110 Hypertensive heart disease with heart failure: Secondary | ICD-10-CM | POA: Diagnosis not present

## 2019-02-15 LAB — NOVEL CORONAVIRUS, NAA: SARS-CoV-2, NAA: NOT DETECTED

## 2019-02-19 ENCOUNTER — Telehealth: Payer: Self-pay | Admitting: General Practice

## 2019-02-19 NOTE — Telephone Encounter (Signed)
Pt daughter is calling and her dr Luan Pulling is needing a copy of covid 19 test to be faxed to [phone] and phone number [phone]. Dr Luan Pulling is not on EPIC

## 2019-02-19 NOTE — Telephone Encounter (Signed)
Lab printed and Faxed to Dr. Luan Pulling office. Nothing further is needed

## 2019-03-05 DIAGNOSIS — F0390 Unspecified dementia without behavioral disturbance: Secondary | ICD-10-CM | POA: Diagnosis not present

## 2019-03-05 DIAGNOSIS — E785 Hyperlipidemia, unspecified: Secondary | ICD-10-CM | POA: Diagnosis not present

## 2019-03-05 DIAGNOSIS — I2510 Atherosclerotic heart disease of native coronary artery without angina pectoris: Secondary | ICD-10-CM | POA: Diagnosis not present

## 2019-03-05 DIAGNOSIS — M1990 Unspecified osteoarthritis, unspecified site: Secondary | ICD-10-CM | POA: Diagnosis not present

## 2019-03-05 DIAGNOSIS — I10 Essential (primary) hypertension: Secondary | ICD-10-CM | POA: Diagnosis not present

## 2019-03-05 DIAGNOSIS — G309 Alzheimer's disease, unspecified: Secondary | ICD-10-CM | POA: Diagnosis not present

## 2019-03-10 ENCOUNTER — Other Ambulatory Visit: Payer: Self-pay

## 2019-03-13 DIAGNOSIS — G309 Alzheimer's disease, unspecified: Secondary | ICD-10-CM | POA: Diagnosis not present

## 2019-03-13 DIAGNOSIS — E559 Vitamin D deficiency, unspecified: Secondary | ICD-10-CM | POA: Diagnosis not present

## 2019-03-13 DIAGNOSIS — E785 Hyperlipidemia, unspecified: Secondary | ICD-10-CM | POA: Diagnosis not present

## 2019-03-13 DIAGNOSIS — E119 Type 2 diabetes mellitus without complications: Secondary | ICD-10-CM | POA: Diagnosis not present

## 2019-03-13 DIAGNOSIS — F0390 Unspecified dementia without behavioral disturbance: Secondary | ICD-10-CM | POA: Diagnosis not present

## 2019-03-13 DIAGNOSIS — I10 Essential (primary) hypertension: Secondary | ICD-10-CM | POA: Diagnosis not present

## 2019-03-19 DIAGNOSIS — I10 Essential (primary) hypertension: Secondary | ICD-10-CM | POA: Diagnosis not present

## 2019-03-19 DIAGNOSIS — G309 Alzheimer's disease, unspecified: Secondary | ICD-10-CM | POA: Diagnosis not present

## 2019-03-19 DIAGNOSIS — F0390 Unspecified dementia without behavioral disturbance: Secondary | ICD-10-CM | POA: Diagnosis not present

## 2019-03-19 DIAGNOSIS — R946 Abnormal results of thyroid function studies: Secondary | ICD-10-CM | POA: Diagnosis not present

## 2019-03-19 DIAGNOSIS — E785 Hyperlipidemia, unspecified: Secondary | ICD-10-CM | POA: Diagnosis not present

## 2019-05-28 DIAGNOSIS — G309 Alzheimer's disease, unspecified: Secondary | ICD-10-CM | POA: Diagnosis not present

## 2019-05-28 DIAGNOSIS — I10 Essential (primary) hypertension: Secondary | ICD-10-CM | POA: Diagnosis not present

## 2019-05-28 DIAGNOSIS — E0590 Thyrotoxicosis, unspecified without thyrotoxic crisis or storm: Secondary | ICD-10-CM | POA: Diagnosis not present

## 2019-05-28 DIAGNOSIS — F0390 Unspecified dementia without behavioral disturbance: Secondary | ICD-10-CM | POA: Diagnosis not present

## 2019-06-26 IMAGING — CT CT ANGIOGRAPHY CHEST
2 of 6 series · 18 of 46 positions shown · IV contrast (Isovue)
Comparison: Single-view of the chest earlier today.

CLINICAL DATA: Shortness of breath today. Patient status post
repair of a hip fracture 12/14/2018.

EXAM:
CT ANGIOGRAPHY CHEST WITH CONTRAST
TECHNIQUE: Multidetector CT imaging of the chest was performed using the
standard protocol during bolus administration of intravenous
contrast. Multiplanar CT image reconstructions and MIPs were
obtained to evaluate the vascular anatomy.
CONTRAST:  75 mL OMNIPAQUE IOHEXOL 350 MG/ML SOLN

[Series 10: thins · axial · 0.55mm/px · z∈[+1121,+1346]mm · 15 of 247 slices shown]
[im 11/247  lung]
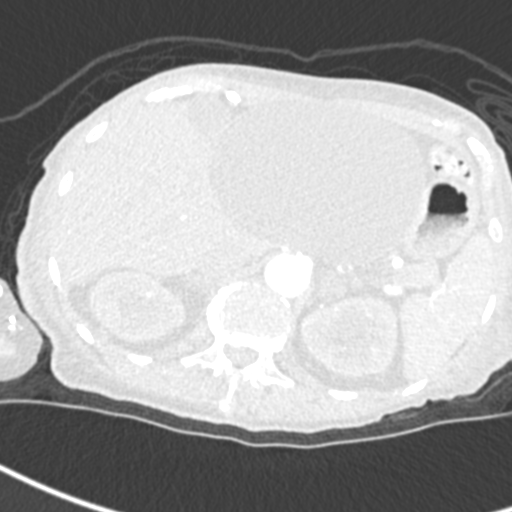
[im 33/247  soft-tissue]
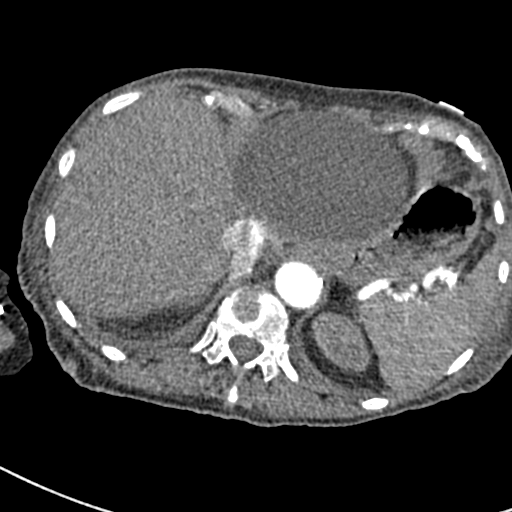
[im 43/247  lung]
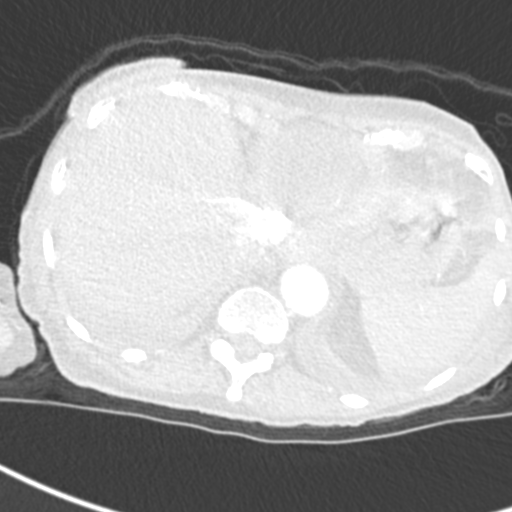
[im 65/247  soft-tissue]
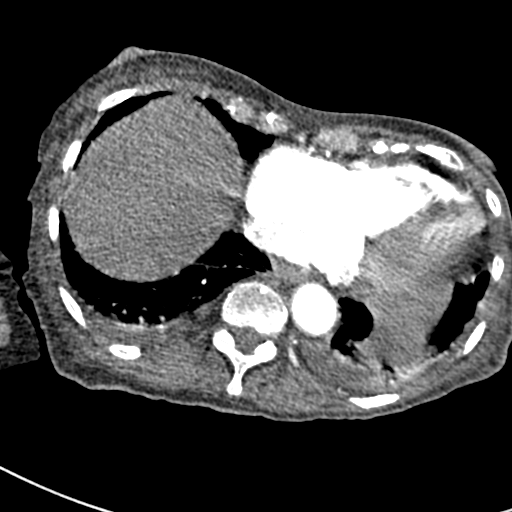
[im 75/247  lung]
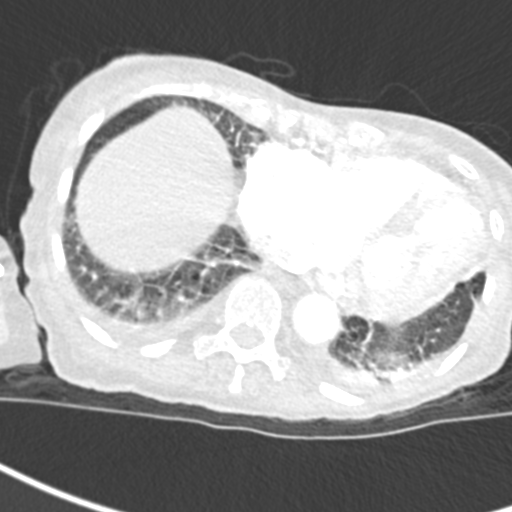
[im 97/247  soft-tissue]
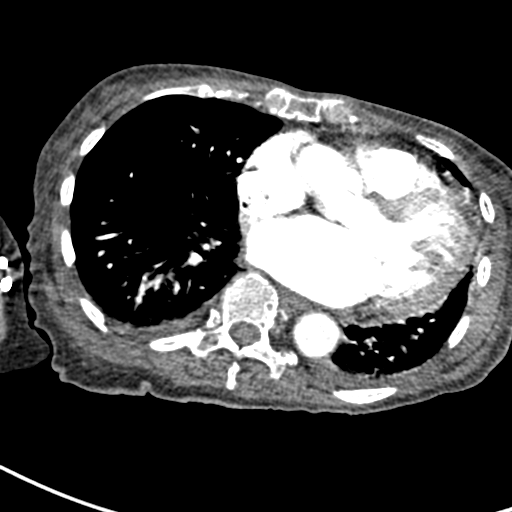
[im 107/247  lung]
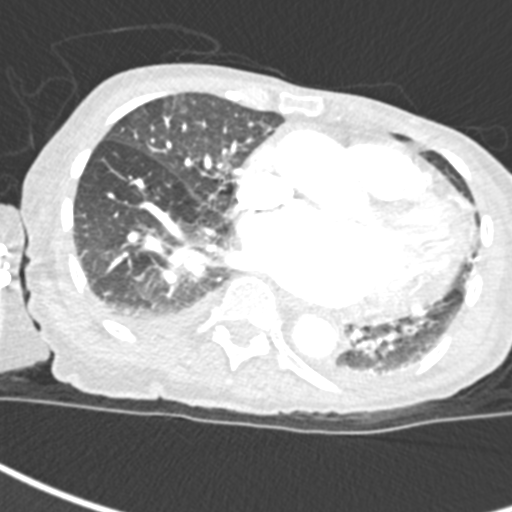
[im 129/247  soft-tissue]
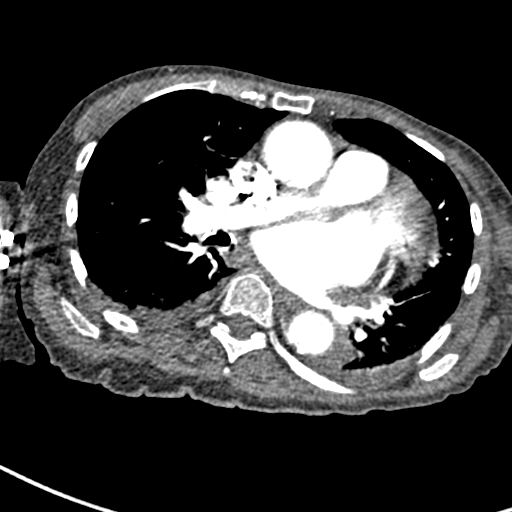
[im 140/247  lung]
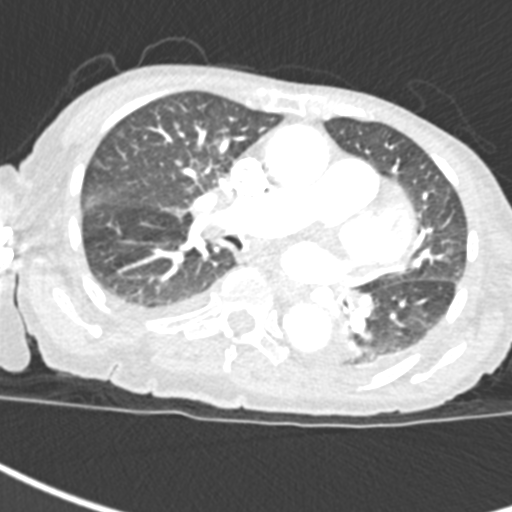
[im 150/247  soft-tissue]
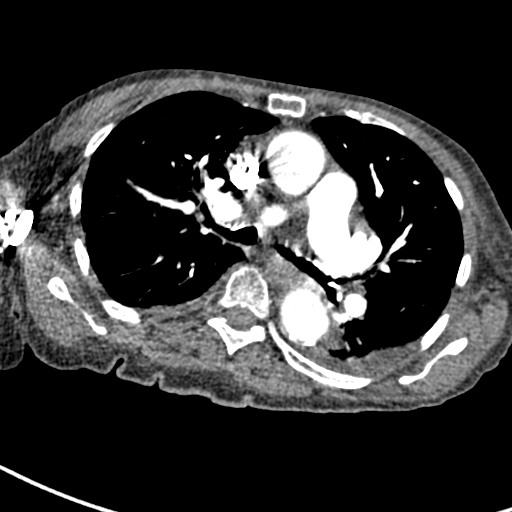
[im 172/247  lung]
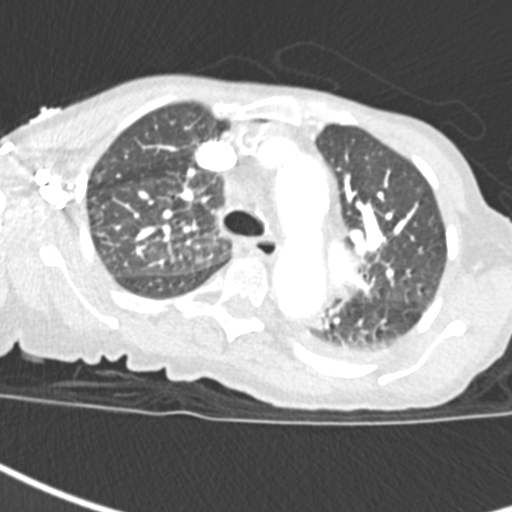
[im 182/247  soft-tissue]
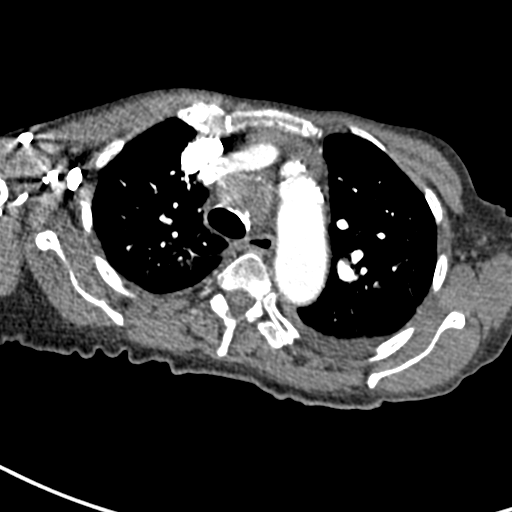
[im 204/247  lung]
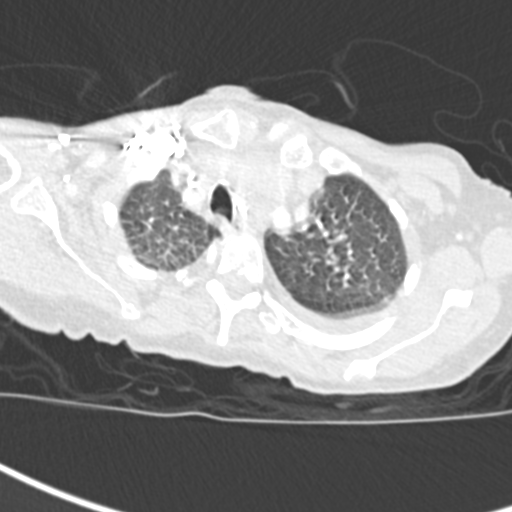
[im 214/247  soft-tissue]
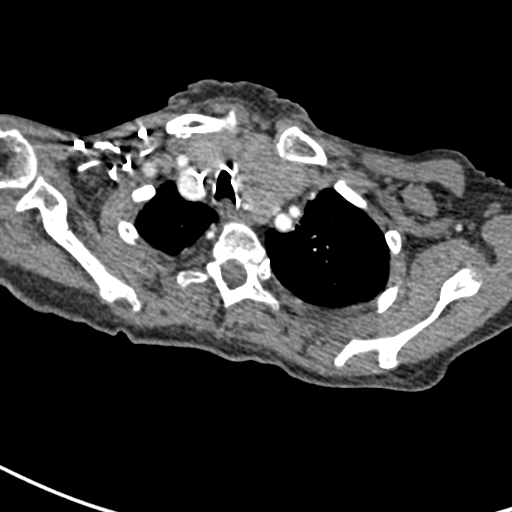
[im 236/247  lung]
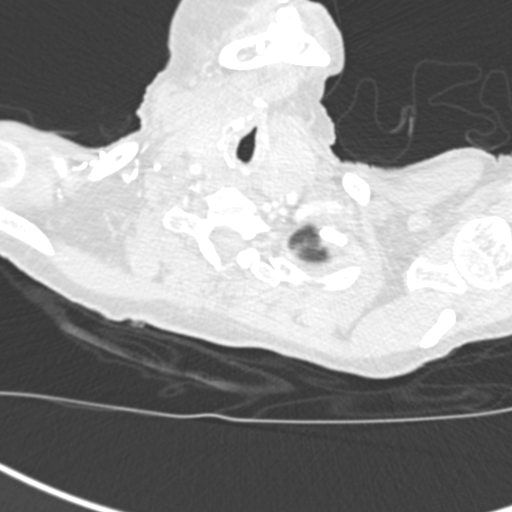

[Series 12: coronal mpr · coronal · 0.50mm/px · 3 of 103 slices shown]
[im 26/103  soft-tissue]
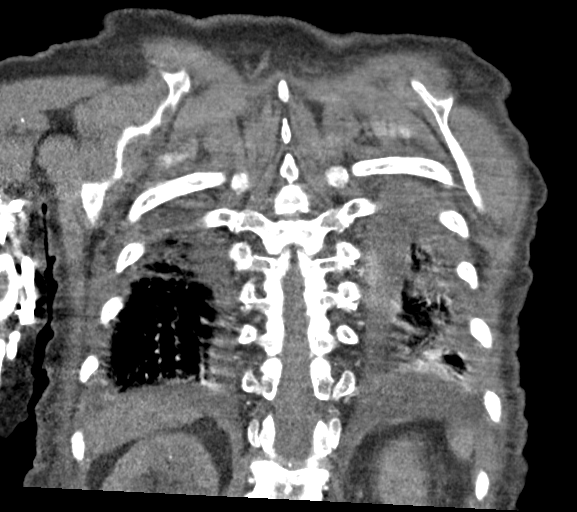
[im 52/103  soft-tissue]
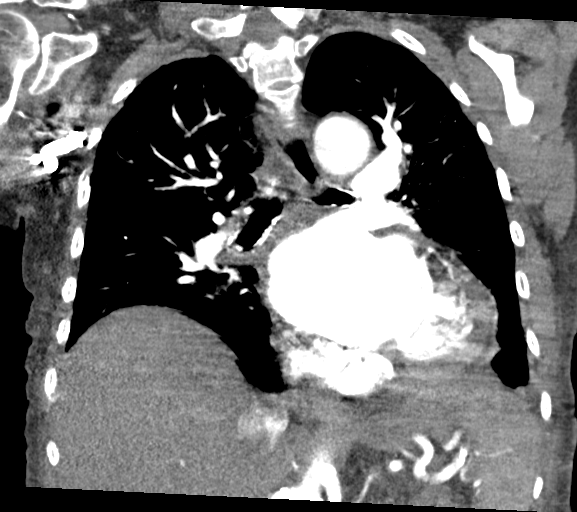
[im 77/103  soft-tissue]
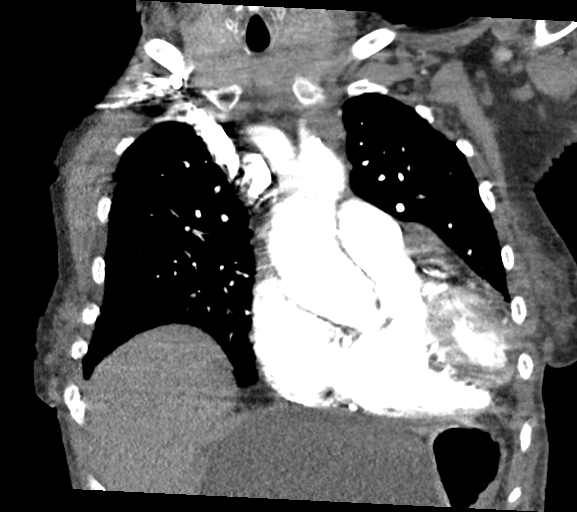

[18 of 46 positions shown; findings below may reference images not displayed]

FINDINGS: Cardiovascular: No pulmonary embolus is identified. Calcific aortic
and coronary atherosclerosis is noted. There is marked cardiomegaly.
No pericardial effusion.

Mediastinum/Nodes: Thyromegaly is worse in the left lobe of the
thyroid which extends into the mediastinum. No focal lesion is
identified. No lymphadenopathy. The esophagus is unremarkable.

Lungs/Pleura: Small bilateral pleural effusions are present. There
is interlobular septal thickening and scattered ground-glass
attenuation. No consolidative process, nodule or mass. Mild
dependent atelectasis is noted.

Upper Abdomen: Cystic lesion in the mid abdomen presumably is within
the left lobe of the liver and measures 12 cm transverse by 8 cm AP.
It is incompletely imaged. A second smaller hepatic cyst measuring
2.6 cm is noted. Parapelvic renal cysts on the left versus
hydronephrosis noted.

Musculoskeletal: Mild compression fracture deformities of T3 and T4
identified. Somewhat larger compression fracture of T12 is
identified. Severe compression fracture of L1 is partially imaged.
No lytic lesion is identified.

Review of the MIP images confirms the above findings.
IMPRESSION: Negative for pulmonary embolus.

Findings consistent with congestive heart failure with associated
small pleural effusions and marked cardiomegaly.

Partial visualization of the left kidney demonstrates parapelvic
cysts versus hydronephrosis. If indicated, this could be further
evaluated with ultrasound of the kidneys.

Large cystic lesion in the upper abdomen is likely within the liver
but incompletely visualized.

T3, T4, T12 and L1 compression fractures appear chronic but cannot
be definitively characterized. Most severe compression fracture is
at L1 and incompletely imaged.

Aortic Atherosclerosis ([id number]).

## 2019-07-09 DIAGNOSIS — I10 Essential (primary) hypertension: Secondary | ICD-10-CM | POA: Diagnosis not present

## 2019-07-09 DIAGNOSIS — E0590 Thyrotoxicosis, unspecified without thyrotoxic crisis or storm: Secondary | ICD-10-CM | POA: Diagnosis not present

## 2019-07-09 DIAGNOSIS — G309 Alzheimer's disease, unspecified: Secondary | ICD-10-CM | POA: Diagnosis not present

## 2019-07-09 DIAGNOSIS — F0390 Unspecified dementia without behavioral disturbance: Secondary | ICD-10-CM | POA: Diagnosis not present

## 2019-07-30 DIAGNOSIS — Z20828 Contact with and (suspected) exposure to other viral communicable diseases: Secondary | ICD-10-CM | POA: Diagnosis not present

## 2019-07-30 DIAGNOSIS — F0390 Unspecified dementia without behavioral disturbance: Secondary | ICD-10-CM | POA: Diagnosis not present

## 2019-07-30 DIAGNOSIS — G309 Alzheimer's disease, unspecified: Secondary | ICD-10-CM | POA: Diagnosis not present

## 2019-07-30 DIAGNOSIS — R110 Nausea: Secondary | ICD-10-CM | POA: Diagnosis not present

## 2019-07-30 DIAGNOSIS — R5383 Other fatigue: Secondary | ICD-10-CM | POA: Diagnosis not present

## 2019-08-01 ENCOUNTER — Emergency Department (HOSPITAL_COMMUNITY): Payer: Medicare Other

## 2019-08-01 ENCOUNTER — Other Ambulatory Visit: Payer: Self-pay

## 2019-08-01 ENCOUNTER — Inpatient Hospital Stay (HOSPITAL_COMMUNITY)
Admission: EM | Admit: 2019-08-01 | Discharge: 2019-08-08 | DRG: 065 | Disposition: A | Discharge status: Skilled Nursing Facility | Payer: Medicare Other | Source: Skilled Nursing Facility | Attending: Family Medicine | Admitting: Family Medicine

## 2019-08-01 ENCOUNTER — Encounter (HOSPITAL_COMMUNITY): Payer: Self-pay | Admitting: Radiology

## 2019-08-01 ENCOUNTER — Inpatient Hospital Stay (HOSPITAL_COMMUNITY): Payer: Medicare Other

## 2019-08-01 DIAGNOSIS — Z7401 Bed confinement status: Secondary | ICD-10-CM | POA: Diagnosis not present

## 2019-08-01 DIAGNOSIS — E876 Hypokalemia: Secondary | ICD-10-CM | POA: Diagnosis not present

## 2019-08-01 DIAGNOSIS — I63511 Cerebral infarction due to unspecified occlusion or stenosis of right middle cerebral artery: Secondary | ICD-10-CM

## 2019-08-01 DIAGNOSIS — I10 Essential (primary) hypertension: Secondary | ICD-10-CM | POA: Diagnosis present

## 2019-08-01 DIAGNOSIS — Z515 Encounter for palliative care: Secondary | ICD-10-CM | POA: Diagnosis not present

## 2019-08-01 DIAGNOSIS — I340 Nonrheumatic mitral (valve) insufficiency: Secondary | ICD-10-CM | POA: Diagnosis not present

## 2019-08-01 DIAGNOSIS — R9431 Abnormal electrocardiogram [ECG] [EKG]: Secondary | ICD-10-CM | POA: Diagnosis present

## 2019-08-01 DIAGNOSIS — Z823 Family history of stroke: Secondary | ICD-10-CM

## 2019-08-01 DIAGNOSIS — Z79899 Other long term (current) drug therapy: Secondary | ICD-10-CM

## 2019-08-01 DIAGNOSIS — W19XXXA Unspecified fall, initial encounter: Secondary | ICD-10-CM | POA: Diagnosis present

## 2019-08-01 DIAGNOSIS — U071 COVID-19: Secondary | ICD-10-CM

## 2019-08-01 DIAGNOSIS — I480 Paroxysmal atrial fibrillation: Secondary | ICD-10-CM

## 2019-08-01 DIAGNOSIS — Z0184 Encounter for antibody response examination: Secondary | ICD-10-CM | POA: Diagnosis not present

## 2019-08-01 DIAGNOSIS — Z20822 Contact with and (suspected) exposure to covid-19: Secondary | ICD-10-CM | POA: Diagnosis present

## 2019-08-01 DIAGNOSIS — E0590 Thyrotoxicosis, unspecified without thyrotoxic crisis or storm: Secondary | ICD-10-CM | POA: Diagnosis not present

## 2019-08-01 DIAGNOSIS — R296 Repeated falls: Secondary | ICD-10-CM | POA: Diagnosis not present

## 2019-08-01 DIAGNOSIS — M2550 Pain in unspecified joint: Secondary | ICD-10-CM | POA: Diagnosis not present

## 2019-08-01 DIAGNOSIS — S0181XA Laceration without foreign body of other part of head, initial encounter: Secondary | ICD-10-CM | POA: Diagnosis present

## 2019-08-01 DIAGNOSIS — Z8249 Family history of ischemic heart disease and other diseases of the circulatory system: Secondary | ICD-10-CM

## 2019-08-01 DIAGNOSIS — Z66 Do not resuscitate: Secondary | ICD-10-CM | POA: Diagnosis not present

## 2019-08-01 DIAGNOSIS — I4891 Unspecified atrial fibrillation: Secondary | ICD-10-CM | POA: Diagnosis not present

## 2019-08-01 DIAGNOSIS — R1310 Dysphagia, unspecified: Secondary | ICD-10-CM | POA: Diagnosis not present

## 2019-08-01 DIAGNOSIS — I361 Nonrheumatic tricuspid (valve) insufficiency: Secondary | ICD-10-CM | POA: Diagnosis not present

## 2019-08-01 DIAGNOSIS — I63431 Cerebral infarction due to embolism of right posterior cerebral artery: Secondary | ICD-10-CM

## 2019-08-01 DIAGNOSIS — Z833 Family history of diabetes mellitus: Secondary | ICD-10-CM | POA: Diagnosis not present

## 2019-08-01 DIAGNOSIS — I4821 Permanent atrial fibrillation: Secondary | ICD-10-CM | POA: Diagnosis not present

## 2019-08-01 DIAGNOSIS — I160 Hypertensive urgency: Secondary | ICD-10-CM | POA: Diagnosis not present

## 2019-08-01 DIAGNOSIS — I639 Cerebral infarction, unspecified: Secondary | ICD-10-CM | POA: Diagnosis not present

## 2019-08-01 DIAGNOSIS — Y92129 Unspecified place in nursing home as the place of occurrence of the external cause: Secondary | ICD-10-CM | POA: Diagnosis not present

## 2019-08-01 DIAGNOSIS — F0280 Dementia in other diseases classified elsewhere without behavioral disturbance: Secondary | ICD-10-CM | POA: Diagnosis present

## 2019-08-01 DIAGNOSIS — G309 Alzheimer's disease, unspecified: Secondary | ICD-10-CM | POA: Diagnosis not present

## 2019-08-01 DIAGNOSIS — R531 Weakness: Secondary | ICD-10-CM | POA: Diagnosis not present

## 2019-08-01 DIAGNOSIS — I499 Cardiac arrhythmia, unspecified: Secondary | ICD-10-CM | POA: Diagnosis not present

## 2019-08-01 DIAGNOSIS — S299XXA Unspecified injury of thorax, initial encounter: Secondary | ICD-10-CM | POA: Diagnosis not present

## 2019-08-01 DIAGNOSIS — Z7982 Long term (current) use of aspirin: Secondary | ICD-10-CM

## 2019-08-01 DIAGNOSIS — F0390 Unspecified dementia without behavioral disturbance: Secondary | ICD-10-CM | POA: Diagnosis not present

## 2019-08-01 DIAGNOSIS — R4182 Altered mental status, unspecified: Secondary | ICD-10-CM | POA: Diagnosis not present

## 2019-08-01 LAB — BASIC METABOLIC PANEL
Anion gap: 10 (ref 5–15)
BUN: 27 mg/dL — ABNORMAL HIGH (ref 8–23)
CO2: 26 mmol/L (ref 22–32)
Calcium: 8.8 mg/dL — ABNORMAL LOW (ref 8.9–10.3)
Chloride: 107 mmol/L (ref 98–111)
Creatinine, Ser: 0.83 mg/dL (ref 0.44–1.00)
GFR calc Af Amer: >60 mL/min (ref >60)
GFR calc non Af Amer: >60 mL/min (ref >60)
Glucose, Bld: 138 mg/dL — ABNORMAL HIGH (ref 70–99)
Potassium: 3.4 mmol/L — ABNORMAL LOW (ref 3.5–5.1)
Sodium: 143 mmol/L (ref 135–145)

## 2019-08-01 LAB — CBC WITH DIFFERENTIAL/PLATELET
Abs Immature Granulocytes: 0.06 10*3/uL (ref 0.00–0.07)
Basophils Absolute: 0 10*3/uL (ref 0.0–0.1)
Basophils Relative: 0 %
Eosinophils Absolute: 0.2 10*3/uL (ref 0.0–0.5)
Eosinophils Relative: 2 %
HCT: 37 % (ref 36.0–46.0)
Hemoglobin: 11.4 g/dL — ABNORMAL LOW (ref 12.0–15.0)
Immature Granulocytes: 1 %
Lymphocytes Relative: 5 %
Lymphs Abs: 0.5 10*3/uL — ABNORMAL LOW (ref 0.7–4.0)
MCH: 28.9 pg (ref 26.0–34.0)
MCHC: 30.8 g/dL (ref 30.0–36.0)
MCV: 93.7 fL (ref 80.0–100.0)
Monocytes Absolute: 0.5 10*3/uL (ref 0.1–1.0)
Monocytes Relative: 5 %
Neutro Abs: 8.9 10*3/uL — ABNORMAL HIGH (ref 1.7–7.7)
Neutrophils Relative %: 87 %
Platelets: 272 10*3/uL (ref 150–400)
RBC: 3.95 MIL/uL (ref 3.87–5.11)
RDW: 14.1 % (ref 11.5–15.5)
WBC: 10.2 10*3/uL (ref 4.0–10.5)
nRBC: 0 % (ref 0.0–0.2)

## 2019-08-01 LAB — CBC
HCT: 35.7 % — ABNORMAL LOW (ref 36.0–46.0)
Hemoglobin: 11 g/dL — ABNORMAL LOW (ref 12.0–15.0)
MCH: 28.9 pg (ref 26.0–34.0)
MCHC: 30.8 g/dL (ref 30.0–36.0)
MCV: 93.9 fL (ref 80.0–100.0)
Platelets: 227 10*3/uL (ref 150–400)
RBC: 3.8 MIL/uL — ABNORMAL LOW (ref 3.87–5.11)
RDW: 14 % (ref 11.5–15.5)
WBC: 8.2 10*3/uL (ref 4.0–10.5)
nRBC: 0 % (ref 0.0–0.2)

## 2019-08-01 LAB — TROPONIN I (HIGH SENSITIVITY): Troponin I (High Sensitivity): 6 ng/L (ref <18)

## 2019-08-01 LAB — POC SARS CORONAVIRUS 2 AG -  ED: SARS Coronavirus 2 Ag: NEGATIVE

## 2019-08-01 LAB — RESPIRATORY PANEL BY RT PCR (FLU A&B, COVID)
Influenza A by PCR: NEGATIVE
Influenza B by PCR: NEGATIVE
SARS Coronavirus 2 by RT PCR: NEGATIVE

## 2019-08-01 LAB — CREATININE, SERUM
Creatinine, Ser: 0.81 mg/dL (ref 0.44–1.00)
GFR calc Af Amer: >60 mL/min (ref >60)
GFR calc non Af Amer: >60 mL/min (ref >60)

## 2019-08-01 MED ORDER — RIVASTIGMINE 13.3 MG/24HR TD PT24
13.3000 mg | MEDICATED_PATCH | Freq: Every day | TRANSDERMAL | Status: DC
  Administered 2019-08-02 – 2019-08-08 (×7): 13.3 mg via TRANSDERMAL
  Filled 2019-08-01 (×8): qty 1

## 2019-08-01 MED ORDER — STROKE: EARLY STAGES OF RECOVERY BOOK
Freq: Once | Status: DC
  Filled 2019-08-01: qty 1

## 2019-08-01 MED ORDER — MIDAZOLAM HCL 2 MG/2ML IJ SOLN
3.0000 mg | Freq: Once | INTRAMUSCULAR | Status: AC
  Administered 2019-08-01: 3 mg via INTRAMUSCULAR
  Filled 2019-08-01: qty 4

## 2019-08-01 MED ORDER — ACETAMINOPHEN 650 MG RE SUPP: 650.0000 mg | RECTAL | Status: DC | PRN

## 2019-08-01 MED ORDER — ASPIRIN 300 MG RE SUPP
300.0000 mg | Freq: Every day | RECTAL | Status: DC
  Administered 2019-08-01 – 2019-08-02 (×2): 300 mg via RECTAL
  Filled 2019-08-01 (×5): qty 1

## 2019-08-01 MED ORDER — MAGNESIUM SULFATE 2 GM/50ML IV SOLN
2.0000 g | Freq: Once | INTRAVENOUS | Status: AC
  Administered 2019-08-01: 2 g via INTRAVENOUS
  Filled 2019-08-01: qty 50

## 2019-08-01 MED ORDER — ACETAMINOPHEN 160 MG/5ML PO SOLN: 650.0000 mg | ORAL | Status: DC | PRN

## 2019-08-01 MED ORDER — MIDAZOLAM HCL 2 MG/2ML IJ SOLN: 2.0000 mg | Freq: Once | INTRAMUSCULAR | Status: DC

## 2019-08-01 MED ORDER — ACETAMINOPHEN 325 MG PO TABS
650.0000 mg | ORAL_TABLET | ORAL | Status: DC | PRN
  Administered 2019-08-02 – 2019-08-03 (×2): 650 mg via ORAL
  Filled 2019-08-01 (×2): qty 2

## 2019-08-01 MED ORDER — ENOXAPARIN SODIUM 40 MG/0.4ML ~~LOC~~ SOLN
40.0000 mg | SUBCUTANEOUS | Status: DC
  Administered 2019-08-01 – 2019-08-06 (×6): 40 mg via SUBCUTANEOUS
  Filled 2019-08-01 (×7): qty 0.4

## 2019-08-01 MED ORDER — ASPIRIN 325 MG PO TABS
325.0000 mg | ORAL_TABLET | Freq: Every day | ORAL | Status: DC
  Administered 2019-08-03 – 2019-08-07 (×5): 325 mg via ORAL
  Filled 2019-08-01 (×5): qty 1

## 2019-08-01 MED ORDER — POTASSIUM CHLORIDE IN NACL 20-0.9 MEQ/L-% IV SOLN
INTRAVENOUS | Status: DC
  Filled 2019-08-01 (×14): qty 1000

## 2019-08-01 NOTE — ED Triage Notes (Signed)
Patient coming from [facility]. Patient was found on the floor by staff. Patient have right forehead laceration. Bleeding control not on blood thinners and no obvious deformity noted. Patient alert to person only hx dementia. Per staff at nursing home patient is positive [phone].

## 2019-08-01 NOTE — ED Notes (Signed)
Attempted to call report x 2. Receiving RN giving direct patient care. Stated she will call back at [phone].

## 2019-08-01 NOTE — ED Notes (Signed)
[facility] called for transportation. Paperwork at nurses station. 

## 2019-08-01 NOTE — ED Notes (Signed)
Patient transported to CT 

## 2019-08-01 NOTE — ED Notes (Signed)
Patient transported to MRI 

## 2019-08-01 NOTE — ED Notes (Addendum)
Pt is drowsy, unable to complete NIH score and stroke swallow screen at this time.

## 2019-08-01 NOTE — ED Notes (Signed)
[facility] at bedside 

## 2019-08-01 NOTE — ED Provider Notes (Signed)
[facility]ENC[facility]PT Provider Note   CSN: [id number] Arrival date & time: 08/01/19  0901     History Fall, head injury  Amy Randall is a 85 y.o. female history of severe dementia, A. fib, not on blood thinners, presenting to the ED from a nursing facility after fall.  Patient was found to have small right-sided forehead laceration.  She is alert only to herself at baseline.  According to the staff there is a facility the patient is Covid positive.  Patient cannot provide any further history due to dementia.  Update -I spoke to the patient's daughter reports that the patient has been acutely progressive mental decline for the past 2 years.  She has had frequent falls at her facility.  She is not on blood thinners.  HPI     Past Medical History:  Diagnosis Date  . Atrial fibrillation (HCC)   . Dementia (HCC)   . Hypertension     Patient Active Problem List   Diagnosis Date Noted  . Stroke (HCC) 08/01/2019  . Tachycardia 01/02/2019  . Acute CHF (congestive heart failure) (HCC) 01/02/2019  . Hip fracture (HCC) 12/14/2018  . Essential hypertension 12/14/2018  . Dementia without behavioral disturbance (HCC) 12/14/2018  . AF (paroxysmal atrial fibrillation) (HCC) 12/14/2018  . Hyperglycemia 12/14/2018    Past Surgical History:  Procedure Laterality Date  . INTRAMEDULLARY (IM) NAIL INTERTROCHANTERIC Right 12/14/2018   Procedure: INTRAMEDULLARY (IM) NAIL INTERTROCHANTRIC;  Surgeon: Yolonda Kida, MD;  Location: [facility] OR;  Service: Orthopedics;  Laterality: Right;     OB History   No obstetric history on file.     Family History  Problem Relation Age of Onset  . Hypertension Father   . Stroke Father   . Diabetes Maternal Grandmother     Social History   Tobacco Use  . Smoking status: Never Smoker  . Smokeless tobacco: Never Used  Substance Use Topics  . Alcohol use: Not on file  . Drug use: Not on file    Home  Medications Prior to Admission medications   Medication Sig Start Date End Date Taking? Authorizing Provider  diltiazem (CARDIZEM CD) 360 MG 24 hr capsule Take 1 capsule (360 mg total) by mouth daily. 01/04/19  Yes [name], MD  Ensure (ENSURE) Take 237 mLs by mouth 3 (three) times daily between meals.   Yes [provider]  furosemide (LASIX) 20 MG tablet Take 1 tablet (20 mg total) by mouth daily. 01/04/19  Yes [name], MD  LORazepam (ATIVAN) 0.5 MG tablet Take 0.5 mg by mouth 4 (four) times daily as needed (agitation).  06/10/19  Yes [provider]  methimazole (TAPAZOLE) 5 MG tablet Take 5 mg by mouth daily. 06/04/19  Yes [provider]  metoprolol tartrate (LOPRESSOR) 25 MG tablet Take 1 tablet (25 mg total) by mouth 2 (two) times daily. 01/04/19  Yes [name], MD  potassium chloride (KLOR-CON) 10 MEQ tablet Take 10 mEq by mouth daily. 06/10/19  Yes [provider]  rivastigmine (EXELON) 13.3 MG/24HR Place 13.3 mg onto the skin daily. 06/10/19  Yes [provider]  sertraline (ZOLOFT) 50 MG tablet Take 50 mg by mouth daily. 06/28/19  Yes [provider]  aspirin 81 MG chewable tablet Chew 1 tablet (81 mg total) by mouth daily. Patient not taking: Reported on 08/01/2019 01/04/19   [name], MD  docusate sodium (COLACE) 100 MG capsule Take 1 capsule (100 mg total) by mouth 2 (two) times  daily. Patient not taking: Reported on 01/02/2019 12/17/18   Jerald Kief, MD    Allergies    Bactrim [sulfamethoxazole-trimethoprim], Ibuprofen, Orange fruit [citrus], and Penicillins  Review of Systems   Review of Systems  Unable to perform ROS: Dementia (level 5 caveat)    Physical Exam Updated Vital Signs BP (!) 108/46   Pulse 65   Temp (!) 97.4 F (36.3 C)   Resp 20   SpO2 99%   Physical Exam Vitals and nursing note reviewed.  Constitutional:      General: She is not in acute distress.    Appearance: She is  well-developed.     Comments: Thin, frail  HENT:     Head: Normocephalic and atraumatic. No raccoon eyes, Battle's sign, abrasion or contusion.     Comments: Small 1 cm laceration to right forehead Eyes:     Conjunctiva/sclera: Conjunctivae normal.     Pupils: Pupils are equal, round, and reactive to light.  Neck:     Comments: C spine collar in place Neck pain without focal tenderness on exam Cardiovascular:     Rate and Rhythm: Normal rate and regular rhythm.     Pulses: Normal pulses.  Pulmonary:     Effort: Pulmonary effort is normal. No respiratory distress.  Abdominal:     Palpations: Abdomen is soft.     Tenderness: There is no abdominal tenderness.  Musculoskeletal:        General: No tenderness or deformity.  Skin:    General: Skin is warm and dry.  Neurological:     Mental Status: She is alert. Mental status is at baseline.     ED Results / Procedures / Treatments   Labs (all labs ordered are listed, but only abnormal results are displayed) Labs Reviewed  BASIC METABOLIC PANEL - Abnormal; Notable for the following components:      Result Value   Potassium 3.4 (*)    Glucose, Bld 138 (*)    BUN 27 (*)    Calcium 8.8 (*)    All other components within normal limits  CBC WITH DIFFERENTIAL/PLATELET - Abnormal; Notable for the following components:   Hemoglobin 11.4 (*)    Neutro Abs 8.9 (*)    Lymphs Abs 0.5 (*)    All other components within normal limits  RESPIRATORY PANEL BY RT PCR (FLU A&B, COVID)  POC SARS CORONAVIRUS 2 AG -  ED  TROPONIN I (HIGH SENSITIVITY)    EKG EKG Interpretation  Date/Time:  Friday August 01 2019 09:44:50 EST Ventricular Rate:  71 PR Interval:    QRS Duration: 88 QT Interval:  606 QTC Calculation: 645 R Axis:   78 Text Interpretation: Atrial fibrillation Anteroseptal infarct, age indeterminate Prolonged QT interval No STEMI Confirmed by Alvester Chou [phone]) on 08/01/2019 1:15:42 PM   Radiology CT Head Wo  Contrast  Result Date: 08/01/2019 CLINICAL DATA:  Head trauma with right forehead laceration. EXAM: CT HEAD WITHOUT CONTRAST CT CERVICAL SPINE WITHOUT CONTRAST TECHNIQUE: Multidetector CT imaging of the head and cervical spine was performed following the standard protocol without intravenous contrast. Multiplanar CT image reconstructions of the cervical spine were also generated. COMPARISON:  None. FINDINGS: CT HEAD FINDINGS Brain: Lucency with loss of gray-white differentiation is identified in right occipital lobe. There is no midline shift, hydrocephalus or acute hemorrhage. Small old lacunar infarctions are identified in bilateral basal ganglia. Chronic diffuse atrophy is noted. Chronic bilateral periventricular white matter small vessel ischemic changes are noted. Vascular: No hyperdense  vessel is noted. Skull: Normal. Negative for fracture or focal lesion. Sinuses/Orbits: No acute finding. Other: None. CT CERVICAL SPINE FINDINGS Alignment: Scoliosis of the spine is identified. Skull base and vertebrae: No acute fracture. No primary bone lesion or focal pathologic process. Soft tissues and spinal canal: No prevertebral fluid or swelling. No visible canal hematoma. Disc levels: Degenerative joint changes with narrowed joint space and osteophyte formation are identified throughout the cervical spine. Upper chest: Multiple nodules identified in bilateral thyroid gland. The visualized lung apices are normal. Other: None. IMPRESSION: 1. Lucency with loss of gray-white differentiation is identified in the right occipital lobe suspicious for acute infarct. 2. No focal acute hemorrhage. 3. No acute fracture or dislocation of cervical spine. 4. Degenerative joint changes of cervical spine. 5. Multiple nodules identified in bilateral thyroid gland. This may be due to multinodular goiter. Electronically Signed   By: Sherian Rein M.D.   On: 08/01/2019 10:40   CT Cervical Spine Wo Contrast  Result Date:  08/01/2019 CLINICAL DATA:  Head trauma with right forehead laceration. EXAM: CT HEAD WITHOUT CONTRAST CT CERVICAL SPINE WITHOUT CONTRAST TECHNIQUE: Multidetector CT imaging of the head and cervical spine was performed following the standard protocol without intravenous contrast. Multiplanar CT image reconstructions of the cervical spine were also generated. COMPARISON:  None. FINDINGS: CT HEAD FINDINGS Brain: Lucency with loss of gray-white differentiation is identified in right occipital lobe. There is no midline shift, hydrocephalus or acute hemorrhage. Small old lacunar infarctions are identified in bilateral basal ganglia. Chronic diffuse atrophy is noted. Chronic bilateral periventricular white matter small vessel ischemic changes are noted. Vascular: No hyperdense vessel is noted. Skull: Normal. Negative for fracture or focal lesion. Sinuses/Orbits: No acute finding. Other: None. CT CERVICAL SPINE FINDINGS Alignment: Scoliosis of the spine is identified. Skull base and vertebrae: No acute fracture. No primary bone lesion or focal pathologic process. Soft tissues and spinal canal: No prevertebral fluid or swelling. No visible canal hematoma. Disc levels: Degenerative joint changes with narrowed joint space and osteophyte formation are identified throughout the cervical spine. Upper chest: Multiple nodules identified in bilateral thyroid gland. The visualized lung apices are normal. Other: None. IMPRESSION: 1. Lucency with loss of gray-white differentiation is identified in the right occipital lobe suspicious for acute infarct. 2. No focal acute hemorrhage. 3. No acute fracture or dislocation of cervical spine. 4. Degenerative joint changes of cervical spine. 5. Multiple nodules identified in bilateral thyroid gland. This may be due to multinodular goiter. Electronically Signed   By: Sherian Rein M.D.   On: 08/01/2019 10:40   MR BRAIN WO CONTRAST  Result Date: 08/01/2019 CLINICAL DATA:  Head trauma. Right  forehead laceration. Abnormal CT of the head with possible acute infarct. EXAM: MRI HEAD WITHOUT CONTRAST TECHNIQUE: Multiplanar, multiecho pulse sequences of the brain and surrounding structures were obtained without intravenous contrast. COMPARISON:  CT head without contrast 08/01/2019 FINDINGS: Brain: The diffusion-weighted images confirm an acute/subacute nonhemorrhagic infarct of the right PCA territory. There is extensive involvement of the right occipital lobe, including the occipital pole. There is some involvement of the splenium of the corpus callosum as well as the posterior right hippocampus. T2 signal changes are associated with the areas of the acute/subacute infarction. Advanced atrophy and diffuse white matter disease present in addition. This likely reflects the sequela of chronic microvascular ischemia. Nonhemorrhagic lacunar infarcts are present in right basal ganglia. There is a remote hemorrhagic lacunar infarct of the left basal ganglia and corona radiata. A remote  lacunar infarct is present in the right thalamus. Diffuse white matter disease extends into the brainstem. Remote nonhemorrhagic lacunar infarcts are present in the cerebellum bilaterally. Vascular: Flow is present in the major intracranial arteries. Skull and upper cervical spine: The craniocervical junction is normal. Upper cervical spine is within normal limits. Multilevel degenerative changes are noted in the cervical spine. Endplate degenerative changes are associated. Marrow signal is otherwise normal. Craniocervical junction is normal. Sinuses/Orbits: The paranasal sinuses and mastoid air cells are clear. The globes and orbits are within normal limits. IMPRESSION: 1. Acute/subacute nonhemorrhagic right PCA territory infarct involving the right occipital lobe, splenium of the corpus callosum, and posterior right hippocampus. 2. Advanced atrophy and diffuse white matter disease likely reflects the sequela of chronic  microvascular ischemia. 3. Remote hemorrhagic lacunar infarcts of the left basal ganglia and corona radiata. 4. Remote nonhemorrhagic lacunar infarcts of the cerebellum bilaterally. Electronically Signed   By: San Morelle M.D.   On: 08/01/2019 14:50    Procedures Procedures (including critical care time)  Medications Ordered in ED Medications  magnesium sulfate IVPB 2 g 50 mL (0 g Intravenous Stopped 08/01/19 1140)  midazolam (VERSED) injection 3 mg (3 mg Intramuscular Given 08/01/19 1357)    ED Course  I have reviewed the triage vital signs and the nursing notes.  Pertinent labs & imaging results that were available during my care of the patient were reviewed by me and considered in my medical decision making (see chart for details).  85 year old female who is reportedly recently Covid positive presenting from nursing facility with mechanical fall. She is quite thin and frail and has significant dementia. Patient has very small laceration to the right forehead.  Too small for suture closure.  I believe it can close with primary intention.  Tetanus reportedly uptodate.  Plan to obtain [facility] and C-spine for trauma evaluation.  Patient has no other evidence of acute trauma on my exam.   If workup is negative, can discharge back to nursing facility.  Update - ecg demonstrated prolonged QTc, will check electrolytes and med rec  Clinical Course as of Aug 01 1603  Fri Aug 01, 2019  1347 Informed by nurse that the patient has removed her second IV today.  We will instead give her an IM dose of Versed prior to her MRI.   [MT]  7341 Also spoken to the patient's daughter Arcelia Jew and informed her of the medical workup and pending MR scan   [MT]  1500 IMPRESSION: 1. Acute/subacute nonhemorrhagic right PCA territory infarct involving the right occipital lobe, splenium of the corpus callosum, and posterior right hippocampus. 2. Advanced atrophy and diffuse white matter disease likely  reflects the sequela of chronic microvascular ischemia. 3. Remote hemorrhagic lacunar infarcts of the left basal ganglia and corona radiata. 4. Remote nonhemorrhagic lacunar infarcts of the cerebellum bilaterally.   [MT]  1505 Dr Leonel Ramsay neurologist at [facility] states patient will need transfer to [facility] as hospitalist admission   [MT]  1544 Patient's daughter Arcelia Jew updated about plan to transfer patient to [facility].  Asked her about code status, she reports patient did have advanced directive/living will at nursing facility but this was not sent over   [MT]  1546 No one answering at [facility]    [MT]  1602 Will transfer to [facility] stepdown, spoke to hospitalist   [MT]    Clinical Course User Index [MT] Nakaya Mishkin, Carola Rhine, MD    Final Clinical Impression(s) / ED Diagnoses Final diagnoses:  Prolonged QT interval  Fall, initial encounter  Laceration of forehead, initial encounter  Cerebrovascular accident (CVA), unspecified mechanism (HCC)  COVID-19    Rx / DC Orders ED Discharge Orders    None       Phyllip Claw, Kermit BaloMatthew J, MD 08/01/19 [phone]

## 2019-08-01 NOTE — Consult Note (Signed)
NEURO HOSPITALIST CONSULT NOTE   Requestig physician: Dr. Jonathon Bellows  Reason for Consult: Stroke  History obtained from:   Chart    HPI:                                                                                                                                          Amy Randall is an 85 y.o. female with a history of frequent falls, dementia and atrial fibrillation (not on anticoagulation), who was brought to the [facility] ED from her SNF after a fall there. She was found on the floor by staff with a right forehead laceration. On arrival to the ED, the patient was alert to person only. Per staff at the SNF the patient is Covid-positive. CT head was obtained, revealing a right occipital lobe hypodensity suspicious for a subacute ischemic infarction, which was confirmed on MRI.   ED course: BUN elevated at 27 Hgb 11.4 EKG: Prolonged QTc  CT head: 1. Lucency with loss of gray-white differentiation is identified in the right occipital lobe suspicious for acute infarct. 2. No focal acute hemorrhage. 3. No acute fracture or dislocation of cervical spine. 4. Degenerative joint changes of cervical spine. 5. Multiple nodules identified in bilateral thyroid gland. This may be due to multinodular goiter.  MRI brain: 1. Acute/subacute nonhemorrhagic right PCA territory infarct involving the right occipital lobe, splenium of the corpus callosum, and posterior right hippocampus. 2. Advanced atrophy and diffuse white matter disease likely reflects the sequela of chronic microvascular ischemia. 3. Remote hemorrhagic lacunar infarcts of the left basal ganglia and corona radiata. 4. Remote nonhemorrhagic lacunar infarcts of the cerebellum Bilaterally.  EKG: Atrial fibrillation Anteroseptal infarct, age indeterminate Prolonged QT interval  Past Medical History:  Diagnosis Date  . Atrial fibrillation (HCC)   . Dementia (HCC)   . Hypertension     Past Surgical History:   Procedure Laterality Date  . INTRAMEDULLARY (IM) NAIL INTERTROCHANTERIC Right 12/14/2018   Procedure: INTRAMEDULLARY (IM) NAIL INTERTROCHANTRIC;  Surgeon: Yolonda Kida, MD;  Location: [facility];  Service: Orthopedics;  Laterality: Right;    Family History  Problem Relation Age of Onset  . Hypertension Father   . Stroke Father   . Diabetes Maternal Grandmother               Social History:  reports that she has never smoked. She has never used smokeless tobacco. No history on file for alcohol and drug.  Allergies  Allergen Reactions  . Bactrim [Sulfamethoxazole-Trimethoprim] Other (See Comments)    Loss of memory  . Ibuprofen Other (See Comments)    Causes bruising  . Orange Fruit [Citrus]     Per patient's daughter, orange snesitivity  . Penicillins Rash    Has patient had a PCN reaction causing  immediate rash, facial/tongue/throat swelling, SOB or lightheadedness with hypotension: Yes Has patient had a PCN reaction causing severe rash involving mucus membranes or skin necrosis: No Has patient had a PCN reaction that required hospitalization: No Has patient had a PCN reaction occurring within the last 10 years: No If all of the above answers are "NO", then may proceed with Cephalosporin use.     MEDICATIONS:                                                                                                                     Prior to Admission:  Medications Prior to Admission  Medication Sig Dispense Refill Last Dose  . diltiazem (CARDIZEM CD) 360 MG 24 hr capsule Take 1 capsule (360 mg total) by mouth daily. 30 capsule 12 08/01/2019 at 0900  . Ensure (ENSURE) Take 237 mLs by mouth 3 (three) times daily between meals.   08/01/2019 at 0800  . furosemide (LASIX) 20 MG tablet Take 1 tablet (20 mg total) by mouth daily. 30 tablet 12 08/01/2019 at 0900  . LORazepam (ATIVAN) 0.5 MG tablet Take 0.5 mg by mouth 4 (four) times daily as needed (agitation).    unk  . methimazole  (TAPAZOLE) 5 MG tablet Take 5 mg by mouth daily.   08/01/2019 at 0800  . metoprolol tartrate (LOPRESSOR) 25 MG tablet Take 1 tablet (25 mg total) by mouth 2 (two) times daily. 30 tablet 12 08/01/2019 at 0900  . potassium chloride (KLOR-CON) 10 MEQ tablet Take 10 mEq by mouth daily.   08/01/2019 at 0900  . rivastigmine (EXELON) 13.3 MG/24HR Place 13.3 mg onto the skin daily.   08/01/2019 at 0900  . sertraline (ZOLOFT) 50 MG tablet Take 50 mg by mouth daily.   08/01/2019 at 0900   Scheduled: .  stroke: mapping our early stages of recovery book   Does not apply Once  . aspirin  300 mg Rectal Daily   Or  . aspirin  325 mg Oral Daily  . enoxaparin (LOVENOX) injection  40 mg Subcutaneous Q24H  . rivastigmine  13.3 mg Transdermal Daily     ROS:                                                                                                                                       Unable to obtain due to severe dementia.    Blood pressure (!) 148/71, pulse 69,  temperature (!) 97.4 F (36.3 C), resp. rate 20, SpO2 97 %.   General Examination:                                                                                                       Physical Exam  HEENT-  Small right forehead laceration.  Lungs- Respirations unlabored Extremities- No edema  Neurological Examination Mental Status: Awake and alert. Confused. Unable to answer any questions correctly - will utter short phrases that are somewhat related to the questions asked but with incorrect answers. Limited speech output is fluent without dysarthria. Could not name a glove or a mask. Unable to follow any commands.  Cranial Nerves: II: Blinks to threat in both temporal visual fields. Pupils equal  III,IV, VI: Will visually track to left and right with saccadic pursuits. Eyes conjugate. No nystagmus.  VII: Face is symmetric VIII: hearing intact to voice IX,X: Unable to visualize palate XI: Does not follow commands for testing XII:  Does not follow commands for testing Motor: Will grip examiner's hands with equal strength but does not follow command for elicitation of maximum grip strength. Arms remain elevated after examiner passively raises them, without asymmetry.  Will withdraw BLE to noxious plantar stimulation. Not following commands for more detailed testing.  Sensory: Reacts to tactile stimuli x 4 Deep Tendon Reflexes: 2+ bilateral brachioradialis. Hypoactive patellars.  Plantars: Equivocal bilaterally Cerebellar/Gait: Unable to assess   Lab Results: Basic Metabolic Panel: Recent Labs  Lab 08/01/19 1037  NA 143  K 3.4*  CL 107  CO2 26  GLUCOSE 138*  BUN 27*  CREATININE 0.83  CALCIUM 8.8*    CBC: Recent Labs  Lab 08/01/19 1037  WBC 10.2  NEUTROABS 8.9*  HGB 11.4*  HCT 37.0  MCV 93.7  PLT 272    Cardiac Enzymes: No results for input(s): CKTOTAL, CKMB, CKMBINDEX, TROPONINI in the last 168 hours.  Lipid Panel: No results for input(s): CHOL, TRIG, HDL, CHOLHDL, VLDL, LDLCALC in the last 168 hours.  Imaging: CT Head Wo Contrast  Result Date: 08/01/2019 CLINICAL DATA:  Head trauma with right forehead laceration. EXAM: CT HEAD WITHOUT CONTRAST CT CERVICAL SPINE WITHOUT CONTRAST TECHNIQUE: Multidetector CT imaging of the head and cervical spine was performed following the standard protocol without intravenous contrast. Multiplanar CT image reconstructions of the cervical spine were also generated. COMPARISON:  None. FINDINGS: CT HEAD FINDINGS Brain: Lucency with loss of gray-white differentiation is identified in right occipital lobe. There is no midline shift, hydrocephalus or acute hemorrhage. Small old lacunar infarctions are identified in bilateral basal ganglia. Chronic diffuse atrophy is noted. Chronic bilateral periventricular white matter small vessel ischemic changes are noted. Vascular: No hyperdense vessel is noted. Skull: Normal. Negative for fracture or focal lesion. Sinuses/Orbits: No  acute finding. Other: None. CT CERVICAL SPINE FINDINGS Alignment: Scoliosis of the spine is identified. Skull base and vertebrae: No acute fracture. No primary bone lesion or focal pathologic process. Soft tissues and spinal canal: No prevertebral fluid or swelling. No visible canal hematoma. Disc levels: Degenerative joint changes with narrowed joint space  and osteophyte formation are identified throughout the cervical spine. Upper chest: Multiple nodules identified in bilateral thyroid gland. The visualized lung apices are normal. Other: None. IMPRESSION: 1. Lucency with loss of gray-white differentiation is identified in the right occipital lobe suspicious for acute infarct. 2. No focal acute hemorrhage. 3. No acute fracture or dislocation of cervical spine. 4. Degenerative joint changes of cervical spine. 5. Multiple nodules identified in bilateral thyroid gland. This may be due to multinodular goiter. Electronically Signed   By: Abelardo Diesel M.D.   On: 08/01/2019 10:40   CT Cervical Spine Wo Contrast  Result Date: 08/01/2019 CLINICAL DATA:  Head trauma with right forehead laceration. EXAM: CT HEAD WITHOUT CONTRAST CT CERVICAL SPINE WITHOUT CONTRAST TECHNIQUE: Multidetector CT imaging of the head and cervical spine was performed following the standard protocol without intravenous contrast. Multiplanar CT image reconstructions of the cervical spine were also generated. COMPARISON:  None. FINDINGS: CT HEAD FINDINGS Brain: Lucency with loss of gray-white differentiation is identified in right occipital lobe. There is no midline shift, hydrocephalus or acute hemorrhage. Small old lacunar infarctions are identified in bilateral basal ganglia. Chronic diffuse atrophy is noted. Chronic bilateral periventricular white matter small vessel ischemic changes are noted. Vascular: No hyperdense vessel is noted. Skull: Normal. Negative for fracture or focal lesion. Sinuses/Orbits: No acute finding. Other: None. CT  CERVICAL SPINE FINDINGS Alignment: Scoliosis of the spine is identified. Skull base and vertebrae: No acute fracture. No primary bone lesion or focal pathologic process. Soft tissues and spinal canal: No prevertebral fluid or swelling. No visible canal hematoma. Disc levels: Degenerative joint changes with narrowed joint space and osteophyte formation are identified throughout the cervical spine. Upper chest: Multiple nodules identified in bilateral thyroid gland. The visualized lung apices are normal. Other: None. IMPRESSION: 1. Lucency with loss of gray-white differentiation is identified in the right occipital lobe suspicious for acute infarct. 2. No focal acute hemorrhage. 3. No acute fracture or dislocation of cervical spine. 4. Degenerative joint changes of cervical spine. 5. Multiple nodules identified in bilateral thyroid gland. This may be due to multinodular goiter. Electronically Signed   By: Abelardo Diesel M.D.   On: 08/01/2019 10:40   MR BRAIN WO CONTRAST  Result Date: 08/01/2019 CLINICAL DATA:  Head trauma. Right forehead laceration. Abnormal CT of the head with possible acute infarct. EXAM: MRI HEAD WITHOUT CONTRAST TECHNIQUE: Multiplanar, multiecho pulse sequences of the brain and surrounding structures were obtained without intravenous contrast. COMPARISON:  CT head without contrast 08/01/2019 FINDINGS: Brain: The diffusion-weighted images confirm an acute/subacute nonhemorrhagic infarct of the right PCA territory. There is extensive involvement of the right occipital lobe, including the occipital pole. There is some involvement of the splenium of the corpus callosum as well as the posterior right hippocampus. T2 signal changes are associated with the areas of the acute/subacute infarction. Advanced atrophy and diffuse white matter disease present in addition. This likely reflects the sequela of chronic microvascular ischemia. Nonhemorrhagic lacunar infarcts are present in right basal ganglia.  There is a remote hemorrhagic lacunar infarct of the left basal ganglia and corona radiata. A remote lacunar infarct is present in the right thalamus. Diffuse white matter disease extends into the brainstem. Remote nonhemorrhagic lacunar infarcts are present in the cerebellum bilaterally. Vascular: Flow is present in the major intracranial arteries. Skull and upper cervical spine: The craniocervical junction is normal. Upper cervical spine is within normal limits. Multilevel degenerative changes are noted in the cervical spine. Endplate degenerative changes are associated.  Marrow signal is otherwise normal. Craniocervical junction is normal. Sinuses/Orbits: The paranasal sinuses and mastoid air cells are clear. The globes and orbits are within normal limits. IMPRESSION: 1. Acute/subacute nonhemorrhagic right PCA territory infarct involving the right occipital lobe, splenium of the corpus callosum, and posterior right hippocampus. 2. Advanced atrophy and diffuse white matter disease likely reflects the sequela of chronic microvascular ischemia. 3. Remote hemorrhagic lacunar infarcts of the left basal ganglia and corona radiata. 4. Remote nonhemorrhagic lacunar infarcts of the cerebellum bilaterally. Electronically Signed   By: Marin Roberts M.D.   On: 08/01/2019 14:50   DG CHEST PORT 1 VIEW  Result Date: 08/01/2019 CLINICAL DATA:  85 year old female with lethargy and history of positive COVID-19. reported fall. EXAM: PORTABLE CHEST 1 VIEW COMPARISON:  Chest radiograph dated 01/02/2019 and CT dated 01/02/2019. FINDINGS: There is eventration of the right hemidiaphragm, increased since the prior radiograph. There is no focal consolidation, pleural effusion, or pneumothorax. There is cardiomegaly. No significant congestion or edema. Atherosclerotic calcification of the aorta. No acute osseous pathology. IMPRESSION: 1. No acute cardiopulmonary process. 2. Cardiomegaly. No evidence of congestive heart failure.  3. Right hemidiaphragm eventration. Electronically Signed   By: Elgie Collard M.D.   On: 08/01/2019 17:31   Echocardiogram 01/02/19:  1. The left ventricle has normal systolic function with an ejection fraction of 60-65%. The cavity size was normal. There is mildly increased left ventricular wall thickness. Left ventricular diastolic Doppler parameters are indeterminate secondary to  atrial fibrillation.  2. Left atrial size was severely dilated.  3. Right atrial size was severely dilated.  4. The aortic valve is tricuspid. Mild thickening of the aortic valve. Mild calcification of the aortic valve. Mild aortic annular calcification noted.  5. The mitral valve is abnormal. Mild thickening of the mitral valve leaflet. Mild calcification of the mitral valve leaflet. There is mild mitral annular calcification present. No evidence of mitral valve stenosis.  6. The aortic root is normal in size and structure.  7. Pulmonary hypertension is mildly elevated, PASP is 36 mmHg.  8. The inferior vena cava was dilated in size with <50% respiratory variability.  9. Incidental finding of large liver cyst   Assessment: 85 year old female with subacute right PCA territory ischemic infarction.  1. Limited exam in the context of her severe dementia reveals no definite focal abnormality. She blinks to threat in both temporal visual fields.  2. MRI brain: Acute/subacute nonhemorrhagic right PCA territory infarct involving the right occipital lobe, splenium of the corpus callosum and posterior right hippocampus. 3. Also seen on MRI is advanced atrophy and diffuse white matter disease likely reflecting sequela of chronic microvascular ischemia. There are remote hemorrhagic lacunar infarcts of the left basal ganglia and corona radiata, as well as remote nonhemorrhagic lacunar infarcts of the cerebellum bilaterally. 4. Stroke risk factors: Atrial fibrillation and HTN 5. Most likely etiology for her stroke is  cardioembolic in the setting of atrial fibrillation 6. Covid negative here at [facility], but with report of Covid positivity at her SNF, she remains on Covid-level contact and airborne precautions 7. Prolonged QTc  Recommendations: 1. Due to frequent falls, cannot anticoagulate 2. CTA of head and neck 3. Agree with starting the patient on ASA 4. Benefits of statin most likely outweighed by risks given her advanced age 7. BP management 6. Cardiac telemetry 7. PT/OT/Speech 8. IV hydration.  9. Avoid medications that can result in QT prolongation    Electronically signed: Dr. Caryl Pina 08/01/2019, 7:36  PM

## 2019-08-01 NOTE — ED Notes (Signed)
Attempted to call report x 1. Receiving RN giving patient care, will call back.

## 2019-08-01 NOTE — H&P (Signed)
History and Physical    Amy Randall HYQ:[id number] DOB: 1934/01/30 DOA: 08/01/2019  PCP: Kari BaarsHawkins, Edward, MD   Patient coming from:Nursing facility.  Chief Complaint:Fall.  HPI: Amy Randall is a 85 y.o. female with medical history significant for dementia, atrial fibrillation, hypertension, hyperthyroidism sent from nursing home due to fall.Patient is unable to get history obtained from the ER chart ED physician and from patient's daughter over the phone.Patient was found found on the floor by the staff with right forehead laceration and some bleeding.At baseline she is alert to person only,and has been having acutely progressive mental decline for the past 2 years and has had frequent falls at the facility.No report of fever,respiratory distress or vomiting or diarrhea.   In ED: Blood pressure stable, heart rate In A fib rate controlled , saturating 99% on room air, low temperature 97.4, lab work with potassium 3.4, BUN 27, troponin VI, WBC stable,rapid Covid antigen negative but Covid PCR pending.  Initially had CT head and CT C-spine subsequently MRI that showed acute/subacute nonhemorrhagic right PCA territory infarct involving the right occipital lobe, splenium of the corpus callosum, and posterior right hippocampus and advanced atrophy and diffuse white matter disease,Remote hemorrhagic lacunar infarcts of the left basal ganglia and corona radiata Remote nonhemorrhagic lacunar infarcts of the cerebellum bilaterally."  Patient received Versed prior to MRI, on my evaluation patient is lethargic able to withdraw right leg to pain, on room air saturating well.  Dr. Amada JupiterKirkpatrick from neurology was consulted advised [facility] for further stroke work-up  Review of Systems:  Unable to obtain due to patient's mental status   Past Medical History:  Diagnosis Date  . Atrial fibrillation (HCC)   . Dementia (HCC)   . Hypertension     Past Surgical History:  Procedure  Laterality Date  . INTRAMEDULLARY (IM) NAIL INTERTROCHANTERIC Right 12/14/2018   Procedure: INTRAMEDULLARY (IM) NAIL INTERTROCHANTRIC;  Surgeon: Yolonda Kidaogers, Jason Patrick, MD;  [facility] OR;  Service: Orthopedics;  Laterality: Right;     reports that she has never smoked. She has never used smokeless tobacco. No history on file for alcohol and drug.  Allergies  Allergen Reactions  . Bactrim [Sulfamethoxazole-Trimethoprim] Other (See Comments)    Loss of memory  . Ibuprofen Other (See Comments)    Causes bruising  . Orange Fruit [Citrus]     Per patient's daughter, orange snesitivity  . Penicillins Rash    Has patient had a PCN reaction causing immediate rash, facial/tongue/throat swelling, SOB or lightheadedness with hypotension: Yes Has patient had a PCN reaction causing severe rash involving mucus membranes or skin necrosis: No Has patient had a PCN reaction that required hospitalization: No Has patient had a PCN reaction occurring within the last 10 years: No If all of the above answers are "NO", then may proceed with Cephalosporin use.     Family History  Problem Relation Age of Onset  . Hypertension Father   . Stroke Father   . Diabetes Maternal Grandmother      Prior to Admission medications   Medication Sig Start Date End Date Taking? Authorizing Provider  diltiazem (CARDIZEM CD) 360 MG 24 hr capsule Take 1 capsule (360 mg total) by mouth daily. 01/04/19  Yes Kari BaarsHawkins, Edward, MD  Ensure (ENSURE) Take 237 mLs by mouth 3 (three) times daily between meals.   Yes [provider]  furosemide (LASIX) 20 MG tablet Take 1 tablet (20 mg total) by mouth daily. 01/04/19  Yes Kari BaarsHawkins, Edward, MD  LORazepam (ATIVAN) 0.5 MG tablet Take 0.5 mg by mouth 4 (four) times daily as needed (agitation).  06/10/19  Yes [provider]  methimazole (TAPAZOLE) 5 MG tablet Take 5 mg by mouth daily. 06/04/19  Yes [provider]  metoprolol tartrate (LOPRESSOR) 25 MG tablet  Take 1 tablet (25 mg total) by mouth 2 (two) times daily. 01/04/19  Yes [name], MD  potassium chloride (KLOR-CON) 10 MEQ tablet Take 10 mEq by mouth daily. 06/10/19  Yes [provider]  rivastigmine (EXELON) 13.3 MG/24HR Place 13.3 mg onto the skin daily. 06/10/19  Yes [provider]  sertraline (ZOLOFT) 50 MG tablet Take 50 mg by mouth daily. 06/28/19  Yes [provider]  aspirin 81 MG chewable tablet Chew 1 tablet (81 mg total) by mouth daily. Patient not taking: Reported on 08/01/2019 01/04/19   [name], MD  docusate sodium (COLACE) 100 MG capsule Take 1 capsule (100 mg total) by mouth 2 (two) times daily. Patient not taking: Reported on 01/02/2019 12/17/18   Jerald Kief, MD    Physical Exam: Vitals:   08/01/19 1025 08/01/19 1030 08/01/19 1100 08/01/19 1200  BP:   119/71 (!) 108/46  Pulse: (!) 45 72 65   Resp: Temp:      SpO2: 100% 99% 99%    General exam: Lethargic sleepy, on room air, opens eyes briefly.  Elderly and frail. HEENT:Oral mucosa moist, Ear/Nose WNL grossly, dentition normal. Respiratory system: Bilateral equal air entry, no crackles and wheezing, no use of accessory muscle. Cardiovascular system: regular rate and rhythm, S1 & S2 heard, No JVD/murmurs. Gastrointestinal system: Abdomen soft, non-tender, non-distended, BS + Nervous System: Drowsy, limited exam due to patient's sedation.  Slightly withdraws right leg to pain, b/l elbow flexed Extremities: No edema, distal peripheral pulses palpable.  Skin: No rashes,no icterus. MSK: Normal muscle bulk,tone, power  Labs on Admission: I have personally reviewed following labs and imaging studies  CBC: Recent Labs  Lab 08/01/19 1037  WBC 10.2  NEUTROABS 8.9*  HGB 11.4*  HCT 37.0  MCV 93.7  PLT 272   Basic Metabolic Panel: Recent Labs  Lab 08/01/19 1037  NA 143  K 3.4*  CL 107  CO2 26  GLUCOSE 138*  BUN 27*  CREATININE 0.83  CALCIUM 8.8*    GFR: CrCl cannot be calculated (Unknown ideal weight.). Liver Function Tests: No results for input(s): AST, ALT, ALKPHOS, BILITOT, PROT, ALBUMIN in the last 168 hours. No results for input(s): LIPASE, AMYLASE in the last 168 hours. No results for input(s): AMMONIA in the last 168 hours. Coagulation Profile: No results for input(s): INR, PROTIME in the last 168 hours. Cardiac Enzymes: No results for input(s): CKTOTAL, CKMB, CKMBINDEX, TROPONINI in the last 168 hours. BNP (last 3 results) No results for input(s): PROBNP in the last 8760 hours. HbA1C: No results for input(s): HGBA1C in the last 72 hours. CBG: No results for input(s): GLUCAP in the last 168 hours. Lipid Profile: No results for input(s): CHOL, HDL, LDLCALC, TRIG, CHOLHDL, LDLDIRECT in the last 72 hours. Thyroid Function Tests: No results for input(s): TSH, T4TOTAL, FREET4, T3FREE, THYROIDAB in the last 72 hours. Anemia Panel: No results for input(s): VITAMINB12, FOLATE, FERRITIN, TIBC, IRON, RETICCTPCT in the last 72 hours. Urine analysis:    Component Value Date/Time   COLORURINE YELLOW 01/02/2019 1022   APPEARANCEUR CLEAR 01/02/2019 1022   LABSPEC 1.011 01/02/2019 1022   PHURINE 7.0 01/02/2019 1022   GLUCOSEU NEGATIVE  01/02/2019 1022   HGBUR NEGATIVE 01/02/2019 1022   BILIRUBINUR NEGATIVE 01/02/2019 1022   KETONESUR NEGATIVE 01/02/2019 1022   PROTEINUR NEGATIVE 01/02/2019 1022   NITRITE NEGATIVE 01/02/2019 1022   LEUKOCYTESUR NEGATIVE 01/02/2019 1022    Radiological Exams on Admission: CT Head Wo Contrast  Result Date: 08/01/2019 CLINICAL DATA:  Head trauma with right forehead laceration. EXAM: CT HEAD WITHOUT CONTRAST CT CERVICAL SPINE WITHOUT CONTRAST TECHNIQUE: Multidetector CT imaging of the head and cervical spine was performed following the standard protocol without intravenous contrast. Multiplanar CT image reconstructions of the cervical spine were also generated. COMPARISON:  None. FINDINGS: CT  HEAD FINDINGS Brain: Lucency with loss of gray-white differentiation is identified in right occipital lobe. There is no midline shift, hydrocephalus or acute hemorrhage. Small old lacunar infarctions are identified in bilateral basal ganglia. Chronic diffuse atrophy is noted. Chronic bilateral periventricular white matter small vessel ischemic changes are noted. Vascular: No hyperdense vessel is noted. Skull: Normal. Negative for fracture or focal lesion. Sinuses/Orbits: No acute finding. Other: None. CT CERVICAL SPINE FINDINGS Alignment: Scoliosis of the spine is identified. Skull base and vertebrae: No acute fracture. No primary bone lesion or focal pathologic process. Soft tissues and spinal canal: No prevertebral fluid or swelling. No visible canal hematoma. Disc levels: Degenerative joint changes with narrowed joint space and osteophyte formation are identified throughout the cervical spine. Upper chest: Multiple nodules identified in bilateral thyroid gland. The visualized lung apices are normal. Other: None. IMPRESSION: 1. Lucency with loss of gray-white differentiation is identified in the right occipital lobe suspicious for acute infarct. 2. No focal acute hemorrhage. 3. No acute fracture or dislocation of cervical spine. 4. Degenerative joint changes of cervical spine. 5. Multiple nodules identified in bilateral thyroid gland. This may be due to multinodular goiter. Electronically Signed   By: Sherian Rein M.D.   On: 08/01/2019 10:40   CT Cervical Spine Wo Contrast  Result Date: 08/01/2019 CLINICAL DATA:  Head trauma with right forehead laceration. EXAM: CT HEAD WITHOUT CONTRAST CT CERVICAL SPINE WITHOUT CONTRAST TECHNIQUE: Multidetector CT imaging of the head and cervical spine was performed following the standard protocol without intravenous contrast. Multiplanar CT image reconstructions of the cervical spine were also generated. COMPARISON:  None. FINDINGS: CT HEAD FINDINGS Brain: Lucency with  loss of gray-white differentiation is identified in right occipital lobe. There is no midline shift, hydrocephalus or acute hemorrhage. Small old lacunar infarctions are identified in bilateral basal ganglia. Chronic diffuse atrophy is noted. Chronic bilateral periventricular white matter small vessel ischemic changes are noted. Vascular: No hyperdense vessel is noted. Skull: Normal. Negative for fracture or focal lesion. Sinuses/Orbits: No acute finding. Other: None. CT CERVICAL SPINE FINDINGS Alignment: Scoliosis of the spine is identified. Skull base and vertebrae: No acute fracture. No primary bone lesion or focal pathologic process. Soft tissues and spinal canal: No prevertebral fluid or swelling. No visible canal hematoma. Disc levels: Degenerative joint changes with narrowed joint space and osteophyte formation are identified throughout the cervical spine. Upper chest: Multiple nodules identified in bilateral thyroid gland. The visualized lung apices are normal. Other: None. IMPRESSION: 1. Lucency with loss of gray-white differentiation is identified in the right occipital lobe suspicious for acute infarct. 2. No focal acute hemorrhage. 3. No acute fracture or dislocation of cervical spine. 4. Degenerative joint changes of cervical spine. 5. Multiple nodules identified in bilateral thyroid gland. This may be due to multinodular goiter. Electronically Signed   By: Sherian Rein M.D.   On:  08/01/2019 10:40   MR BRAIN WO CONTRAST  Result Date: 08/01/2019 CLINICAL DATA:  Head trauma. Right forehead laceration. Abnormal CT of the head with possible acute infarct. EXAM: MRI HEAD WITHOUT CONTRAST TECHNIQUE: Multiplanar, multiecho pulse sequences of the brain and surrounding structures were obtained without intravenous contrast. COMPARISON:  CT head without contrast 08/01/2019 FINDINGS: Brain: The diffusion-weighted images confirm an acute/subacute nonhemorrhagic infarct of the right PCA territory. There is  extensive involvement of the right occipital lobe, including the occipital pole. There is some involvement of the splenium of the corpus callosum as well as the posterior right hippocampus. T2 signal changes are associated with the areas of the acute/subacute infarction. Advanced atrophy and diffuse white matter disease present in addition. This likely reflects the sequela of chronic microvascular ischemia. Nonhemorrhagic lacunar infarcts are present in right basal ganglia. There is a remote hemorrhagic lacunar infarct of the left basal ganglia and corona radiata. A remote lacunar infarct is present in the right thalamus. Diffuse white matter disease extends into the brainstem. Remote nonhemorrhagic lacunar infarcts are present in the cerebellum bilaterally. Vascular: Flow is present in the major intracranial arteries. Skull and upper cervical spine: The craniocervical junction is normal. Upper cervical spine is within normal limits. Multilevel degenerative changes are noted in the cervical spine. Endplate degenerative changes are associated. Marrow signal is otherwise normal. Craniocervical junction is normal. Sinuses/Orbits: The paranasal sinuses and mastoid air cells are clear. The globes and orbits are within normal limits. IMPRESSION: 1. Acute/subacute nonhemorrhagic right PCA territory infarct involving the right occipital lobe, splenium of the corpus callosum, and posterior right hippocampus. 2. Advanced atrophy and diffuse white matter disease likely reflects the sequela of chronic microvascular ischemia. 3. Remote hemorrhagic lacunar infarcts of the left basal ganglia and corona radiata. 4. Remote nonhemorrhagic lacunar infarcts of the cerebellum bilaterally. Electronically Signed   By: San Morelle M.D.   On: 08/01/2019 14:50   EKG Personally reviewed shows atrial fibrillation  Assessment/Plan  Acute/subacute nonhemorrhagic right PCA territory infarct involving the right occipital lobe,  splenium of the corpus callosum, and posterior right hippocampus : Admit the patient to [facility] neurology has been notified from the ER. BP goal : Permissive HTN upto 220/110 mmHg .MRI Brain as above.Furhter plan Echocardiogram Carotid duplex, Check HgbA1c, fasting lipid panel. PT consult, OT consult, Speech consult and NPO until then. Cont telemetry monitoring,Frequent neuro checks-patient currently sedated after Versed for MRI.  Hypo-kalemia: We will replete w/ kcl in ivf.  ?? Covid + as per ED but when I called her daughter she told me she was tested negative on last Wednesday. Patient asymptomatic saturating well on room air, not in respiratory distress. AWAIT COVID PCR and keep on isolation until then.  Spoke with the bed placement not to send the patient to Covid unit for now.  Fall at nursing home with laceration,CT head and MRI noted as above.Patient has had frequent falls.  Advanced dementia:Continue supportive care fall precaution.  Atrial fibrillation likely permanent,known history, noted in  EKG, rate is stable at times slow ventricuar rate. Not on anticoagulation,at nursing home,due to her frequent falls.  Hypertension:Blood Pressure well controlled hold Cardizem allow permissive hypertension.  Hyperthyroidism on methimazole -resume once able to take p.o.  Goals of care: Overall prognosis is guarded and will involve palliative care and help with GOC.  There is no height or weight on file to calculate BMI.   Severity of Illness: * I certify that at the point of admission it is  my clinical judgment that the patient will require inpatient hospital care spanning beyond 2 midnights from the point of admission due to high intensity of service,high risk for further deterioration and high frequency of surveillance required due to acute stroke.   DVT prophylaxis:Lovenox. Code Status:Full code based upon discussion with patient's daughter.  She is going to go over her ADVANCED  DIRECTIVES AND LET us KNOW if she is changing it. Family Communication: Admission,patients condition and plan of care including tests being ordered have been discussed with the PATIENT'S DAUGHTER who indicate understanding and agree with the plan and Code Status.  Consults called: Neurology Dr. Karl Luke MD [facility] Pager [phone]  If 7PM-7AM, please contact night-coverage [email or web address]  08/01/2019, 4:20 PM

## 2019-08-02 ENCOUNTER — Inpatient Hospital Stay (HOSPITAL_COMMUNITY): Payer: Medicare Other

## 2019-08-02 DIAGNOSIS — I340 Nonrheumatic mitral (valve) insufficiency: Secondary | ICD-10-CM

## 2019-08-02 DIAGNOSIS — I63431 Cerebral infarction due to embolism of right posterior cerebral artery: Principal | ICD-10-CM

## 2019-08-02 DIAGNOSIS — I639 Cerebral infarction, unspecified: Secondary | ICD-10-CM

## 2019-08-02 DIAGNOSIS — Z515 Encounter for palliative care: Secondary | ICD-10-CM

## 2019-08-02 DIAGNOSIS — I361 Nonrheumatic tricuspid (valve) insufficiency: Secondary | ICD-10-CM

## 2019-08-02 LAB — CBC WITH DIFFERENTIAL/PLATELET
Abs Immature Granulocytes: 0.04 10*3/uL (ref 0.00–0.07)
Basophils Absolute: 0 10*3/uL (ref 0.0–0.1)
Basophils Relative: 1 %
Eosinophils Absolute: 0.3 10*3/uL (ref 0.0–0.5)
Eosinophils Relative: 5 %
HCT: 36.4 % (ref 36.0–46.0)
Hemoglobin: 11.5 g/dL — ABNORMAL LOW (ref 12.0–15.0)
Immature Granulocytes: 1 %
Lymphocytes Relative: 10 %
Lymphs Abs: 0.5 10*3/uL — ABNORMAL LOW (ref 0.7–4.0)
MCH: 28.7 pg (ref 26.0–34.0)
MCHC: 31.6 g/dL (ref 30.0–36.0)
MCV: 90.8 fL (ref 80.0–100.0)
Monocytes Absolute: 0.3 10*3/uL (ref 0.1–1.0)
Monocytes Relative: 6 %
Neutro Abs: 4.2 10*3/uL (ref 1.7–7.7)
Neutrophils Relative %: 77 %
Platelets: 218 10*3/uL (ref 150–400)
RBC: 4.01 MIL/uL (ref 3.87–5.11)
RDW: 13.7 % (ref 11.5–15.5)
WBC: 5.4 10*3/uL (ref 4.0–10.5)
nRBC: 0 % (ref 0.0–0.2)

## 2019-08-02 LAB — BASIC METABOLIC PANEL
Anion gap: 7 (ref 5–15)
BUN: 17 mg/dL (ref 8–23)
CO2: 25 mmol/L (ref 22–32)
Calcium: 8.4 mg/dL — ABNORMAL LOW (ref 8.9–10.3)
Chloride: 109 mmol/L (ref 98–111)
Creatinine, Ser: 0.73 mg/dL (ref 0.44–1.00)
GFR calc Af Amer: >60 mL/min (ref >60)
GFR calc non Af Amer: >60 mL/min (ref >60)
Glucose, Bld: 85 mg/dL (ref 70–99)
Potassium: 3.7 mmol/L (ref 3.5–5.1)
Sodium: 141 mmol/L (ref 135–145)

## 2019-08-02 LAB — ECHOCARDIOGRAM COMPLETE

## 2019-08-02 LAB — SAR COV2 SEROLOGY (COVID19)AB(IGG),IA: SARS-CoV-2 Ab, IgG: NONREACTIVE

## 2019-08-02 MED ORDER — METOPROLOL TARTRATE 5 MG/5ML IV SOLN
2.5000 mg | Freq: Once | INTRAVENOUS | Status: AC
  Administered 2019-08-02: 2.5 mg via INTRAVENOUS
  Filled 2019-08-02: qty 5

## 2019-08-02 NOTE — Progress Notes (Signed)
Carotid artery duplex completed. Refer to "CV Proc" under chart review to view preliminary results.  08/02/2019 2:24 PM Kelby Aline., MHA, RVT, RDCS, RDMS

## 2019-08-02 NOTE — Progress Notes (Signed)
PT Cancellation Note  Patient Details Name: Amy Randall MRN: [id number] DOB: [birth year]   Cancelled Treatment:    Reason Eval/Treat Not Completed: Patient at procedure or test/unavailable  Pt has been out of her room and unavailable for PT consult today. Will likely check back on pt tomorrow.   Melvern Banker 08/02/2019, 1:46 PM Lavonia Dana, PT   Acute Rehabilitation Services  Pager [phone] Office [phone] 08/02/2019

## 2019-08-02 NOTE — Evaluation (Signed)
Clinical/Bedside Swallow Evaluation Patient Details  Name: ANAELLE DUNTON MRN: [id number] Date of Birth: 12-09-1933  Today's Date: 08/02/2019 Time: SLP Start Time (ACUTE ONLY): 1245 SLP Stop Time (ACUTE ONLY): 1300 SLP Time Calculation (min) (ACUTE ONLY): 15 min  Past Medical History:  Past Medical History:  Diagnosis Date  . Atrial fibrillation ([place])   . Dementia ([place])   . Hypertension    Past Surgical History:  Past Surgical History:  Procedure Laterality Date  . INTRAMEDULLARY (IM) NAIL INTERTROCHANTERIC Right 12/14/2018   Procedure: INTRAMEDULLARY (IM) NAIL INTERTROCHANTRIC;  Surgeon: Nicholes Stairs, MD;  Location: [facility];  Service: Orthopedics;  Laterality: Right;   HPI:  Patient is an 85 y.o. female with advanced dementia who has been living at [facility] since July of 2020. PMH: atrial fibrillation, dementia, hyperthyroidism, HTN who was found on floor of ALF with right forehead laceration and bleeding. At baseline, she has had frequent falls at the facility and mental decline over the past two years. MRI revealed acute/subacute nonhemorragic right PCA territory infarct.   Assessment / Plan / Recommendation Clinical Impression  Patient presents with a mild oropharyngeal dysphagia without overt s/s of aspiration or penetration even with successive sips of thin liquids. She did exhibit prolonged mastication and oral transit with regular solids. Dysphagia is likely related to her dementia but at this time, suspected risk of aspiration is mild with supervision of patient and assistance with feeding as needed. SLP Visit Diagnosis: Dysphagia, unspecified (R13.10)    Aspiration Risk  Mild aspiration risk    Diet Recommendation Dysphagia 2 (Fine chop);Thin liquid   Liquid Administration via: Cup;Straw Medication Administration: Whole meds with puree Supervision: Full supervision/cueing for compensatory strategies;Patient able to self feed;Staff to assist with self  feeding Compensations: Minimize environmental distractions;Slow rate;Small sips/bites Postural Changes: Seated upright at 90 degrees    Other  Recommendations Oral Care Recommendations: Oral care BID   Follow up Recommendations Skilled Nursing facility      Frequency and Duration min 1 x/week  1 week       Prognosis Prognosis for Safe Diet Advancement: Good Barriers to Reach Goals: Cognitive deficits      Swallow Study   General Date of Onset: 08/01/19 HPI: Patient is an 85 y.o. female with advanced dementia who has been living at [facility] since July of 2020. PMH: atrial fibrillation, dementia, hyperthyroidism, HTN who was found on floor of ALF with right forehead laceration and bleeding. At baseline, she has had frequent falls at the facility and mental decline over the past two years. MRI revealed acute/subacute nonhemorragic right PCA territory infarct. Type of Study: Bedside Swallow Evaluation Previous Swallow Assessment: N/A Diet Prior to this Study: NPO Temperature Spikes Noted: No Respiratory Status: Room air History of Recent Intubation: No Behavior/Cognition: Alert;Cooperative;Pleasant mood;Confused;Requires cueing;Doesn't follow directions Oral Cavity Assessment: Within Functional Limits Oral Care Completed by SLP: Yes Oral Cavity - Dentition: Adequate natural dentition Vision: Functional for self-feeding Self-Feeding Abilities: Able to feed self;Needs set up Patient Positioning: Upright in bed Baseline Vocal Quality: Normal Volitional Cough: Cognitively unable to elicit Volitional Swallow: Unable to elicit    Oral/Motor/Sensory Function Overall Oral Motor/Sensory Function: Within functional limits   Ice Chips     Thin Liquid Thin Liquid: Within functional limits Presentation: Cup;Self Fed;Straw Other Comments: No overt s/s of aspiration or penetration even with successive sips of liquids    Nectar Thick     Honey Thick     Puree Puree: Within functional  limits  Presentation: Spoon   Solid     Solid: Impaired Oral Phase Impairments: Impaired mastication;Poor awareness of bolus Oral Phase Functional Implications: Prolonged oral transit      Elio Forget Tarrell 08/02/2019,6:49 PM   Angela Nevin, MA, CCC-SLP Speech Therapy [facility]

## 2019-08-02 NOTE — Progress Notes (Signed)
[facility] Progress Note  Patient: Amy Randall EGB:[id number]   PCP: [name], MD DOB: 20-May-1934   DOA: 08/01/2019   DOS: 08/02/2019   Date of Service: the patient was seen and examined on 08/02/2019  Chief complaint Unwitnessed fall  Brief hospital course: [name] is a 85 y.o. female with medical history significant for dementia, atrial fibrillation, hypertension, hyperthyroidism sent from nursing home due to fall.Patient is unable to get history obtained from the ER chart ED physician and from patient's daughter over the phone.Patient was found found on the floor by the staff with right forehead laceration and some bleeding.At baseline she is alert to person only,and has been having acutely progressive mental decline for the past 2 years and has had frequent falls at the facility.No report of fever,respiratory distress or vomiting or diarrhea.   In ED: Blood pressure stable, heart rate In A fib rate controlled , saturating 99% on room air, low temperature 97.4, lab work with potassium 3.4, BUN 27, troponin VI, WBC stable,rapid Covid antigen negative but Covid PCR pending.  Initially had CT head and CT C-spine subsequently MRI that showed acute/subacute nonhemorrhagic right PCA territory infarct involving the right occipital lobe, splenium of the corpus callosum, and posterior right hippocampus and advanced atrophy and diffuse white matter disease,Remote hemorrhagic lacunar infarcts of the left basal ganglia and corona radiata Remote nonhemorrhagic lacunar infarcts of the cerebellum bilaterally."  Patient received Versed prior to MRI, on my evaluation patient is lethargic able to withdraw right leg to pain, on room air saturating well.  Dr. Amada Jupiter from neurology was consulted advised admission to [facility] for further stroke work-up  Currently further plan is continue further stroke work-up as well as continue to engage with the family regarding goals of care  discussion.  Subjective: Patient is minimally responsive.  Answers questions yes or no.  Denies any acute complaint.  No nausea or vomiting.  No diarrhea reported by RN.  Assessment and Plan: Scheduled Meds: .  stroke: mapping our early stages of recovery book   Does not apply Once  . aspirin  300 mg Rectal Daily   Or  . aspirin  325 mg Oral Daily  . enoxaparin (LOVENOX) injection  40 mg Subcutaneous Q24H  . rivastigmine  13.3 mg Transdermal Daily   Continuous Infusions: . 0.9 % NaCl with KCl 20 mEq / L 75 mL/hr at 08/02/19 0905   PRN Meds: acetaminophen **OR** acetaminophen (TYLENOL) oral liquid 160 mg/5 mL **OR** acetaminophen  Acute/subacute nonhemorrhagic right PCA territory infarct involving the right occipital lobe, splenium of the corpus callosum, and posterior right hippocampus :  Neurology consulted. Patient was started on stroke pathway. Currently on aspirin suppository. PT OT consult pending. Patient is actually from memory care unit. Speech therapy consult recommends speech dysphagia 2 diet. At present given the size of the stroke as well as patient's current dementia suspect that the patient's prognosis is significantly poor and guarded. This was conveyed to the family. They want to continue current care. Further stroke work-up currently pending.  Hypo-kalemia:  Monitor.  Concern for Covid infection. Daughter is concerned that patient may have Covid infection in November. ED as well as nursing home is concerned that the patient may have a Covid infection as well. Patient is from [facility] memory care unit which actually has Covid positive patients. Patient's Covid test is negative as well as Covid antibody test is negative and does not have any respiratory illness on examination nor has any  evidence of infiltrates on the chest x-ray therefore I will not give isolating the patient.   Fall at nursing home with laceration, Unwitnessed. Apparently patient does have  multiple falls at home. Because of that reason patient is not a candidate for anticoagulation for A. fib. CT head and MRI noted as above.  Advanced dementia:Continue supportive care fall precaution.  Atrial fibrillation likely permanent,known history, noted in  EKG, rate is stable at times slow ventricuar rate. Not on anticoagulation,at nursing home,due to her frequent falls.  Hypertension:Blood Pressure well controlled hold Cardizem allow permissive hypertension.  Hyperthyroidism on methimazole -resume once able to take p.o.  Goals of care: Overall prognosis is guarded and will involve palliative care and help with GOC. Palliative care consulted. Had extensive discussion with the daughter on the phone. She wants to for patient's living will which she has not seen before. She also wants to see the patient before making any decision. She is an Char[employer] fundraiser.  Diet: Dysphagia type 2  DVT Prophylaxis: Subcutaneous Lovenox   Advance goals of care discussion: Full code  Family Communication: no family was present at bedside, at the time of interview.  Discussed with patient's daughter, opportunity was given to ask question and all questions were answered satisfactorily.   Disposition:  Discharge to be determined.  Consultants: Palliative care, neurology Procedures: Echocardiogram   Antibiotics: Anti-infectives (From admission, onward)   None       Objective: Physical Exam: Vitals:   08/02/19 0000 08/02/19 0511 08/02/19 1000 08/02/19 1700  BP: (!) 151/80 134/72 (!) 156/94 (!) 150/85  Pulse: 91 75 81 90  Resp:   18   Temp: 98.1 F (36.7 C)  97.7 F (36.5 C) 98 F (36.7 C)  TempSrc:   Oral Oral  SpO2: 99% 98% 96% 97%    Intake/Output Summary (Last 24 hours) at 08/02/2019 1922 Last data filed at 08/02/2019 7829 Gross per 24 hour  Intake 822.13 ml  Output --  Net 822.13 ml   There were no vitals filed for this visit. General: lethargic and not oriented to time, place, and  person. Appear in moderate distress, affect flat in affect Eyes: PERRL, Conjunctiva normal ENT: Oral Mucosa Clear, dry  Neck: difficult to assess  JVD, no Abnormal Mass Or lumps Cardiovascular: S1 and S2 Present, aortic systolic Murmur,  Respiratory: good respiratory effort, Bilateral Air entry equal and Decreased, no signs of accessory muscle use, Clear to Auscultation, no Crackles, no wheezes Abdomen: Bowel Sound present, Soft and no tenderness, no hernia Skin: no rashes  Extremities: no Pedal edema, no calf tenderness Neurologic: Unable to see patient moving all extremities. unable to complete neurological examination due to patient's lack of cooperation. Gait not checked due to patient safety concerns  Data Reviewed: I have personally reviewed and interpreted daily labs, tele strips, imagings as discussed above. I reviewed all nursing notes, pharmacy notes, vitals, pertinent old records I have discussed plan of care as described above with RN and patient/family.  CBC: Recent Labs  Lab 08/01/19 1037 08/01/19 1850 08/02/19 1128  WBC 10.2 8.2 5.4  NEUTROABS 8.9*  --  4.2  HGB 11.4* 11.0* 11.5*  HCT 37.0 35.7* 36.4  MCV 93.7 93.9 90.8  PLT 272 227 218   Basic Metabolic Panel: Recent Labs  Lab 08/01/19 1037 08/01/19 1850 08/02/19 1128  NA 143  --  141  K 3.4*  --  3.7  CL 107  --  109  CO2 26  --  25  GLUCOSE  138*  --  85  BUN 27*  --  17  CREATININE 0.83 0.81 0.73  CALCIUM 8.8*  --  8.4*    Liver Function Tests: No results for input(s): AST, ALT, ALKPHOS, BILITOT, PROT, ALBUMIN in the last 168 hours. No results for input(s): LIPASE, AMYLASE in the last 168 hours. No results for input(s): AMMONIA in the last 168 hours. Coagulation Profile: No results for input(s): INR, PROTIME in the last 168 hours. Cardiac Enzymes: No results for input(s): CKTOTAL, CKMB, CKMBINDEX, TROPONINI in the last 168 hours. BNP (last 3 results) No results for input(s): PROBNP in the last  8760 hours. CBG: No results for input(s): GLUCAP in the last 168 hours. Studies: MR MRA HEAD WO CONTRAST  Result Date: 08/02/2019 CLINICAL DATA:  85 year old female with right PCA infarct on brain MRI yesterday. EXAM: MRA HEAD WITHOUT CONTRAST TECHNIQUE: Angiographic images of the Circle of Willis were obtained using MRA technique without intravenous contrast. COMPARISON:  Brain MRI 08/01/2019. FINDINGS: No intracranial mass effect. Ventricle size and configuration appears stable since yesterday. Antegrade flow in the posterior circulation with dominant appearing left vertebral artery. No distal vertebral stenosis and both PICA origins are patent. Patent basilar artery without stenosis. Patent SCA and PCA origins. Posterior communicating arteries are diminutive or absent. Normal right PCA origin, but there is moderate to severe irregularity and stenosis at the junction of the right P1 and P2 segments. There is preserved distal right PCA flow signal. Tortuous contralateral left P1 segment also with multifocal left P1 and P2 moderate to severe irregularity and stenosis (same image). Preserved distal left PCA branch flow also. Antegrade flow in both ICA siphons. Evidence of calcified siphon atherosclerosis, but only mild siphon stenosis most pronounced in the right anterior genu. Tortuous origin of the right ophthalmic artery incidentally noted. Patent carotid termini, MCA and ACA origins. Motion artifact degrades detail of the ACA branches which seem to remain patent. Bilateral MCA M1 segments and MCA bifurcations are patent, but motion degrades detail of the MCA branches. IMPRESSION: 1. Negative for large vessel occlusion in association with the Right PCA infarct, but positive for bilateral PCA moderate to severe irregularity (P1/P2 segment junction on the right). 2. Anterior circulation atherosclerosis with no significant stenosis through the proximal 2nd order branches, although other MCA and ACA branch  detail is degraded by motion. Electronically Signed   By: Odessa Fleming M.D.   On: 08/02/2019 11:50   ECHOCARDIOGRAM COMPLETE  Result Date: 08/02/2019   ECHOCARDIOGRAM REPORT   Patient Name:   Amy Randall Date of Exam: 08/02/2019 Medical Rec #:  [id number]         Height:       61.0 in Accession #:    [id number]        Weight:       100.0 lb Date of Birth:  04/22/1934         BSA:          1.41 m Patient Age:    85 years          BP:           156/94 mmHg Patient Gender: F                 HR:           81 bpm. Exam Location:  Inpatient Procedure: 2D Echo Indications:    Stroke 434.91/I163.9  History:        Patient has prior history of Echocardiogram  examinations, most                 recent 01/02/2019. Arrythmias:Atrial Fibrillation; Risk                 Factors:Hypertension.  Sonographer:    Ross Ludwig RDCS (AE) Referring Phys: [id number] [facility]  Sonographer Comments: Patient unresponsive when asked to sniff. IMPRESSIONS  1. Left ventricular ejection fraction, by visual estimation, is 60 to 65%. The left ventricle has normal function. There is mildly increased left ventricular hypertrophy.  2. The left ventricle has no regional wall motion abnormalities.  3. Global right ventricle has normal systolic function.The right ventricular size is normal. No increase in right ventricular wall thickness.  4. Left atrial size was severely dilated.  5. Right atrial size was normal.  6. The mitral valve is normal in structure. Moderate mitral valve regurgitation. No evidence of mitral stenosis.  7. 2 separate mitral regurgitant jets.  8. The tricuspid valve is normal in structure.  9. The aortic valve is normal in structure. Aortic valve regurgitation is trivial. No evidence of aortic valve sclerosis or stenosis. 10. The pulmonic valve was normal in structure. Pulmonic valve regurgitation is trivial. 11. The inferior vena cava is normal in size with greater than 50% respiratory variability, suggesting right atrial pressure  of 3 mmHg. 12. No intracardiac thrombi or masses were visualized. FINDINGS  Left Ventricle: Left ventricular ejection fraction, by visual estimation, is 60 to 65%. The left ventricle has normal function. The left ventricle has no regional wall motion abnormalities. There is mildly increased left ventricular hypertrophy. Normal left atrial pressure. Right Ventricle: The right ventricular size is normal. No increase in right ventricular wall thickness. Global RV systolic function is has normal systolic function. Left Atrium: Left atrial size was severely dilated. Right Atrium: Right atrial size was normal in size Pericardium: There is no evidence of pericardial effusion. Mitral Valve: The mitral valve is normal in structure. There is mild thickening of the mitral valve leaflet(s). Moderate mitral valve regurgitation. No evidence of mitral valve stenosis by observation. 2 separate mitral regurgitant jets. Tricuspid Valve: The tricuspid valve is normal in structure. Tricuspid valve regurgitation is mild. Aortic Valve: The aortic valve is normal in structure. Aortic valve regurgitation is trivial. The aortic valve is structurally normal, with no evidence of sclerosis or stenosis. Aortic valve mean gradient measures 3.0 mmHg. Aortic valve peak gradient measures 5.2 mmHg. Aortic valve area, by VTI measures 1.51 cm. Pulmonic Valve: The pulmonic valve was normal in structure. Pulmonic valve regurgitation is trivial. Pulmonic regurgitation is trivial. Aorta: The aortic root, ascending aorta and aortic arch are all structurally normal, with no evidence of dilitation or obstruction. Venous: The inferior vena cava is normal in size with greater than 50% respiratory variability, suggesting right atrial pressure of 3 mmHg. IAS/Shunts: No atrial level shunt detected by color flow Doppler. There is no evidence of a patent foramen ovale. No ventricular septal defect is seen or detected. There is no evidence of an atrial septal  defect. Additional Comments: No intracardiac thrombi or masses were visualized.  LEFT VENTRICLE PLAX 2D LVIDd:         4.10 cm LVIDs:         3.00 cm LV PW:         1.20 cm LV IVS:        1.20 cm LVOT diam:     1.70 cm LV SV:         39  ml LV SV Index:   28.15 LVOT Area:     2.27 cm  RIGHT VENTRICLE            IVC RV Basal diam:  2.50 cm    IVC diam: 2.00 cm RV S prime:     8.27 cm/s TAPSE (M-mode): 1.2 cm LEFT ATRIUM              Index       RIGHT ATRIUM           Index LA diam:        4.80 cm  3.41 cm/m  RA Area:     15.60 cm LA Vol (A2C):   106.0 ml 75.35 ml/m RA Volume:   32.60 ml  23.17 ml/m LA Vol (A4C):   79.5 ml  56.51 ml/m LA Biplane Vol: 96.9 ml  68.88 ml/m  AORTIC VALVE AV Area (Vmax):    1.59 cm AV Area (Vmean):   1.47 cm AV Area (VTI):     1.51 cm AV Vmax:           113.92 cm/s AV Vmean:          80.680 cm/s AV VTI:            0.220 m AV Peak Grad:      5.2 mmHg AV Mean Grad:      3.0 mmHg LVOT Vmax:         80.02 cm/s LVOT Vmean:        52.280 cm/s LVOT VTI:          0.147 m LVOT/AV VTI ratio: 0.67  AORTA Ao Root diam: 2.80 cm MR Peak grad:    158.3 mmHg  TRICUSPID VALVE MR Mean grad:    100.0 mmHg  TR Peak grad:   15.4 mmHg MR Vmax:         629.00 cm/s TR Vmax:        230.00 cm/s MR Vmean:        467.0 cm/s MR PISA:         1.57 cm    SHUNTS MR PISA Eff ROA: 8 mm       Systemic VTI:  0.15 m MR PISA Radius:  0.50 cm     Systemic Diam: 1.70 cm  Donato SchultzMark Skains MD Electronically signed by Donato SchultzMark Skains MD Signature Date/Time: 08/02/2019/2:37:12 PM    Final    [facility] and [facility])  Result Date: 08/02/2019 Carotid Arterial Duplex Study Indications: CVA. Performing Technologist: Gertie FeyMichelle Simonetti MHA, RDMS, RVT, RDCS  Examination Guidelines: A complete evaluation includes B-mode imaging, spectral Doppler, color Doppler, and power Doppler as needed of all accessible portions of each vessel. Bilateral testing is considered an integral part of a complete examination. Limited  examinations for reoccurring indications may be performed as noted.  Right Carotid Findings: +----------+--------+--------+--------+--------------------------+--------+           PSV cm/sEDV cm/sStenosisPlaque Description        Comments +----------+--------+--------+--------+--------------------------+--------+ CCA Prox  40      7                                                  +----------+--------+--------+--------+--------------------------+--------+ CCA Distal31      8               focal and  calcific                 +----------+--------+--------+--------+--------------------------+--------+ ICA Prox  27      8               heterogenous and irregular         +----------+--------+--------+--------+--------------------------+--------+ ICA Distal69      20                                                 +----------+--------+--------+--------+--------------------------+--------+ ECA       41      6                                                  +----------+--------+--------+--------+--------------------------+--------+ +----------+--------+-------+----------------+-------------------+           PSV cm/sEDV cmsDescribe        Arm Pressure (mmHG) +----------+--------+-------+----------------+-------------------+ [id number]             Multiphasic, WNL                    +----------+--------+-------+----------------+-------------------+ +---------+--------+--+--------+-+---------+ VertebralPSV cm/s24EDV cm/s8Antegrade +---------+--------+--+--------+-+---------+  Left Carotid Findings: +----------+--------+--------+--------+--------------------------+--------+           PSV cm/sEDV cm/sStenosisPlaque Description        Comments +----------+--------+--------+--------+--------------------------+--------+ CCA Prox  54      14              heterogenous and irregular          +----------+--------+--------+--------+--------------------------+--------+ CCA Distal48      14              heterogenous and irregular         +----------+--------+--------+--------+--------------------------+--------+ ICA Prox  36      11              heterogenous and irregular         +----------+--------+--------+--------+--------------------------+--------+ ICA Distal55      12                                                 +----------+--------+--------+--------+--------------------------+--------+ ECA       51      8               heterogenous and irregular         +----------+--------+--------+--------+--------------------------+--------+ +----------+--------+--------+--------------+-------------------+           PSV cm/sEDV cm/sDescribe      Arm Pressure (mmHG) +----------+--------+--------+--------------+-------------------+ Subclavian                Not identified                    +----------+--------+--------+--------------+-------------------+ +---------+--------+--+--------+-+---------+ VertebralPSV cm/s31EDV cm/s8Antegrade +---------+--------+--+--------+-+---------+  Summary: Right Carotid: Velocities in the right ICA are consistent with a 1-39% stenosis. Left Carotid: Velocities in the left ICA are consistent with a 1-39% stenosis. Vertebrals:  Bilateral vertebral arteries demonstrate antegrade flow. Subclavians: Left subclavian artery was not visualized. Normal flow hemodynamics              were seen in the right subclavian  artery. *See table(s) above for measurements and observations.     Preliminary      Time spent: 35 minutes  Author: Berle Mull, MD [facility] 08/02/2019 7:22 PM  To reach On-call, see care teams to locate the attending and reach out to them via [email or web address]. If 7PM-7AM, please contact night-coverage If you still have difficulty reaching the attending provider, please page the [facility] (Director on Call) for  [facility] on amion for assistance.

## 2019-08-02 NOTE — Progress Notes (Signed)
  Echocardiogram 2D Echocardiogram has been performed.  Amy Randall 08/02/2019, 2:10 PM

## 2019-08-02 NOTE — Consult Note (Signed)
Palliative Care Consult Note  Patient is an 85 yo woman with advanced dementia, he has been at [facility] assisted [facility] facility since July 2020.  She was admitted status post fall with facial laceration found to have a subacute cerebellar stroke.  She has widespread white matter disease in her brain, multiple areas of remote strokes, specially in the lacunar areas of her brain.  Her baseline functional status prior to this fall was that she was ambulatory at the assisted living facility would walk the halls and rest and had few behavioral issues.  This facility has had multiple cases of Covid however her daughter tells me that to her knowledge her mother never tested positive at the facility and the facility has not reported to her that she had a positive Covid test.  She has had recent weight loss.  At baseline she is poorly communicative.  Daughter  has been seeing her through remote visits.    The patient has a living will, advanced care directive that states that her daughter and her is the healthcare power of attorney with backup being the patient's son.  Her daughter reports that her mother never discussed her wishes with either of her children and that those issues were discussed only with her attorney. Her documents are being reviewed.  I introduced Palliative Care and how we could support care planning given her mothers serious condition. I also attempted to discuss code status , but her daughter reported that multiple providers have discussed this with her in the past 24 hours and it felt pressured. I tried to provide reassurance that code status determination should not in anyway impact treatment options or plan of care and should only be considered in the event of cardiac or respiratory arrest.   I prepared her that her mothers functional status would likely be very different following this event and she may need to consider a comfort care approach. No decisions were made  today.  Recommendations:  1. Needs comprehensive ACP with short and long term goals identified.  2. Full code for now, will readdress, leaving MOST form and Hard choices book for her daughter when she arrives. 3. Pain control post fall, Schedule Tylenol 4. Obtain PT, OT and SLP evals so we can make our best determination of what her new functional status level is currently and anticipate what to expect for a new baseline. 69. Daughter is going to discuss our conversation with her brother and also contact me after she see her mom in the hospital.  Lane Hacker, DO Palliative Medicine [phone]  Time: 50 min Greater than 50%  of this time was spent counseling and coordinating care related to the above assessment and plan.

## 2019-08-02 NOTE — Progress Notes (Addendum)
STROKE TEAM PROGRESS NOTE   INTERVAL HISTORY No family is at the bedside.  Patient lying in bed, able to tell me her name and age, however not cooperative with neuro exam.  Says "I do not care" for all the request.   OBJECTIVE Vitals:   08/01/19 2230 08/02/19 0000 08/02/19 0511 08/02/19 1000  BP: 134/79 (!) 151/80 134/72 (!) 156/94  Pulse: 75 91 75 81  Resp: 17   18  Temp:  98.1 F (36.7 C)  97.7 F (36.5 C)  TempSrc:    Oral  SpO2: 98% 99% 98% 96%    CBC:  Recent Labs  Lab 08/01/19 1037 08/01/19 1850 08/02/19 1128  WBC 10.2 8.2 5.4  NEUTROABS 8.9*  --  4.2  HGB 11.4* 11.0* 11.5*  HCT 37.0 35.7* 36.4  MCV 93.7 93.9 90.8  PLT 272 227 546    Basic Metabolic Panel:  Recent Labs  Lab 08/01/19 1037 08/01/19 1850 08/02/19 1128  NA 143  --  141  K 3.4*  --  3.7  CL 107  --  109  CO2 26  --  25  GLUCOSE 138*  --  85  BUN 27*  --  17  CREATININE 0.83 0.81 0.73  CALCIUM 8.8*  --  8.4*    Lipid Panel: No results found for: CHOL, TRIG, HDL, CHOLHDL, VLDL, LDLCALC HgbA1c: No results found for: HGBA1C Urine Drug Screen: No results found for: LABOPIA, COCAINSCRNUR, LABBENZ, AMPHETMU, THCU, LABBARB  Alcohol Level No results found for: ETH  IMAGING  CT Head Wo Contrast 08/01/2019 IMPRESSION:  1. Lucency with loss of gray-white differentiation is identified in the right occipital lobe suspicious for acute infarct.  2. No focal acute hemorrhage.  3. No acute fracture or dislocation of cervical spine.  4. Degenerative joint changes of cervical spine.  5. Multiple nodules identified in bilateral thyroid gland. This may be due to multinodular goiter.   CT Cervical Spine Wo Contrast 08/01/2019 IMPRESSION:  No acute fracture or dislocation of cervical spine.   MR MRA HEAD WO CONTRAST 08/02/2019 IMPRESSION:  1. Negative for large vessel occlusion in association with the Right PCA infarct, but positive for bilateral PCA moderate to severe irregularity (P1/P2 segment  junction on the right).  2. Anterior circulation atherosclerosis with no significant stenosis through the proximal 2nd order branches, although other MCA and ACA branch detail is degraded by motion.   MR BRAIN WO CONTRAST 08/01/2019 IMPRESSION:  1. Acute/subacute nonhemorrhagic right PCA territory infarct involving the right occipital lobe, splenium of the corpus callosum, and posterior right hippocampus.  2. Advanced atrophy and diffuse white matter disease likely reflects the sequela of chronic microvascular ischemia.  3. Remote hemorrhagic lacunar infarcts of the left basal ganglia and corona radiata.  4. Remote nonhemorrhagic lacunar infarcts of the cerebellum bilaterally.   DG CHEST PORT 1 VIEW 08/01/2019 IMPRESSION:  1. No acute cardiopulmonary process.  2. Cardiomegaly. No evidence of congestive heart failure.  3. Right hemidiaphragm eventration.    ECHOCARDIOGRAM COMPLETE 08/02/2019 IMPRESSIONS   1. Left ventricular ejection fraction, by visual estimation, is 60 to 65%. The left ventricle has normal function. There is mildly increased left ventricular hypertrophy.   2. The left ventricle has no regional wall motion abnormalities.   3. Global right ventricle has normal systolic function.The right ventricular size is normal. No increase in right ventricular wall thickness.   4. Left atrial size was severely dilated.   5. Right atrial size was normal.   6.  The mitral valve is normal in structure. Moderate mitral valve regurgitation. No evidence of mitral stenosis.   7. 2 separate mitral regurgitant jets.   8. The tricuspid valve is normal in structure.   9. The aortic valve is normal in structure. Aortic valve regurgitation is trivial. No evidence of aortic valve sclerosis or stenosis.  10. The pulmonic valve was normal in structure. Pulmonic valve regurgitation is trivial.  11. The inferior vena cava is normal in size with greater than 50% respiratory variability, suggesting  right atrial pressure of 3 mmHg.  12. No intracardiac thrombi or masses were visualized.    [facility] (at [facility]) 08/02/2019 Summary:  Right Carotid: Velocities in the right ICA are consistent with a 1-39% stenosis.  Left Carotid: Velocities in the left ICA are consistent with a 1-39% stenosis.  Vertebrals:  Bilateral vertebral arteries demonstrate antegrade flow.  Subclavians: Left subclavian artery was not visualized. Normal flow hemodynamics were seen in the right subclavian artery.  Preliminary     ECG - atrial fibrillation - ventricular response 71 BPM (See cardiology reading for complete details)    PHYSICAL EXAM  Temp:  [97.7 F (36.5 C)-98.1 F (36.7 C)] 98 F (36.7 C) (12/26 1700) Pulse Rate:  [72-91] 90 (12/26 1700) Resp:  [15-20] 18 (12/26 1000) BP: (134-156)/(72-94) 150/85 (12/26 1700) SpO2:  [96 %-99 %] 97 % (12/26 1700)  General - Well nourished, well developed, in no apparent distress.  Ophthalmologic - fundi not visualized due to noncooperation.  Cardiovascular - irregularly irregular heart rate and rhythm  Neuro - awake alert, eyes open, orientated to self and age, not orientated to place or time.  Respond with " I do not care" for all request.  Paucity of speech, not able to name or repeat.  Mild dysarthria.  Inconsistently blinking to visual threat bilaterally.  Tracking on both sides, no obvious hemianopia on exam.  PERRL, EOMI.  Facial symmetrical.  Moving bilateral upper symmetrically, 3/5.  Bilateral lower extremity withdraw to pain. Sensation, coordination and gait not tested.   ASSESSMENT/PLAN Ms. Lysle DingwallVirginia T Randall is a 85 y.o. female with history of frequent falls, dementia, Htn, and atrial fibrillation (not on anticoagulation) presenting after a fall at her nursing facility.   Stroke: Acute/subacute nonhemorrhagic right PCA territory infarct - embolic pattern, likely due to A. fib not on AC  Code Stroke CT Head - negative  CT head -  Lucency with loss of gray-white differentiation is identified in the right occipital lobe suspicious for acute infarct.   MRI head - Acute/subacute nonhemorrhagic right PCA territory infarct involving the right occipital lobe, splenium of the corpus callosum, and posterior right hippocampus.  Bilateral cerebellum old infarcts.  MRA head -positive for bilateral PCA moderate to severe irregularity, including P1/P2 segment junction on the right  Carotid Doppler - unremarkable  2D Echo - EF 60 - 65%. No cardiac source of emboli identified.  [employer] Corona Virus 2 - negative  LDL - pending  HgbA1c - pending  VTE prophylaxis - Lovenox  No antithrombotic prior to admission, now on aspirin 325 mg daily.  [facility] to [facility], [facility] pending palliative care for GOC discussion  Ongoing aggressive stroke risk factor management  Therapy recommendations:  pending  Disposition:  Pending  A. fib, chronic  Chronic A. fib not on AC due to frequent falls  Given current stroke, [facility]  However, frequent falls are concerning  Hold off AC at this time pending palliative care for GOC discussion  On aspirin  Dementia  Baseline dementia  Has been diagnosed with Alzheimer's disease in the past  On Exelon patch  Follow up palliative care for GOC discussion  Hypertension  Home BP meds: Diltiazem ; metoprolol  Current BP meds: none   Stable . Permissive hypertension (OK if < 220/120) but gradually normalize in 5-7 days  . Long-term BP goal normotensive  Dysphagia   Due to stroke and dementia  On dys 2 and thin liquid now  Speech on board  Encourage po intake  Other Stroke Risk Factors  Advanced age  Family hx stroke (father)  Previous strokes by imaging  Other Active Problems  Frequent falls  COVID infection 2-3 months ago as per RN - this admission COVID negative  Hospital day #  1  Marvel Plan, MD PhD Stroke Neurology 08/02/2019 7:25 PM   To contact Stroke Continuity provider, please refer to [email or web address]. After hours, contact General Neurology

## 2019-08-02 NOTE — Procedures (Signed)
Echo attempted. Patient not in room. 

## 2019-08-03 DIAGNOSIS — E876 Hypokalemia: Secondary | ICD-10-CM

## 2019-08-03 DIAGNOSIS — F0390 Unspecified dementia without behavioral disturbance: Secondary | ICD-10-CM

## 2019-08-03 LAB — GLUCOSE, CAPILLARY
Glucose-Capillary: 95 mg/dL (ref 70–99)
Glucose-Capillary: 133 mg/dL — ABNORMAL HIGH (ref 70–99)

## 2019-08-03 MED ORDER — METOPROLOL TARTRATE 25 MG PO TABS
25.0000 mg | ORAL_TABLET | Freq: Two times a day (BID) | ORAL | Status: DC
  Administered 2019-08-03 – 2019-08-06 (×7): 25 mg via ORAL
  Filled 2019-08-03 (×7): qty 1

## 2019-08-03 NOTE — Evaluation (Signed)
Occupational Therapy Evaluation Patient Details Name: Amy Randall MRN: [id number] DOB: [birth year] Today's Date: 08/03/2019    History of Present Illness 85 y.o. female admitted on 08/01/19 forfall with R head laceration and bruising.  COVID testing negative.  MRI showed acute/subacute non hemorrhagic R PCA territory infarct involving R occipital lobe, splenium of the corpus callosum and posterior R hippocampus.  Pt with significant PMH of A-fib, dementia, HTN, R femur IM nail (12/2018).     Clinical Impression   Pt was primarily using a w/c, but propelled it by herself. At times she walked with a RW or holding the rail in the hall. Pt could self feed, but was otherwise dependent. Pt presents with generalized weakness and baseline cognitive impairment. She performed better with encouragement of her daughter. Will follow acutely.    Follow Up Recommendations  (return to Memory Care facility)    Equipment Recommendations  None recommended by OT    Recommendations for Other Services       Precautions / Restrictions Precautions Precautions: Fall Precaution Comments: h/o multiple falls per chart      Mobility Bed Mobility Overal bed mobility: Needs Assistance Bed Mobility: Supine to Sit;Sit to Supine     Supine to sit: Mod assist Sit to supine: Mod assist   General bed mobility comments: pulled up on therapist's hand, assist to position hips at EOB, assist for LEs back into bed  Transfers                 General transfer comment: pt actively resisting up to stand    Balance Overall balance assessment: Needs assistance Sitting-balance support: Feet supported;No upper extremity supported Sitting balance-Leahy Scale: Fair Sitting balance - Comments: encouragement of daughter needed                                   ADL either performed or assessed with clinical judgement   ADL                                         General ADL  Comments: Pt needing assist to eat. Eating at bed level may be contributing.     Vision Patient Visual Report: No change from baseline Additional Comments: appears to have some depth perception deficits     Perception     Praxis      Pertinent Vitals/Pain Pain Assessment: Faces Faces Pain Scale: No hurt     Hand Dominance Right   Extremity/Trunk Assessment Upper Extremity Assessment Upper Extremity Assessment: Generalized weakness(but evenly using B UEs on bed rails, sheets)   Lower Extremity Assessment Lower Extremity Assessment: Defer to PT evaluation   Cervical / Trunk Assessment Cervical / Trunk Assessment: Kyphotic   Communication Communication Communication: Expressive difficulties(brief responses)   Cognition Arousal/Alertness: Awake/alert Behavior During Therapy: Flat affect Overall Cognitive Status: History of cognitive impairments - at baseline                                 General Comments: pt with advanced dementia per daughter   General Comments       Exercises     Shoulder Instructions      Home Living Family/patient expects to be discharged to:: Other (Comment)  Additional Comments: [facility] per daughter      Prior Functioning/Environment Level of Independence: Needs assistance  Gait / Transfers Assistance Needed: can use a [place] or 2 hands on rail at facility, but mostly paddles with her feet in a manual w/c ADL's / Homemaking Assistance Needed: self feeds, otherwise dependent, aware when she needs to have a BM, incontinent of bladder per daughter            OT Problem List: Impaired balance (sitting and/or standing);Impaired vision/perception;Decreased cognition;Decreased knowledge of use of DME or AE      OT Treatment/Interventions: Self-care/ADL training;Patient/family education;Therapeutic activities    OT Goals(Current goals can be found in the care plan  section) Acute Rehab OT Goals Patient Stated Goal: daughter wants pt to be able to self feed OT Goal Formulation: With family Time For Goal Achievement: 08/17/19 Potential to Achieve Goals: Fair ADL Goals Pt Will Perform Eating: with min assist;with set-up;sitting Additional ADL Goal #1: Pt will perform bed mobility with supervision for safety. Additional ADL Goal #2: Pt will transfer to [facility] and chair with min assist.  OT Frequency: Min 1X/week   Barriers to D/C:            Co-evaluation              AM-PAC OT "6 Clicks" Daily Activity     Outcome Measure Help from another person eating meals?: Total Help from another person taking care of personal grooming?: Total Help from another person toileting, which includes using toliet, bedpan, or urinal?: Total Help from another person bathing (including washing, rinsing, drying)?: Total Help from another person to put on and taking off regular upper body clothing?: Total Help from another person to put on and taking off regular lower body clothing?: Total 6 Click Score: 6   End of Session    Activity Tolerance: Patient tolerated treatment well Patient left: in bed;with call bell/phone within reach;with bed alarm set;with family/visitor present                   Time: 1610-9604 OT Time Calculation (min): 26 min Charges:  OT General Charges $OT Visit: 1 Visit OT Evaluation $OT Eval Moderate Complexity: 1 Mod OT Treatments $Self Care/Home Management : 8-22 mins  Martie Round, OTR/L Acute Rehabilitation Services Pager: [phone] Office: [phone]  [name] 08/03/2019, 4:47 PM

## 2019-08-03 NOTE — Progress Notes (Signed)
Patient HR spiking to 160s A-fib w/ RVR and sustaining 120-140s. [place] MD paged and made aware. Pt denies CP and SOB, but is confused. No new orders received at this time, other than to give early the 25mg  PO metoprolol ordered for 2200. Given now.

## 2019-08-03 NOTE — Progress Notes (Signed)
STROKE TEAM PROGRESS NOTE   INTERVAL HISTORY Daughter is at the bedside.  Patient lying in bed, in fetal position, and answering "I don't know" for all orientation questions.  Moving all extremities symmetrically, tracking bilateral sides.  As per daughter, patient lives in nursing home, most time in wheelchair, however, from time to time, she was able to use [place] walking in the hallway without supervision.  Sometimes at night, she has sundowning and walk with pushing her wheelchair.  And sometimes, she was able to hold onto handrail to walk in the hallway.  She had a frequent falls in the past with hip fracture and head injury.  Regarding the anticoagulation, daughter stated that she felt the patient do not want any further medications.  She stated that in the past she prepared oh patient medication put into pillbox but patient refused taking it.  However, in nursing home, patient was taking her medication from nursing staff.  She would like to discuss with her brothers in order to firmly believe that patient would not want any new medications.  She is also waiting to discuss with palliative care regarding patient [place].  OBJECTIVE Vitals:   08/02/19 1700 08/02/19 2326 08/03/19 0100 08/03/19 0800  BP: (!) 150/85 (!) 165/85  (!) 156/109  Pulse: 90 (!) 130    Resp:  15 16 15   Temp: 98 F (36.7 C) 97.7 F (36.5 C)  97.8 F (36.6 C)  TempSrc: Oral Oral  Axillary  SpO2: 97% 100%  100%    CBC:  Recent Labs  Lab 08/01/19 1037 08/01/19 1850 08/02/19 1128  WBC 10.2 8.2 5.4  NEUTROABS 8.9*  --  4.2  HGB 11.4* 11.0* 11.5*  HCT 37.0 35.7* 36.4  MCV 93.7 93.9 90.8  PLT 272 227 097    Basic Metabolic Panel:  Recent Labs  Lab 08/01/19 1037 08/01/19 1850 08/02/19 1128  NA 143  --  141  K 3.4*  --  3.7  CL 107  --  109  CO2 26  --  25  GLUCOSE 138*  --  85  BUN 27*  --  17  CREATININE 0.83 0.81 0.73  CALCIUM 8.8*  --  8.4*    Lipid Panel: No results found for: CHOL, TRIG, HDL,  CHOLHDL, VLDL, LDLCALC HgbA1c: No results found for: HGBA1C Urine Drug Screen: No results found for: LABOPIA, COCAINSCRNUR, LABBENZ, AMPHETMU, THCU, LABBARB  Alcohol Level No results found for: ETH  IMAGING  CT Head Wo Contrast 08/01/2019 IMPRESSION:  1. Lucency with loss of gray-white differentiation is identified in the right occipital lobe suspicious for acute infarct.  2. No focal acute hemorrhage.  3. No acute fracture or dislocation of cervical spine.  4. Degenerative joint changes of cervical spine.  5. Multiple nodules identified in bilateral thyroid gland. This may be due to multinodular goiter.   CT Cervical Spine Wo Contrast 08/01/2019 IMPRESSION:  No acute fracture or dislocation of cervical spine.   MR MRA HEAD WO CONTRAST 08/02/2019 IMPRESSION:  1. Negative for large vessel occlusion in association with the Right PCA infarct, but positive for bilateral PCA moderate to severe irregularity (P1/P2 segment junction on the right).  2. Anterior circulation atherosclerosis with no significant stenosis through the proximal 2nd order branches, although other MCA and ACA branch detail is degraded by motion.   MR BRAIN WO CONTRAST 08/01/2019 IMPRESSION:  1. Acute/subacute nonhemorrhagic right PCA territory infarct involving the right occipital lobe, splenium of the corpus callosum, and posterior right hippocampus.  2. Advanced atrophy and diffuse white matter disease likely reflects the sequela of chronic microvascular ischemia.  3. Remote hemorrhagic lacunar infarcts of the left basal ganglia and corona radiata.  4. Remote nonhemorrhagic lacunar infarcts of the cerebellum bilaterally.   DG CHEST PORT 1 VIEW 08/01/2019 IMPRESSION:  1. No acute cardiopulmonary process.  2. Cardiomegaly. No evidence of congestive heart failure.  3. Right hemidiaphragm eventration.    ECHOCARDIOGRAM COMPLETE 08/02/2019 IMPRESSIONS   1. Left ventricular ejection fraction, by visual  estimation, is 60 to 65%. The left ventricle has normal function. There is mildly increased left ventricular hypertrophy.   2. The left ventricle has no regional wall motion abnormalities.   3. Global right ventricle has normal systolic function.The right ventricular size is normal. No increase in right ventricular wall thickness.   4. Left atrial size was severely dilated.   5. Right atrial size was normal.   6. The mitral valve is normal in structure. Moderate mitral valve regurgitation. No evidence of mitral stenosis.   7. 2 separate mitral regurgitant jets.   8. The tricuspid valve is normal in structure.   9. The aortic valve is normal in structure. Aortic valve regurgitation is trivial. No evidence of aortic valve sclerosis or stenosis.  10. The pulmonic valve was normal in structure. Pulmonic valve regurgitation is trivial.  11. The inferior vena cava is normal in size with greater than 50% respiratory variability, suggesting right atrial pressure of 3 mmHg.  12. No intracardiac thrombi or masses were visualized.    VAS US CAROTID (at [facility] and [facility] only) 08/02/2019 Summary:  Right Carotid: Velocities in the right ICA are consistent with a 1-39% stenosis.  Left Carotid: Velocities in the left ICA are consistent with a 1-39% stenosis.  Vertebrals:  Bilateral vertebral arteries demonstrate antegrade flow.  Subclavians: Left subclavian artery was not visualized. Normal flow hemodynamics were seen in the right subclavian artery.  Preliminary     ECG - atrial fibrillation - ventricular response 71 BPM (See cardiology reading for complete details)    PHYSICAL EXAM  Temp:  [97.7 F (36.5 C)-98 F (36.7 C)] 97.8 F (36.6 C) (12/27 0800) Pulse Rate:  [90-130] 130 (12/26 2326) Resp:  [15-16] 15 (12/27 0800) BP: (150-165)/(85-109) 156/109 (12/27 0800) SpO2:  [97 %-100 %] 100 % (12/27 0800)  General - Well nourished, well developed, in no apparent distress.  Ophthalmologic - fundi not  visualized due to noncooperation.  Cardiovascular - irregularly irregular heart rate and rhythm  Neuro - awake alert, lying in bed, in fetal position, eyes open, answered "I do not know" for all orientation questions.  Paucity of speech, not able to name or repeat.  Mild dysarthria.  Inconsistently blinking to visual threat bilaterally.  Tracking on both sides, no obvious hemianopia on exam.  PERRL, EOMI.  Facial symmetrical.  Moving bilateral upper symmetrically, 3/5.  Bilateral lower extremity withdraw to pain, 2/5. Sensation, coordination and gait not tested.   ASSESSMENT/PLAN Amy Randall is a 85 y.o. female with history of frequent falls, dementia, hypertension, and atrial fibrillation (not on anticoagulation) presenting after a fall at her nursing facility.   Stroke: Acute/subacute nonhemorrhagic right PCA territory infarct - embolic pattern, likely due to A. fib not on AC  Code Stroke CT Head - negative  CT head - Lucency with loss of gray-white differentiation is identified in the right occipital lobe suspicious for acute infarct.   MRI head - Acute/subacute nonhemorrhagic right PCA territory infarct involving the  right occipital lobe, splenium of the corpus callosum, and posterior right hippocampus.  Bilateral cerebellum old infarcts.  MRA head -positive for bilateral PCA moderate to severe irregularity, including P1/P2 segment junction on the right  Carotid Doppler - unremarkable  2D Echo - EF 60 - 65%. No cardiac source of emboli identified.  [employer] Corona Virus 2 - negative  LDL - pending  HgbA1c - pending  VTE prophylaxis - Lovenox  No antithrombotic prior to admission, now on aspirin 325 mg daily.  [facility] to [facility], [facility] pending palliative care for GOC discussion  Ongoing aggressive stroke risk factor management  Therapy recommendations:  SNF  Disposition:  Pending  A. fib, chronic  Chronic A. fib not on AC due  to frequent falls  Given current stroke, [facility]  However, frequent falls are concerning  Hold off AC at this time pending palliative care for GOC discussion  On aspirin  Resume beta blocker  Dementia  Baseline not orientated, advanced dementia  Has been diagnosed with Alzheimer's disease in the past  On Ex[employer] patch  Follow up palliative care for GOC discussion  Hypertension  Home BP meds: Diltiazem ; metoprolol  Current BP meds: none   Stable - resumed metoprolol . Gradually normalize in 3-5 days  . Long-term BP goal normotensive  Dysphagia   Due to stroke and dementia  On dys 2 and thin liquid now  Speech on board  Encourage po intake  Other Stroke Risk Factors  Advanced age  Family hx stroke (father)  Previous strokes by imaging  Other Active Problems  Frequent falls  COVID infection 2-3 months ago as per RN - this admission COVID negative  Hospital day # 2  I spent  35 minutes in total face-to-face time with the patient, more than 50% of which was spent in counseling and coordination of care, reviewing test results, images and medication, and discussing the diagnosis, treatment plan and potential prognosis. This patient's care requiresreview of multiple databases, neurological assessment, discussion with family, other specialists and medical decision making of high complexity. I had long discussion with daughter at bedside, updated pt current condition, treatment plan and potential prognosis, and answered all the questions. She expressed understanding and appreciation.   Marvel PlanJindong Andray Assefa, MD PhD Stroke Neurology 08/03/2019 4:35 PM   To contact Stroke Continuity provider, please refer to [email or web address]. After hours, contact General Neurology

## 2019-08-03 NOTE — Evaluation (Signed)
Physical Therapy Evaluation Patient Details Name: Amy Randall MRN: [id number] DOB: [birth year] Today's Date: 08/03/2019   History of Present Illness  85 y.o. female admitted on 08/01/19 forfall with R head laceration and bruising.  COVID testing negative.  MRI showed acute/subacute non hemorrhagic R PCA territory infarct involving R occipital lobe, splenium of the corpus callosum and posterior R hippocampus.  Pt with significant PMH of A-fib, dementia, HTN, R femur IM nail (12/2018).    Clinical Impression  Pt was more alert EOB with me, but was unable to stay there long reporting lightheadedness and pushing posteriorly to return to supine.  She spontaneously moves legs bil without significant sign of strength difference L vs R, but difficult to tell as she will not follow commands to move arms or legs at this time.  HR increased to 127 in A-fib just sitting momentarily EOB.  Pt will likely need higher level of care if she is from ALF.  She will likely need SNF placement for rehab at discharge.  PT to follow acutely for deficits listed below.      Follow Up Recommendations SNF    Equipment Recommendations  Hospital bed;Wheelchair (measurements PT);Wheelchair cushion (measurements PT)    Recommendations for Other Services   NA    Precautions / Restrictions Precautions Precautions: Fall Precaution Comments: h/o multiple falls per chart      Mobility  Bed Mobility Overal bed mobility: Needs Assistance Bed Mobility: Supine to Sit;Sit to Supine     Supine to sit: Total assist Sit to supine: Total assist   General bed mobility comments: Total assist to move to EOB, pt not initiating movement to help even with PT initiating movement.    Transfers                 General transfer comment: NT, pt pushing posteriorly in sitting wanting to return to bed, reporting lightheadedness.  Max HR observed 127 in A-fib.          Balance Overall balance assessment: Needs  assistance Sitting-balance support: Feet supported;No upper extremity supported Sitting balance-Leahy Scale: Zero Sitting balance - Comments: total assist EOB due to posterior pushing.  Postural control: Posterior lean                                   Pertinent Vitals/Pain Pain Assessment: Faces Faces Pain Scale: No hurt    Home Living Family/patient expects to be discharged to:: Unsure                 Additional Comments: SNF vs ALF    Prior Function           Comments: per chart, pt is ambulatory, but falls frequently.         Extremity/Trunk Assessment   Upper Extremity Assessment Upper Extremity Assessment: Defer to OT evaluation    Lower Extremity Assessment Lower Extremity Assessment: Generalized weakness(no obvious asymmetry, moving bil LEs spontaneously )    Cervical / Trunk Assessment Cervical / Trunk Assessment: Kyphotic  Communication   Communication: Other (comment)(too lethargic)  Cognition Arousal/Alertness: Lethargic Behavior During Therapy: Anxious Overall Cognitive Status: Impaired/Different from baseline Area of Impairment: Orientation;Attention;Memory;Following commands;Safety/judgement;Awareness;Problem solving                 Orientation Level: Disoriented to;Person;Place;Time;Situation Current Attention Level: Focused Memory: Decreased short-term memory Following Commands: Follows one step commands inconsistently Safety/Judgement: Decreased awareness of safety;Decreased awareness of deficits  Awareness: Intellectual Problem Solving: Decreased initiation;Difficulty sequencing;Requires verbal cues;Requires tactile cues General Comments: Pt lethargic in the bed, perked up seated EOB, but very anxious, HR increased and pt reporting lightheadedness, eyes open momentarily and sepaking more to me EOB than in bed.              Assessment/Plan    PT Assessment Patient needs continued PT services  PT Problem List  Decreased strength;Decreased activity tolerance;Decreased balance;Decreased mobility;Decreased cognition;Decreased knowledge of use of DME;Decreased safety awareness;Decreased knowledge of precautions;Cardiopulmonary status limiting activity       PT Treatment Interventions DME instruction;Gait training;Functional mobility training;Therapeutic activities;Therapeutic exercise;Balance training;Neuromuscular re-education;Cognitive remediation;Patient/family education    PT Goals (Current goals can be found in the Care Plan section)  Acute Rehab PT Goals Patient Stated Goal: unable to state, wanted to return to bed PT Goal Formulation: Patient unable to participate in goal setting Time For Goal Achievement: 08/17/19 Potential to Achieve Goals: Fair    Frequency Min 3X/week           AM-PAC PT "6 Clicks" Mobility  Outcome Measure Help needed turning from your back to your side while in a flat bed without using bedrails?: Total Help needed moving from lying on your back to sitting on the side of a flat bed without using bedrails?: Total Help needed moving to and from a bed to a chair (including a wheelchair)?: Total Help needed standing up from a chair using your arms (e.g., wheelchair or bedside chair)?: Total Help needed to walk in hospital room?: Total Help needed climbing 3-5 steps with a railing? : Total 6 Click Score: 6    End of Session   Activity Tolerance: Patient limited by fatigue;Patient limited by lethargy Patient left: in bed;with call bell/phone within reach   PT Visit Diagnosis: Muscle weakness (generalized) (M62.81);Difficulty in walking, not elsewhere classified (R26.2);Other symptoms and signs involving the nervous system (R29.898)    Time: 1540-0867 PT Time Calculation (min) (ACUTE ONLY): 12 min   Charges:           Verdene Lennert, PT, DPT  Acute Rehabilitation [phone] pager #[phone] office  @ [facility]: [phone]   PT  Evaluation $PT Eval Moderate Complexity: 1 Mod          08/03/2019, 11:23 AM

## 2019-08-03 NOTE — Progress Notes (Signed)
[facility] Progress Note  Patient: Amy DingwallVirginia T Randall ZOX:[id number]   PCP: Kari BaarsHawkins, Edward, MD DOB: September 07, 1933   DOA: 08/01/2019   DOS: 08/03/2019   Date of Service: the patient was seen and examined on 08/03/2019  Chief complaint Unwitnessed fall  Brief hospital course: [place] is a 85 y.o. female with medical history significant for dementia, atrial fibrillation, hypertension, hyperthyroidism sent from nursing home due to fall.Patient is unable to get history obtained from the ER chart ED physician and from patient's daughter over the phone.Patient was found found on the floor by the staff with right forehead laceration and some bleeding.At baseline she is alert to person only,and has been having acutely progressive mental decline for the past 2 years and has had frequent falls at the facility.No report of fever,respiratory distress or vomiting or diarrhea.   In ED: Blood pressure stable, heart rate In A fib rate controlled , saturating 99% on room air, low temperature 97.4, lab work with potassium 3.4, BUN 27, troponin VI, WBC stable,rapid Covid antigen negative but Covid PCR pending.  Initially had CT head and CT C-spine subsequently MRI that showed acute/subacute nonhemorrhagic right PCA territory infarct involving the right occipital lobe, splenium of the corpus callosum, and posterior right hippocampus and advanced atrophy and diffuse white matter disease,Remote hemorrhagic lacunar infarcts of the left basal ganglia and corona radiata Remote nonhemorrhagic lacunar infarcts of the cerebellum bilaterally."  Patient received Versed prior to MRI, on my evaluation patient is lethargic able to withdraw right leg to pain, on room air saturating well.  Dr. Amada JupiterKirkpatrick from neurology was consulted [facility] stroke work[facility]up  08/03/2019: Patient seen alongside a physical therapist.  Patient seems not eager to speak to us today.  Patient also a bit sleepy.   Apparently, patient just tried some physical therapy with associated significant tachycardia/A. fib RVR.  Rate went up to the 130s.  Systolic blood pressures 156 mmHg.  Otherwise, no new changes.  Subjective: No history from patient.  Assessment and Plan: Scheduled Meds: .  stroke: mapping our early stages of recovery book   Does not apply Once  . aspirin  300 mg Rectal Daily   Or  . aspirin  325 mg Oral Daily  . enoxaparin (LOVENOX) injection  40 mg Subcutaneous Q24H  . rivastigmine  13.3 mg Transdermal Daily   Continuous Infusions: . 0.9 % NaCl with KCl 20 mEq / L 75 mL/hr at 08/03/19 0814   PRN Meds: acetaminophen **OR** acetaminophen (TYLENOL) oral liquid 160 mg/5 mL **OR** acetaminophen  Acute/subacute nonhemorrhagic right PCA territory infarct involving the right occipital lobe, splenium of the corpus callosum, and posterior right hippocampus :  -Neurology is directing care.   -Currently on aspirin suppository. -PT OT input is appreciated.   -Patient has advanced dementia, and was seen in a memory care unit.  -Speech therapy input is appreciated, dysphagia 2 diet recommended. -We will consult the palliative care team.  There is need to discuss goal of care and CODE STATUS.   -Continue to monitor heart rate and blood pressure.   -Continue permissive hypertension.   -Further management will depend on hospital course.   Hypokalemia:  -Monitor. -Patient is currently on normal saline with KCl.  Concern for Covid infection. Daughter is concerned that patient may have Covid infection in November. ED as well as nursing home is concerned that the patient may have a Covid infection as well. [facility] which  actually has Covid positive patients. Patient's Covid test is negative as well as Covid antibody test is negative and does not have any respiratory illness on examination nor has any evidence of infiltrates on the chest x-ray therefore I will not  give isolating the patient.  08/03/2019: Covid tests have come back negative.   Fall at nursing home with laceration, Unwitnessed. Apparently patient does have multiple falls at home. Because of that reason patient is not a candidate for anticoagulation for A. fib. CT head and MRI noted as above.  Advanced dementia: Continue supportive care fall precaution.  Palliative care consult  Atrial fibrillation likely permanent,known history, noted in  EKG, rate is stable at times slow ventricuar rate. Not on anticoagulation,at nursing home,due to her frequent falls.  Hypertension:Blood Pressure well controlled hold Cardizem allow permissive hypertension.  Hyperthyroidism on methimazole -resume once able to take p.o.  Goals of care:  Consult palliative care team.    Diet: Dysphagia type 2  DVT Prophylaxis: Subcutaneous Lovenox   Advance goals of care discussion: Full code  Family Communication:   Disposition:  Discharge to be determined.  Consultants: Palliative care, neurology Procedures: Echocardiogram   Antibiotics: Anti-infectives (From admission, onward)   None       Objective: Physical Exam: Vitals:   08/02/19 1700 08/02/19 2326 08/03/19 0100 08/03/19 0800  BP: (!) 150/85 (!) 165/85  (!) 156/109  Pulse: 90 (!) 130    Resp:  Temp: 98 F (36.7 C) 97.7 F (36.5 C)  97.8 F (36.6 C)  TempSrc: Oral Oral  Axillary  SpO2: 97% 100%  100%   No intake or output data in the 24 hours ending 08/03/19 1102 There were no vitals filed for this visit. General: Sleepy.  Not in any distress.   Eyes: No jaundice.  Patient is pale.   Cardiovascular: S1 and S2   Respiratory: Clear to auscultation  Abdomen: Bowel Sound present, Soft and no tenderness, no hernia Extremities: no Pedal edema Neurologic: Sleepy.  Patient will not follow complete neuro exam.  CBC: Recent Labs  Lab 08/01/19 1037 08/01/19 1850 08/02/19 1128  WBC 10.2 8.2 5.4  NEUTROABS 8.9*  --   4.2  HGB 11.4* 11.0* 11.5*  HCT 37.0 35.7* 36.4  MCV 93.7 93.9 90.8  PLT 272 227 218   Basic Metabolic Panel: Recent Labs  Lab 08/01/19 1037 08/01/19 1850 08/02/19 1128  NA 143  --  141  K 3.4*  --  3.7  CL 107  --  109  CO2 26  --  25  GLUCOSE 138*  --  85  BUN 27*  --  17  CREATININE 0.83 0.81 0.73  CALCIUM 8.8*  --  8.4*    Liver Function Tests: No results for input(s): AST, ALT, ALKPHOS, BILITOT, PROT, ALBUMIN in the last 168 hours. No results for input(s): LIPASE, AMYLASE in the last 168 hours. No results for input(s): AMMONIA in the last 168 hours. Coagulation Profile: No results for input(s): INR, PROTIME in the last 168 hours. Cardiac Enzymes: No results for input(s): CKTOTAL, CKMB, CKMBINDEX, TROPONINI in the last 168 hours. BNP (last 3 results) No results for input(s): PROBNP in the last 8760 hours. CBG: No results for input(s): GLUCAP in the last 168 hours. Studies: ECHOCARDIOGRAM COMPLETE  Result Date: 08/02/2019   ECHOCARDIOGRAM REPORT   Patient Name:   LASHELLE KOY Date of Exam: 08/02/2019 Medical Rec #:  [id number]         Height:  61.0 in Accession #:    [id number]        Weight:       100.0 lb Date of Birth:  Jan 24, 1934         BSA:          1.41 m Patient Age:    85 years          BP:           156/94 mmHg Patient Gender: F                 HR:           81 bpm. Exam Location:  Inpatient Procedure: 2D Echo Indications:    Stroke 434.91/I163.9  History:        Patient has prior history of Echocardiogram examinations, most                 recent 01/02/2019. Arrythmias:Atrial Fibrillation; Risk                 Factors:Hypertension.  Sonographer:    Ross Ludwig RDCS (AE) Referring Phys: [id number] [facility]  Sonographer Comments: Patient unresponsive when asked to sniff. IMPRESSIONS  1. Left ventricular ejection fraction, by visual estimation, is 60 to 65%. The left ventricle has normal function. There is mildly increased left ventricular hypertrophy.  2. The  left ventricle has no regional wall motion abnormalities.  3. Global right ventricle has normal systolic function.The right ventricular size is normal. No increase in right ventricular wall thickness.  4. Left atrial size was severely dilated.  5. Right atrial size was normal.  6. The mitral valve is normal in structure. Moderate mitral valve regurgitation. No evidence of mitral stenosis.  7. 2 separate mitral regurgitant jets.  8. The tricuspid valve is normal in structure.  9. The aortic valve is normal in structure. Aortic valve regurgitation is trivial. No evidence of aortic valve sclerosis or stenosis. 10. The pulmonic valve was normal in structure. Pulmonic valve regurgitation is trivial. 11. The inferior vena cava is normal in size with greater than 50% respiratory variability, suggesting right atrial pressure of 3 mmHg. 12. No intracardiac thrombi or masses were visualized. FINDINGS  Left Ventricle: Left ventricular ejection fraction, by visual estimation, is 60 to 65%. The left ventricle has normal function. The left ventricle has no regional wall motion abnormalities. There is mildly increased left ventricular hypertrophy. Normal left atrial pressure. Right Ventricle: The right ventricular size is normal. No increase in right ventricular wall thickness. Global RV systolic function is has normal systolic function. Left Atrium: Left atrial size was severely dilated. Right Atrium: Right atrial size was normal in size Pericardium: There is no evidence of pericardial effusion. Mitral Valve: The mitral valve is normal in structure. There is mild thickening of the mitral valve leaflet(s). Moderate mitral valve regurgitation. No evidence of mitral valve stenosis by observation. 2 separate mitral regurgitant jets. Tricuspid Valve: The tricuspid valve is normal in structure. Tricuspid valve regurgitation is mild. Aortic Valve: The aortic valve is normal in structure. Aortic valve regurgitation is trivial. The aortic  valve is structurally normal, with no evidence of sclerosis or stenosis. Aortic valve mean gradient measures 3.0 mmHg. Aortic valve peak gradient measures 5.2 mmHg. Aortic valve area, by VTI measures 1.51 cm. Pulmonic Valve: The pulmonic valve was normal in structure. Pulmonic valve regurgitation is trivial. Pulmonic regurgitation is trivial. Aorta: The aortic root, ascending aorta and aortic arch are all structurally normal, with no evidence of dilitation  or obstruction. Venous: The inferior vena cava is normal in size with greater than 50% respiratory variability, suggesting right atrial pressure of 3 mmHg. IAS/Shunts: No atrial level shunt detected by color flow Doppler. There is no evidence of a patent foramen ovale. No ventricular septal defect is seen or detected. There is no evidence of an atrial septal defect. Additional Comments: No intracardiac thrombi or masses were visualized.  LEFT VENTRICLE PLAX 2D LVIDd:         4.10 cm LVIDs:         3.00 cm LV PW:         1.20 cm LV IVS:        1.20 cm LVOT diam:     1.70 cm LV SV:         39 ml LV SV Index:   28.15 LVOT Area:     2.27 cm  RIGHT VENTRICLE            IVC RV Basal diam:  2.50 cm    IVC diam: 2.00 cm RV S prime:     8.27 cm/s TAPSE (M-mode): 1.2 cm LEFT ATRIUM              Index       RIGHT ATRIUM           Index LA diam:        4.80 cm  3.41 cm/m  RA Area:     15.60 cm LA Vol (A2C):   106.0 ml 75.35 ml/m RA Volume:   32.60 ml  23.17 ml/m LA Vol (A4C):   79.5 ml  56.51 ml/m LA Biplane Vol: 96.9 ml  68.88 ml/m  AORTIC VALVE AV Area (Vmax):    1.59 cm AV Area (Vmean):   1.47 cm AV Area (VTI):     1.51 cm AV Vmax:           113.92 cm/s AV Vmean:          80.680 cm/s AV VTI:            0.220 m AV Peak Grad:      5.2 mmHg AV Mean Grad:      3.0 mmHg LVOT Vmax:         80.02 cm/s LVOT Vmean:        52.280 cm/s LVOT VTI:          0.147 m LVOT/AV VTI ratio: 0.67  AORTA Ao Root diam: 2.80 cm MR Peak grad:    158.3 mmHg  TRICUSPID VALVE MR Mean grad:     100.0 mmHg  TR Peak grad:   15.4 mmHg MR Vmax:         629.00 cm/s TR Vmax:        230.00 cm/s MR Vmean:        467.0 cm/s MR PISA:         1.57 cm    SHUNTS MR PISA Eff ROA: 8 mm       Systemic VTI:  0.15 m MR PISA Radius:  0.50 cm     Systemic Diam: 1.70 cm  Candee Furbish MD Electronically signed by Candee Furbish MD Signature Date/Time: 08/02/2019/2:37:12 PM    Final    VAS US CAROTID (at [facility] and [facility] only)  Result Date: 08/02/2019 Carotid Arterial Duplex Study Indications: CVA. Performing Technologist: Maudry Mayhew MHA, RDMS, RVT, RDCS  Examination Guidelines: A complete evaluation includes B-mode imaging, spectral Doppler, color Doppler, and power Doppler as needed of all  accessible portions of each vessel. Bilateral testing is considered an integral part of a complete examination. Limited examinations for reoccurring indications may be performed as noted.  Right Carotid Findings: +----------+--------+--------+--------+--------------------------+--------+           PSV cm/sEDV cm/sStenosisPlaque Description        Comments +----------+--------+--------+--------+--------------------------+--------+ CCA Prox  40      7                                                  +----------+--------+--------+--------+--------------------------+--------+ CCA Distal31      8               focal and calcific                 +----------+--------+--------+--------+--------------------------+--------+ ICA Prox  27      8               heterogenous and irregular         +----------+--------+--------+--------+--------------------------+--------+ ICA Distal69      20                                                 +----------+--------+--------+--------+--------------------------+--------+ ECA       41      6                                                  +----------+--------+--------+--------+--------------------------+--------+  +----------+--------+-------+----------------+-------------------+           PSV cm/sEDV cmsDescribe        Arm Pressure (mmHG) +----------+--------+-------+----------------+-------------------+ [id number]             Multiphasic, WNL                    +----------+--------+-------+----------------+-------------------+ +---------+--------+--+--------+-+---------+ VertebralPSV cm/s24EDV cm/s8Antegrade +---------+--------+--+--------+-+---------+  Left Carotid Findings: +----------+--------+--------+--------+--------------------------+--------+           PSV cm/sEDV cm/sStenosisPlaque Description        Comments +----------+--------+--------+--------+--------------------------+--------+ CCA Prox  54      14              heterogenous and irregular         +----------+--------+--------+--------+--------------------------+--------+ CCA Distal48      14              heterogenous and irregular         +----------+--------+--------+--------+--------------------------+--------+ ICA Prox  36      11              heterogenous and irregular         +----------+--------+--------+--------+--------------------------+--------+ ICA Distal55      12                                                 +----------+--------+--------+--------+--------------------------+--------+ ECA       51      8               heterogenous and irregular         +----------+--------+--------+--------+--------------------------+--------+ +----------+--------+--------+--------------+-------------------+  PSV cm/sEDV cm/sDescribe      Arm Pressure (mmHG) +----------+--------+--------+--------------+-------------------+ Subclavian                Not identified                    +----------+--------+--------+--------------+-------------------+ +---------+--------+--+--------+-+---------+ VertebralPSV cm/s31EDV cm/s8Antegrade +---------+--------+--+--------+-+---------+   Summary: Right Carotid: Velocities in the right ICA are consistent with a 1-39% stenosis. Left Carotid: Velocities in the left ICA are consistent with a 1-39% stenosis. Vertebrals:  Bilateral vertebral arteries demonstrate antegrade flow. Subclavians: Left subclavian artery was not visualized. Normal flow hemodynamics              were seen in the right subclavian artery. *See table(s) above for measurements and observations.     Preliminary      Time spent: 35 minutes  Author: Berton Mount, MD [facility] 08/03/2019 11:02 AM  To reach On-call, see care teams to locate the attending and reach out to them via [email or web address]. If 7PM-7AM, please contact night-coverage If you still have difficulty reaching the attending provider, please page the [facility] (Director on Call) for [facility] on amion for assistance.

## 2019-08-04 DIAGNOSIS — I4891 Unspecified atrial fibrillation: Secondary | ICD-10-CM

## 2019-08-04 LAB — RENAL FUNCTION PANEL
Albumin: 2.5 g/dL — ABNORMAL LOW (ref 3.5–5.0)
Anion gap: 9 (ref 5–15)
BUN: 11 mg/dL (ref 8–23)
CO2: 24 mmol/L (ref 22–32)
Calcium: 8.5 mg/dL — ABNORMAL LOW (ref 8.9–10.3)
Chloride: 106 mmol/L (ref 98–111)
Creatinine, Ser: 0.83 mg/dL (ref 0.44–1.00)
GFR calc Af Amer: >60 mL/min (ref >60)
GFR calc non Af Amer: >60 mL/min (ref >60)
Glucose, Bld: 109 mg/dL — ABNORMAL HIGH (ref 70–99)
Phosphorus: 3.1 mg/dL (ref 2.5–4.6)
Potassium: 3.6 mmol/L (ref 3.5–5.1)
Sodium: 139 mmol/L (ref 135–145)

## 2019-08-04 LAB — CBC WITH DIFFERENTIAL/PLATELET
Abs Immature Granulocytes: 0.07 10*3/uL (ref 0.00–0.07)
Basophils Absolute: 0.1 10*3/uL (ref 0.0–0.1)
Basophils Relative: 1 %
Eosinophils Absolute: 0.3 10*3/uL (ref 0.0–0.5)
Eosinophils Relative: 4 %
HCT: 38.5 % (ref 36.0–46.0)
Hemoglobin: 12.4 g/dL (ref 12.0–15.0)
Immature Granulocytes: 1 %
Lymphocytes Relative: 12 %
Lymphs Abs: 1 10*3/uL (ref 0.7–4.0)
MCH: 28.8 pg (ref 26.0–34.0)
MCHC: 32.2 g/dL (ref 30.0–36.0)
MCV: 89.3 fL (ref 80.0–100.0)
Monocytes Absolute: 0.6 10*3/uL (ref 0.1–1.0)
Monocytes Relative: 7 %
Neutro Abs: 6.4 10*3/uL (ref 1.7–7.7)
Neutrophils Relative %: 75 %
Platelets: 249 10*3/uL (ref 150–400)
RBC: 4.31 MIL/uL (ref 3.87–5.11)
RDW: 13.5 % (ref 11.5–15.5)
WBC: 8.4 10*3/uL (ref 4.0–10.5)
nRBC: 0 % (ref 0.0–0.2)

## 2019-08-04 LAB — LIPID PANEL
Cholesterol: 196 mg/dL (ref 0–200)
HDL: 34 mg/dL — ABNORMAL LOW (ref >40)
LDL Cholesterol: 139 mg/dL — ABNORMAL HIGH (ref 0–99)
Total CHOL/HDL Ratio: 5.8 RATIO
Triglycerides: 115 mg/dL (ref <150)
VLDL: 23 mg/dL (ref 0–40)

## 2019-08-04 LAB — GLUCOSE, CAPILLARY
Glucose-Capillary: 104 mg/dL — ABNORMAL HIGH (ref 70–99)
Glucose-Capillary: 118 mg/dL — ABNORMAL HIGH (ref 70–99)
Glucose-Capillary: 96 mg/dL (ref 70–99)

## 2019-08-04 LAB — MAGNESIUM: Magnesium: 1.9 mg/dL (ref 1.7–2.4)

## 2019-08-04 LAB — HEMOGLOBIN A1C
Hgb A1c MFr Bld: 4.9 % (ref 4.8–5.6)
Mean Plasma Glucose: 93.93 mg/dL

## 2019-08-04 NOTE — Progress Notes (Signed)
Palliative Care Follow-up Note  Spoke at length with patient's daughter re: goals of care.   Summary:  1. DNR-we discussed the MOST for no CPR, LIMITED interventions but not full comfort care yet. Will sign the MOST form prior to discharge.  2. Patient's daughter wants to see what her new baseline is and is awaiting PT/OT evaluation to see if her mother can even get OOB or what her mobility is currently.She understands the progression of dementia and wants to determine what the impact of the stroke has and will be on her.  3. For now the goal is still mostly supportive care and she is open to going back to her memory care unit with hospice care when ready for discharge. Ok to go ahead and place a hospice referral.  4. No anticoagulation, continue other meds including exelon, treat afib with oral meds to control rate when possible.  Will continue to follow and assist with goals-it is most important for her daughter to see where her mothers baseline is currently especially related to functional status in terms of medical decision making.  Lane Hacker, DO Palliative Medicine [phone]  Time: 35 minutes Greater than 50%  of this time was spent counseling and coordinating care related to the above assessment and plan.

## 2019-08-04 NOTE — Progress Notes (Signed)
PROGRESS NOTE    Amy Randall  NWG:[id number] DOB: September 29, 1933 DOA: 08/01/2019 PCP: Sinda Du, MD   Brief Narrative:  85 year old lady with prior history of dementia hypertension, atrial fibrillation not on anticoagulation presents with a fall at the nursing facility    Assessment & Plan:   Active Problems:   Stroke [facility])   Acute/subacute nonhemorrhagic right PCA territory infarct Probably embolic in nature secondary to atrial fibrillation CT head suspicious for right occipital lobe infarct. MRI shows acute/subacute nonhemorrhagic right PCA infarct involving the right occipital lobe, splenium of corpus callosum and posterior right hippocampus.  Bilateral cerebellum old infarcts are present. MRI of the head positive for bilateral PCA moderate to severe irregularity. Echocardiogram showed left ventricular ejection fraction of 60 to 65% without any cardiac source of emboli identified. Carotid duplex was unremarkable. Currently on aspirin 325 mg daily LDL and hemoglobin A1c are pending. Therapy evaluations are pending      Atrial fibrillation Chronic but not on anticoagulation due to frequent falls Rate control with metoprolol 25 mg twice daily Continue with aspirin 325 mg.     Hypertensive urgency Continue with metoprolol gradually normalize blood pressure in 3 to 5 days     Dysphagia secondary to stroke and dementia SLP evaluation recommended dysphagia 2 diet with thin liquids.   Dementia No agitation.   Disposition:  Palliative care consulted, plan for hospice on discharge.  Supportive care at this time. Awaiting therapy evaluations.     DVT prophylaxis: Lovenox Code Status: Full code Family Communication: (None at bedside Disposition Plan: Pending palliative care consult  Consultants:   Palliative care  Neurology  Procedures: MRI of the brain Echocardiogram  Antimicrobials: None  Subjective: Patient is confused, not in any  distress.  Objective: Vitals:   08/03/19 1656 08/03/19 1722 08/03/19 2243 08/04/19 0752  BP: (!) 136/99 (!) 143/106 (!) 131/100 (!) 181/127  Pulse: (!) 101  (!) 51 (!) 56  Resp: 16  18 16   Temp: 98.2 F (36.8 C)  97.7 F (36.5 C) (!) 97.5 F (36.4 C)  TempSrc: Oral  Axillary Oral  SpO2:   99% 100%    Intake/Output Summary (Last 24 hours) at 08/04/2019 0810 Last data filed at 08/03/2019 1415 Gross per 24 hour  Intake --  Output 400 ml  Net -400 ml   There were no vitals filed for this visit.  Examination:  General exam: Disabled appearance, not in any kind of distress Respiratory system: Clear to auscultation. Respiratory effort normal. Cardiovascular system: S1 & S2 heard, irregularly irregular, no JVD Gastrointestinal system: Abdomen is nondistended, soft and nontender. Normal bowel sounds heard. Central nervous system: Alert but confused, able to move all extremities Extremities: No pedal edema, Skin: No rashes, lesions or ulcers Psychiatry: mood is okay.     Data Reviewed: I have personally reviewed following labs and imaging studies  CBC: Recent Labs  Lab 08/01/19 1037 08/01/19 1850 08/02/19 1128  WBC 10.2 8.2 5.4  NEUTROABS 8.9*  --  4.2  HGB 11.4* 11.0* 11.5*  HCT 37.0 35.7* 36.4  MCV 93.7 93.9 90.8  PLT 272 227 578   Basic Metabolic Panel: Recent Labs  Lab 08/01/19 1037 08/01/19 1850 08/02/19 1128  NA 143  --  141  K 3.4*  --  3.7  CL 107  --  109  CO2 26  --  25  GLUCOSE 138*  --  85  BUN 27*  --  17  CREATININE 0.83 0.81 0.73  CALCIUM 8.8*  --  8.4*   GFR: CrCl cannot be calculated (Unknown ideal weight.). Liver Function Tests: No results for input(s): AST, ALT, ALKPHOS, BILITOT, PROT, ALBUMIN in the last 168 hours. No results for input(s): LIPASE, AMYLASE in the last 168 hours. No results for input(s): AMMONIA in the last 168 hours. Coagulation Profile: No results for input(s): INR, PROTIME in the last 168 hours. Cardiac  Enzymes: No results for input(s): CKTOTAL, CKMB, CKMBINDEX, TROPONINI in the last 168 hours. BNP (last 3 results) No results for input(s): PROBNP in the last 8760 hours. HbA1C: No results for input(s): HGBA1C in the last 72 hours. CBG: Recent Labs  Lab 08/03/19 1226 08/03/19 1659 08/04/19 0014  GLUCAP 95 133* 118*   Lipid Profile: No results for input(s): CHOL, HDL, LDLCALC, TRIG, CHOLHDL, LDLDIRECT in the last 72 hours. Thyroid Function Tests: No results for input(s): TSH, T4TOTAL, FREET4, T3FREE, THYROIDAB in the last 72 hours. Anemia Panel: No results for input(s): VITAMINB12, FOLATE, FERRITIN, TIBC, IRON, RETICCTPCT in the last 72 hours. Sepsis Labs: No results for input(s): PROCALCITON, LATICACIDVEN in the last 168 hours.  Recent Results (from the past 240 hour(s))  Respiratory Panel by RT PCR (Flu A&B, Covid) - Nasopharyngeal Swab     Status: None   Collection Time: 08/01/19  4:34 PM   Specimen: Nasopharyngeal Swab  Result Value Ref Range Status   SARS Coronavirus 2 by RT PCR NEGATIVE NEGATIVE Final    Comment: (NOTE) SARS-CoV-2 target nucleic acids are NOT DETECTED. The SARS-CoV-2 RNA is generally detectable in upper respiratoy specimens during the acute phase of infection. The lowest concentration of SARS-CoV-2 viral copies this assay can detect is 131 copies/mL. A negative result does not preclude SARS-Cov-2 infection and should not be used as the sole basis for treatment or other patient management decisions. A negative result may occur with  improper specimen collection/handling, submission of specimen other than nasopharyngeal swab, presence of viral mutation(s) within the areas targeted by this assay, and inadequate number of viral copies (<131 copies/mL). A negative result must be combined with clinical observations, patient history, and epidemiological information. The expected result is Negative. Fact Sheet for Patients:   https://www.moore.com/ Fact Sheet for Healthcare Providers:  https://www.young.biz/ This test is not yet ap proved or cleared by the [place] FDA and  has been authorized for detection and/or diagnosis of SARS-CoV-2 by FDA under an Emergency Use Authorization (EUA). This EUA will remain  in effect (meaning this test can be used) for the duration of the COVID-19 declaration under Section 564(b)(1) of the Act, 21 U.S.C. section 360bbb-3(b)(1), unless the authorization is terminated or revoked sooner.    Influenza A by PCR NEGATIVE NEGATIVE Final   Influenza B by PCR NEGATIVE NEGATIVE Final    Comment: (NOTE) The Xpert Xpress SARS-CoV-2/FLU/RSV assay is intended as an aid in  the diagnosis of influenza from Nasopharyngeal swab specimens and  should not be used as a sole basis for treatment. Nasal washings and  aspirates are unacceptable for Xpert Xpress SARS-CoV-2/FLU/RSV  testing. Fact Sheet for Patients: https://www.moore.com/ Fact Sheet for Healthcare Providers: https://www.young.biz/ This test is not yet approved or cleared by the [place] FDA and  has been authorized for detection and/or diagnosis of SARS-CoV-2 by  FDA under an Emergency Use Authorization (EUA). This EUA will remain  in effect (meaning this test can be used) for the duration of the  Covid-19 declaration under Section 564(b)(1) of the Act, 21  U.S.C. section 360bbb-3(b)(1), unless the authorization is  terminated or  revoked. Performed at [facility], [address] [place], [place] [address]          Radiology Studies: MR MRA HEAD WO CONTRAST  Result Date: 08/02/2019 CLINICAL DATA:  85 year old female with right PCA infarct on brain MRI yesterday. EXAM: MRA HEAD WITHOUT CONTRAST TECHNIQUE: Angiographic images of the Circle of Willis were obtained using MRA technique without intravenous contrast.  COMPARISON:  Brain MRI 08/01/2019. FINDINGS: No intracranial mass effect. Ventricle size and configuration appears stable since yesterday. Antegrade flow in the posterior circulation with dominant appearing left vertebral artery. No distal vertebral stenosis and both PICA origins are patent. Patent basilar artery without stenosis. Patent SCA and PCA origins. Posterior communicating arteries are diminutive or absent. Normal right PCA origin, but there is moderate to severe irregularity and stenosis at the junction of the right P1 and P2 segments. There is preserved distal right PCA flow signal. Tortuous contralateral left P1 segment also with multifocal left P1 and P2 moderate to severe irregularity and stenosis (same image). Preserved distal left PCA branch flow also. Antegrade flow in both ICA siphons. Evidence of calcified siphon atherosclerosis, but only mild siphon stenosis most pronounced in the right anterior genu. Tortuous origin of the right ophthalmic artery incidentally noted. Patent carotid termini, MCA and ACA origins. Motion artifact degrades detail of the ACA branches which seem to remain patent. Bilateral MCA M1 segments and MCA bifurcations are patent, but motion degrades detail of the MCA branches. IMPRESSION: 1. Negative for large vessel occlusion in association with the Right PCA infarct, but positive for bilateral PCA moderate to severe irregularity (P1/P2 segment junction on the right). 2. Anterior circulation atherosclerosis with no significant stenosis through the proximal 2nd order branches, although other MCA and ACA branch detail is degraded by motion. Electronically Signed   By: Odessa FlemingH  Hall M.D.   On: 08/02/2019 11:50   ECHOCARDIOGRAM COMPLETE  Result Date: 08/02/2019   ECHOCARDIOGRAM REPORT   Patient Name:   Amy DingwallVIRGINIA T Randall Date of Exam: 08/02/2019 Medical Rec #:  [id number]         Height:       61.0 in Accession #:    [id number]        Weight:       100.0 lb Date of Birth:  1934/02/11          BSA:          1.41 m Patient Age:    85 years          BP:           156/94 mmHg Patient Gender: F                 HR:           81 bpm. Exam Location:  Inpatient Procedure: 2D Echo Indications:    Stroke 434.91/I163.9  History:        Patient has prior history of Echocardiogram examinations, most                 recent 01/02/2019. Arrythmias:Atrial Fibrillation; Risk                 Factors:Hypertension.  Sonographer:    Ross LudwigArthur Guy RDCS (AE) Referring Phys: [id number] [facility]  Sonographer Comments: Patient unresponsive when asked to sniff. IMPRESSIONS  1. Left ventricular ejection fraction, by visual estimation, is 60 to 65%. The left ventricle has normal function. There is mildly increased left ventricular hypertrophy.  2. The left ventricle  has no regional wall motion abnormalities.  3. Global right ventricle has normal systolic function.The right ventricular size is normal. No increase in right ventricular wall thickness.  4. Left atrial size was severely dilated.  5. Right atrial size was normal.  6. The mitral valve is normal in structure. Moderate mitral valve regurgitation. No evidence of mitral stenosis.  7. 2 separate mitral regurgitant jets.  8. The tricuspid valve is normal in structure.  9. The aortic valve is normal in structure. Aortic valve regurgitation is trivial. No evidence of aortic valve sclerosis or stenosis. 10. The pulmonic valve was normal in structure. Pulmonic valve regurgitation is trivial. 11. The inferior vena cava is normal in size with greater than 50% respiratory variability, suggesting right atrial pressure of 3 mmHg. 12. No intracardiac thrombi or masses were visualized. FINDINGS  Left Ventricle: Left ventricular ejection fraction, by visual estimation, is 60 to 65%. The left ventricle has normal function. The left ventricle has no regional wall motion abnormalities. There is mildly increased left ventricular hypertrophy. Normal left atrial pressure. Right Ventricle: The  right ventricular size is normal. No increase in right ventricular wall thickness. Global RV systolic function is has normal systolic function. Left Atrium: Left atrial size was severely dilated. Right Atrium: Right atrial size was normal in size Pericardium: There is no evidence of pericardial effusion. Mitral Valve: The mitral valve is normal in structure. There is mild thickening of the mitral valve leaflet(s). Moderate mitral valve regurgitation. No evidence of mitral valve stenosis by observation. 2 separate mitral regurgitant jets. Tricuspid Valve: The tricuspid valve is normal in structure. Tricuspid valve regurgitation is mild. Aortic Valve: The aortic valve is normal in structure. Aortic valve regurgitation is trivial. The aortic valve is structurally normal, with no evidence of sclerosis or stenosis. Aortic valve mean gradient measures 3.0 mmHg. Aortic valve peak gradient measures 5.2 mmHg. Aortic valve area, by VTI measures 1.51 cm. Pulmonic Valve: The pulmonic valve was normal in structure. Pulmonic valve regurgitation is trivial. Pulmonic regurgitation is trivial. Aorta: The aortic root, ascending aorta and aortic arch are all structurally normal, with no evidence of dilitation or obstruction. Venous: The inferior vena cava is normal in size with greater than 50% respiratory variability, suggesting right atrial pressure of 3 mmHg. IAS/Shunts: No atrial level shunt detected by color flow Doppler. There is no evidence of a patent foramen ovale. No ventricular septal defect is seen or detected. There is no evidence of an atrial septal defect. Additional Comments: No intracardiac thrombi or masses were visualized.  LEFT VENTRICLE PLAX 2D LVIDd:         4.10 cm LVIDs:         3.00 cm LV PW:         1.20 cm LV IVS:        1.20 cm LVOT diam:     1.70 cm LV SV:         39 ml LV SV Index:   28.15 LVOT Area:     2.27 cm  RIGHT VENTRICLE            IVC RV Basal diam:  2.50 cm    IVC diam: 2.00 cm RV S prime:      8.27 cm/s TAPSE (M-mode): 1.2 cm LEFT ATRIUM              Index       RIGHT ATRIUM           Index LA diam:  4.80 cm  3.41 cm/m  RA Area:     15.60 cm LA Vol (A2C):   106.0 ml 75.35 ml/m RA Volume:   32.60 ml  23.17 ml/m LA Vol (A4C):   79.5 ml  56.51 ml/m LA Biplane Vol: 96.9 ml  68.88 ml/m  AORTIC VALVE AV Area (Vmax):    1.59 cm AV Area (Vmean):   1.47 cm AV Area (VTI):     1.51 cm AV Vmax:           113.92 cm/s AV Vmean:          80.680 cm/s AV VTI:            0.220 m AV Peak Grad:      5.2 mmHg AV Mean Grad:      3.0 mmHg LVOT Vmax:         80.02 cm/s LVOT Vmean:        52.280 cm/s LVOT VTI:          0.147 m LVOT/AV VTI ratio: 0.67  AORTA Ao Root diam: 2.80 cm MR Peak grad:    158.3 mmHg  TRICUSPID VALVE MR Mean grad:    100.0 mmHg  TR Peak grad:   15.4 mmHg MR Vmax:         629.00 cm/s TR Vmax:        230.00 cm/s MR Vmean:        467.0 cm/s MR PISA:         1.57 cm    SHUNTS MR PISA Eff ROA: 8 mm       Systemic VTI:  0.15 m MR PISA Radius:  0.50 cm     Systemic Diam: 1.70 cm  Donato Schultz MD Electronically signed by Donato Schultz MD Signature Date/Time: 08/02/2019/2:37:12 PM    Final    VAS US CAROTID (at [facility] and [facility] only)  Result Date: 08/02/2019 Carotid Arterial Duplex Study Indications: CVA. Performing Technologist: Gertie Fey MHA, RDMS, RVT, RDCS  Examination Guidelines: A complete evaluation includes B-mode imaging, spectral Doppler, color Doppler, and power Doppler as needed of all accessible portions of each vessel. Bilateral testing is considered an integral part of a complete examination. Limited examinations for reoccurring indications may be performed as noted.  Right Carotid Findings: +----------+--------+--------+--------+--------------------------+--------+           PSV cm/sEDV cm/sStenosisPlaque Description        Comments +----------+--------+--------+--------+--------------------------+--------+ CCA Prox  40      7                                                   +----------+--------+--------+--------+--------------------------+--------+ CCA Distal31      8               focal and calcific                 +----------+--------+--------+--------+--------------------------+--------+ ICA Prox  27      8               heterogenous and irregular         +----------+--------+--------+--------+--------------------------+--------+ ICA Distal69      20                                                 +----------+--------+--------+--------+--------------------------+--------+  ECA       41      6                                                  +----------+--------+--------+--------+--------------------------+--------+ +----------+--------+-------+----------------+-------------------+           PSV cm/sEDV cmsDescribe        Arm Pressure (mmHG) +----------+--------+-------+----------------+-------------------+ [id number]             Multiphasic, WNL                    +----------+--------+-------+----------------+-------------------+ +---------+--------+--+--------+-+---------+ VertebralPSV cm/s24EDV cm/s8Antegrade +---------+--------+--+--------+-+---------+  Left Carotid Findings: +----------+--------+--------+--------+--------------------------+--------+           PSV cm/sEDV cm/sStenosisPlaque Description        Comments +----------+--------+--------+--------+--------------------------+--------+ CCA Prox  54      14              heterogenous and irregular         +----------+--------+--------+--------+--------------------------+--------+ CCA Distal48      14              heterogenous and irregular         +----------+--------+--------+--------+--------------------------+--------+ ICA Prox  36      11              heterogenous and irregular         +----------+--------+--------+--------+--------------------------+--------+ ICA Distal55      12                                                  +----------+--------+--------+--------+--------------------------+--------+ ECA       51      8               heterogenous and irregular         +----------+--------+--------+--------+--------------------------+--------+ +----------+--------+--------+--------------+-------------------+           PSV cm/sEDV cm/sDescribe      Arm Pressure (mmHG) +----------+--------+--------+--------------+-------------------+ Subclavian                Not identified                    +----------+--------+--------+--------------+-------------------+ +---------+--------+--+--------+-+---------+ VertebralPSV cm/s31EDV cm/s8Antegrade +---------+--------+--+--------+-+---------+  Summary: Right Carotid: Velocities in the right ICA are consistent with a 1-39% stenosis. Left Carotid: Velocities in the left ICA are consistent with a 1-39% stenosis. Vertebrals:  Bilateral vertebral arteries demonstrate antegrade flow. Subclavians: Left subclavian artery was not visualized. Normal flow hemodynamics              were seen in the right subclavian artery. *See table(s) above for measurements and observations.     Preliminary         Scheduled Meds: .  stroke: mapping our early stages of recovery book   Does not apply Once  . aspirin  300 mg Rectal Daily   Or  . aspirin  325 mg Oral Daily  . enoxaparin (LOVENOX) injection  40 mg Subcutaneous Q24H  . metoprolol tartrate  25 mg Oral BID  . rivastigmine  13.3 mg Transdermal Daily   Continuous Infusions: . 0.9 % NaCl with KCl 20 mEq / L 75 mL/hr at 08/03/19 0814     LOS: 3 days  Hosie Poisson, MD [facility]

## 2019-08-04 NOTE — Progress Notes (Signed)
STROKE TEAM PROGRESS NOTE   INTERVAL HISTORY Daughter at bedside.  Patient most time sleeping during the visit, however able to open eyes on voice.  Palliative care has been involved in talking with daughter, CODE STATUS DNR, family want to see patient new baseline before making decisions.  However, open to go back to memory unit after this admission and also open to anticoagulation during my conversation.  However, as per chart, daughter later discussed with Dr. Hilma Favors and decided no anticoagulation.  OBJECTIVE Vitals:   08/03/19 2243 08/04/19 0752 08/04/19 0901 08/04/19 1100  BP: (!) 131/100 (!) 181/127 (!) 176/115   Pulse: (!) 51 (!) 56 68   Resp: 18 16  15   Temp: 97.7 F (36.5 C) (!) 97.5 F (36.4 C)    TempSrc: Axillary Oral    SpO2: 99% 100%      CBC:  Recent Labs  Lab 08/02/19 1128 08/04/19 0943  WBC 5.4 8.4  NEUTROABS 4.2 6.4  HGB 11.5* 12.4  HCT 36.4 38.5  MCV 90.8 89.3  PLT 218 264    Basic Metabolic Panel:  Recent Labs  Lab 08/02/19 1128 08/04/19 0943  NA 141 139  K 3.7 3.6  CL 109 106  CO2 25 24  GLUCOSE 85 109*  BUN 17 11  CREATININE 0.73 0.83  CALCIUM 8.4* 8.5*  MG  --  1.9  PHOS  --  3.1    Lipid Panel:     Component Value Date/Time   CHOL 196 08/04/2019 0943   TRIG 115 08/04/2019 0943   HDL 34 (L) 08/04/2019 0943   CHOLHDL 5.8 08/04/2019 0943   VLDL 23 08/04/2019 0943   LDLCALC 139 (H) 08/04/2019 0943   HgbA1c:  Lab Results  Component Value Date   HGBA1C 4.9 08/04/2019   IMAGING  CT Head Wo Contrast 08/01/2019 IMPRESSION:  1. Lucency with loss of gray-white differentiation is identified in the right occipital lobe suspicious for acute infarct.  2. No focal acute hemorrhage.  3. No acute fracture or dislocation of cervical spine.  4. Degenerative joint changes of cervical spine.  5. Multiple nodules identified in bilateral thyroid gland. This may be due to multinodular goiter.   CT Cervical Spine Wo  Contrast 08/01/2019 IMPRESSION:  No acute fracture or dislocation of cervical spine.   MR MRA HEAD WO CONTRAST 08/02/2019 IMPRESSION:  1. Negative for large vessel occlusion in association with the Right PCA infarct, but positive for bilateral PCA moderate to severe irregularity (P1/P2 segment junction on the right).  2. Anterior circulation atherosclerosis with no significant stenosis through the proximal 2nd order branches, although other MCA and ACA branch detail is degraded by motion.   MR BRAIN WO CONTRAST 08/01/2019 IMPRESSION:  1. Acute/subacute nonhemorrhagic right PCA territory infarct involving the right occipital lobe, splenium of the corpus callosum, and posterior right hippocampus.  2. Advanced atrophy and diffuse white matter disease likely reflects the sequela of chronic microvascular ischemia.  3. Remote hemorrhagic lacunar infarcts of the left basal ganglia and corona radiata.  4. Remote nonhemorrhagic lacunar infarcts of the cerebellum bilaterally.   DG CHEST PORT 1 VIEW 08/01/2019 IMPRESSION:  1. No acute cardiopulmonary process.  2. Cardiomegaly. No evidence of congestive heart failure.  3. Right hemidiaphragm eventration.   ECHOCARDIOGRAM COMPLETE 08/02/2019 IMPRESSIONS   1. Left ventricular ejection fraction, by visual estimation, is 60 to 65%. The left ventricle has normal function. There is mildly increased left ventricular hypertrophy.   2. The left ventricle has no regional  wall motion abnormalities.   3. Global right ventricle has normal systolic function.The right ventricular size is normal. No increase in right ventricular wall thickness.   4. Left atrial size was severely dilated.   5. Right atrial size was normal.   6. The mitral valve is normal in structure. Moderate mitral valve regurgitation. No evidence of mitral stenosis.   7. 2 separate mitral regurgitant jets.   8. The tricuspid valve is normal in structure.   9. The aortic valve is normal in  structure. Aortic valve regurgitation is trivial. No evidence of aortic valve sclerosis or stenosis.  10. The pulmonic valve was normal in structure. Pulmonic valve regurgitation is trivial.  11. The inferior vena cava is normal in size with greater than 50% respiratory variability, suggesting right atrial pressure of 3 mmHg.  12. No intracardiac thrombi or masses were visualized.   VAS US [facility]ID (at [facility] and [facility] only) 08/02/2019 Summary:  Right Carotid: Velocities in the right ICA are consistent with a 1-39% stenosis.  Left Carotid: Velocities in the left ICA are consistent with a 1-39% stenosis.  Vertebrals:  Bilateral vertebral arteries demonstrate antegrade flow.  Subclavians: Left subclavian artery was not visualized. Normal flow hemodynamics were seen in the right subclavian artery.  Preliminary    ECG - atrial fibrillation - ventricular response 71 BPM (See cardiology reading for complete details)   PHYSICAL EXAM  Temp:  [97.5 F (36.4 C)-98.2 F (36.8 C)] 97.5 F (36.4 C) (12/28 0752) Pulse Rate:  [51-101] 68 (12/28 0901) Resp:  [15-18] 15 (12/28 1100) BP: (131-181)/(99-127) 176/115 (12/28 0901) SpO2:  [99 %-100 %] 100 % (12/28 0752)  General - Well nourished, well developed, in no apparent distress.  Ophthalmologic - fundi not visualized due to noncooperation.  Cardiovascular - irregularly irregular heart rate and rhythm  Neuro - awake alert, lying in bed, in fetal position, eyes open, answered "I do not know" for all orientation questions.  Paucity of speech, not able to name or repeat.  Mild dysarthria.  Inconsistently blinking to visual threat bilaterally.  Tracking on both sides, no obvious hemianopia on exam.  PERRL, EOMI.  Facial symmetrical.  Moving bilateral upper symmetrically, 3/5.  Bilateral lower extremity withdraw to pain, 2/5. Sensation, coordination and gait not tested.   ASSESSMENT/PLAN Amy Randall is a 85 y.o. female with history of frequent  falls, dementia, hypertension, and atrial fibrillation (not on anticoagulation) presenting after a fall at her nursing facility.   Stroke: Acute/subacute nonhemorrhagic right PCA territory infarct - embolic pattern, likely due to A. fib not on AC  Code Stroke CT Head - negative  CT head - Lucency with loss of gray-white differentiation is identified in the right occipital lobe suspicious for acute infarct.   MRI head - Acute/subacute nonhemorrhagic right PCA territory infarct involving the right occipital lobe, splenium of the corpus callosum, and posterior right hippocampus.  Bilateral cerebellum old infarcts.  MRA head -positive for bilateral PCA moderate to severe irregularity, including P1/P2 segment junction on the right  Carotid Doppler - unremarkable  2D Echo - EF 60 - 65%. No cardiac source of emboli identified.  Sars Corona Virus 2 - negative  LDL - 139  HgbA1c - 4.9  VTE prophylaxis - Lovenox  No antithrombotic prior to admission, now on aspirin 325 mg daily.  [facility] due to frequent falls, no AC after palliative care GOC discussion   Therapy recommendations:  SNF  Disposition:  Pending  Code  Status: DNR, supportive care, NOT comfort care  A. fib, chronic  Chronic A. fib not on AC due to frequent falls  Given current stroke, patient should be put on [facility] for stroke prevention  However, frequent falls are concerning   no AC after palliative care GOC discussion   Continue aspirin  Resume beta blocker  Dementia  Baseline not orientated, advanced dementia  Has been diagnosed with Alzheimer's disease in the past  On [employer] patch  Palliative care GOC discussion -> DNR, supportive care, no AC (NOT comfort care)  Hypertension  Home BP meds: Diltiazem ; metoprolol  BP Stable - resumed metoprolol . Gradually normalize in 3-5 days  . Long-term BP goal normotensive  Dysphagia   Due to stroke and dementia  On dys 2 and thin liquid  now  Speech on board  Encourage po intake  Other Stroke Risk Factors  Advanced age  Family hx stroke (father)  Previous strokes by imaging  Other Active Problems  Frequent falls  COVID infection 2-3 months ago as per RN - this admission COVID negative  Hospital day # 3  Neurology will sign off. Please call with questions. Pt will follow up with stroke clinic NP at [facility] in about 4 weeks. Thanks for the consult.   Marvel Plan, MD PhD Stroke Neurology 08/04/2019 2:18 PM   To contact Stroke Continuity provider, please refer to [email or web address]. After hours, contact General Neurology

## 2019-08-05 LAB — RENAL FUNCTION PANEL
Albumin: 2.6 g/dL — ABNORMAL LOW (ref 3.5–5.0)
Anion gap: 7 (ref 5–15)
BUN: 15 mg/dL (ref 8–23)
CO2: 23 mmol/L (ref 22–32)
Calcium: 8.6 mg/dL — ABNORMAL LOW (ref 8.9–10.3)
Chloride: 107 mmol/L (ref 98–111)
Creatinine, Ser: 0.8 mg/dL (ref 0.44–1.00)
GFR calc Af Amer: >60 mL/min (ref >60)
GFR calc non Af Amer: >60 mL/min (ref >60)
Glucose, Bld: 114 mg/dL — ABNORMAL HIGH (ref 70–99)
Phosphorus: 3.3 mg/dL (ref 2.5–4.6)
Potassium: 4.1 mmol/L (ref 3.5–5.1)
Sodium: 137 mmol/L (ref 135–145)

## 2019-08-05 LAB — GLUCOSE, CAPILLARY
Glucose-Capillary: 90 mg/dL (ref 70–99)
Glucose-Capillary: 81 mg/dL (ref 70–99)
Glucose-Capillary: 104 mg/dL — ABNORMAL HIGH (ref 70–99)
Glucose-Capillary: 95 mg/dL (ref 70–99)

## 2019-08-05 MED ORDER — ACETAMINOPHEN 650 MG RE SUPP: 650.0000 mg | Freq: Four times a day (QID) | RECTAL | Status: DC

## 2019-08-05 MED ORDER — ACETAMINOPHEN 325 MG PO TABS
650.0000 mg | ORAL_TABLET | Freq: Four times a day (QID) | ORAL | Status: AC
  Administered 2019-08-05 – 2019-08-06 (×3): 650 mg via ORAL
  Filled 2019-08-05 (×3): qty 2

## 2019-08-05 NOTE — Progress Notes (Signed)
[facility] Progress Note  Patient: Amy DingwallVirginia T Randall BJY:[id number]   PCP: Kari BaarsHawkins, Edward, MD DOB: 09/10/1933   DOA: 08/01/2019   DOS: 08/05/2019   Date of Service: the patient was seen and examined on 08/05/2019  Chief complaint Unwitnessed fall  Brief hospital course: [place] is a 85 y.o. female with medical history significant for dementia, atrial fibrillation, hypertension, hyperthyroidism sent from nursing home due to fall.Patient is unable to get history obtained from the ER chart ED physician and from patient's daughter over the phone.Patient was found found on the floor by the staff with right forehead laceration and some bleeding.At baseline she is alert to person only,and has been having acutely progressive mental decline for the past 2 years and has had frequent falls at the facility.No report of fever,respiratory distress or vomiting or diarrhea.   In ED: Blood pressure stable, heart rate In A fib rate controlled , saturating 99% on room air, low temperature 97.4, lab work with potassium 3.4, BUN 27, troponin VI, WBC stable,rapid Covid antigen negative but Covid PCR pending.  Initially had CT head and CT C-spine subsequently MRI that showed acute/subacute nonhemorrhagic right PCA territory infarct involving the right occipital lobe, splenium of the corpus callosum, and posterior right hippocampus and advanced atrophy and diffuse white matter disease,Remote hemorrhagic lacunar infarcts of the left basal ganglia and corona radiata Remote nonhemorrhagic lacunar infarcts of the cerebellum bilaterally."  Patient received Versed prior to MRI, on my evaluation patient is lethargic able to withdraw right leg to pain, on room air saturating well.  Dr. Amada JupiterKirkpatrick from neurology was consulted [facility] stroke work-up  08/03/2019: Patient seen alongside a physical therapist.  Patient seems not eager to speak to us today.  Patient also a bit sleepy.   Apparently, patient just tried some physical therapy with associated significant tachycardia/A. fib RVR.  Rate went up to the 130s.  Systolic blood pressures 156 mmHg.  Otherwise, no new changes.  08/05/2019: Patient seen alongside patient's daughter.  Updated patient's daughter.  Also outside patient's daughter's questions.  Patient's daughter wants patient to be on Tylenol around-the-clock for a couple of days.  Patient is daughter has opted to discuss anticoagulation further with the neurologist.  Apparently, patient's daughter tells me that she and her brother will prefer patient to remain on anticoagulation despite known risk of anticoagulation.  As documented above, patient's daughter will discuss further with neurology team.  Subjective: No history from patient.  Assessment and Plan: Scheduled Meds: .  stroke: mapping our early stages of recovery book   Does not apply Once  . aspirin  300 mg Rectal Daily   Or  . aspirin  325 mg Oral Daily  . enoxaparin (LOVENOX) injection  40 mg Subcutaneous Q24H  . metoprolol tartrate  25 mg Oral BID  . rivastigmine  13.3 mg Transdermal Daily   Continuous Infusions: . 0.9 % NaCl with KCl 20 mEq / L 75 mL/hr at 08/05/19 0943   PRN Meds: acetaminophen **OR** acetaminophen (TYLENOL) oral liquid 160 mg/5 mL **OR** acetaminophen  Acute/subacute nonhemorrhagic right PCA territory infarct involving the right occipital lobe, splenium of the corpus callosum, and posterior right hippocampus :  -Neurology is directing care.   -Currently on aspirin suppository. -PT OT input is appreciated.   -Patient has advanced dementia, and was seen in a memory care unit.  -Speech therapy input is appreciated, dysphagia 2 diet recommended. -We will consult the palliative care team.  There  is need to discuss goal of care and CODE STATUS.   -Continue to monitor heart rate and blood pressure.   -Continue permissive hypertension.   -Further management will depend on  hospital course.  08/05/2019: Patient is more awake and alert today.  Patient was all extremities.  Patient remains significantly weak.  Continue PT OT for now.  Hypokalemia:  -Monitor. -Patient is currently on normal saline with KCl. 08/05/2019: BMP done on 08/04/2019 revealed potassium of 3.6.  Will repeat renal panel in the morning.  Concern for Covid infection. Daughter is concerned that patient may have Covid infection in November. ED as well as nursing home is concerned that the patient may have a Covid infection as well. Patient is from [facility] memory care unit which actually has Covid positive patients. Patient's Covid test is negative as well as Covid antibody test is negative and does not have any respiratory illness on examination nor has any evidence of infiltrates on the chest x-ray therefore I will not give isolating the patient.  08/03/2019: Covid tests have come back negative.   Fall at nursing home with laceration, Unwitnessed. Apparently patient does have multiple falls at home. Because of that reason patient is not a candidate for anticoagulation for A. fib. CT head and MRI noted as above.  Advanced dementia: Continue supportive care fall precaution.  Palliative care consult 08/05/2019: Possible discharge with hospice follow-up.  Atrial fibrillation likely permanent,known history, noted in  EKG, rate is stable at times slow ventricuar rate. Not on anticoagulation,at nursing home,due to her frequent falls. 08/05/2019: Heart rate is controlled.  Patient's daughter will discuss anticoagulation further with neurology.  Hypertension:Blood Pressure well controlled hold Cardizem allow permissive hypertension.  Hyperthyroidism on methimazole -resume once able to take p.o.  Goals of care:  Palliative care team input highly appreciated.      Diet: Dysphagia type 2  DVT Prophylaxis: Subcutaneous Lovenox   Advance goals of care discussion: Full code  Family  Communication:   Disposition:  Discharge to be determined.  Consultants: Palliative care, neurology Procedures: Echocardiogram   Antibiotics: Anti-infectives (From admission, onward)   None       Objective: Physical Exam: Vitals:   08/04/19 1610 08/04/19 2140 08/04/19 2334 08/05/19 0841  BP: (!) 155/109 (!) 130/97 (!) 151/91 (!) 166/95  Pulse: 74 60 82 77  Resp: 16  16 (!) 24  Temp: 97.7 F (36.5 C)  (!) 97.5 F (36.4 C) (!) 97.3 F (36.3 C)  TempSrc: Oral  Oral Oral  SpO2: 98%  99% 99%   No intake or output data in the 24 hours ending 08/05/19 1124 There were no vitals filed for this visit. General: Awake and alert today.  Not in any distress.   Eyes: No jaundice.  Patient is pale.   Cardiovascular: S1 and S2   Respiratory: Clear to auscultation  Abdomen: Bowel Sound present, Soft and no tenderness, no hernia Extremities: no Pedal edema Neurologic: Sleepy.  Patient will not follow complete neuro exam.  CBC: Recent Labs  Lab 08/01/19 1037 08/01/19 1850 08/02/19 1128 08/04/19 0943  WBC 10.2 8.2 5.4 8.4  NEUTROABS 8.9*  --  4.2 6.4  HGB 11.4* 11.0* 11.5* 12.4  HCT 37.0 35.7* 36.4 38.5  MCV 93.7 93.9 90.8 89.3  PLT 272 227 218 622   Basic Metabolic Panel: Recent Labs  Lab 08/01/19 1037 08/01/19 1850 08/02/19 1128 08/04/19 0943  NA 143  --  141 139  K 3.4*  --  3.7 3.6  CL 107  --  109 106  CO2 26  --  25 24  GLUCOSE 138*  --  85 109*  BUN 27*  --  17 11  CREATININE 0.83 0.81 0.73 0.83  CALCIUM 8.8*  --  8.4* 8.5*  MG  --   --   --  1.9  PHOS  --   --   --  3.1    Liver Function Tests: Recent Labs  Lab 08/04/19 0943  ALBUMIN 2.5*   No results for input(s): LIPASE, AMYLASE in the last 168 hours. No results for input(s): AMMONIA in the last 168 hours. Coagulation Profile: No results for input(s): INR, PROTIME in the last 168 hours. Cardiac Enzymes: No results for input(s): CKTOTAL, CKMB, CKMBINDEX, TROPONINI in the last 168 hours. BNP (last  3 results) No results for input(s): PROBNP in the last 8760 hours. CBG: Recent Labs  Lab 08/04/19 0014 08/04/19 1134 08/04/19 1734 08/05/19 0102 08/05/19 0644  GLUCAP 118* 104* 96 90 81   Studies: No results found.   Time spent: 25 minutes  Author: Berton Mount, MD [facility] 08/05/2019 11:24 AM  To reach On-call, see care teams to locate the attending and reach out to them via [email or web address]. If 7PM-7AM, please contact night-coverage If you still have difficulty reaching the attending provider, please page the [facility] (Director on Call) for [facility] on amion for assistance.

## 2019-08-05 NOTE — Progress Notes (Signed)
  Speech Language Pathology Treatment: Dysphagia  Patient Details Name: Amy Randall MRN: [id number] DOB: 08-31-1933 Today's Date: 08/05/2019 Time: 5188-4166 SLP Time Calculation (min) (ACUTE ONLY): 10 min  Assessment / Plan / Recommendation Clinical Impression  Pt was seen for skilled ST targeting dysphagia goals.  Pt was awake in bed and agreeable to consuming presentations of her currently prescribed diet despite having recently eaten ~50% of her breakfast tray.  Pt demonstrated no overt s/s of aspiration with dys 2 solids or thin liquids via straw although trials were limited.  Oral phase and swallow response appeared timely.  Spoke with nursing who reports that pt exhibits a preference for pureed textures and has a poor appetite but is otherwise tolerating her current diet without overt s/s of aspiration.  Pt was left in bed with bed alarm set and call bell within reach.    HPI HPI: Patient is an 85 y.o. female with advanced dementia who has been living at [facility] since July of 2020. PMH: atrial fibrillation, dementia, hyperthyroidism, HTN who was found on floor of ALF with right forehead laceration and bleeding. At baseline, she has had frequent falls at the facility and mental decline over the past two years. MRI revealed acute/subacute nonhemorragic right PCA territory infarct.      SLP Plan  Continue with current plan of care       Recommendations  Diet recommendations: Dysphagia 2 (fine chop);Thin liquid Liquids provided via: Cup;Straw Medication Administration: Whole meds with puree Supervision: Patient able to self feed;Full supervision/cueing for compensatory strategies Compensations: Minimize environmental distractions;Slow rate;Small sips/bites Postural Changes and/or Swallow Maneuvers: Seated upright 90 degrees                Oral Care Recommendations: Oral care BID Follow up Recommendations: Skilled Nursing facility SLP Visit Diagnosis: Dysphagia,  unspecified (R13.10) Plan: Continue with current plan of care       GO                Ramzi Brathwaite, Selinda Orion 08/05/2019, 10:06 AM

## 2019-08-05 NOTE — Progress Notes (Signed)
Physical Therapy Treatment Patient Details Name: Amy Randall MRN: [id number] DOB: [birth year] Today's Date: 08/05/2019    History of Present Illness 85 y.o. female admitted on 08/01/19 forfall with R head laceration and bruising.  COVID testing negative.  MRI showed acute/subacute non hemorrhagic R PCA territory infarct involving R occipital lobe, splenium of the corpus callosum and posterior R hippocampus.  Pt with significant PMH of A-fib, dementia, HTN, R femur IM nail (12/2018).      PT Comments    Pt more alert today however continues to require max/near total assist for all mobility and OOB transfer to the chair. Pt with strong retropulsion/posterior lean in sitting and standing. Spoke with daughter regarding PLOF stating she could walk with a Desena and help with ADLs but she wouldn't follow commands. At this time pt has regressing in functional mobility compared to PLOF and requires higher level of assist/care. Amy Randall, daughter, reports she has seen that level of care provided at [facility] but she will verify. Acute PT to cont to follow and progress mobility as able.    Follow Up Recommendations  SNF(dtr reports [facility] can provide total assist if needed)     Equipment Recommendations  Hospital bed;Wheelchair (measurements PT);Wheelchair cushion (measurements PT)    Recommendations for Other Services       Precautions / Restrictions Precautions Precautions: Fall Precaution Comments: advance dementia, has had several falls Restrictions Weight Bearing Restrictions: No    Mobility  Bed Mobility Overal bed mobility: Needs Assistance Bed Mobility: Supine to Sit     Supine to sit: Max assist     General bed mobility comments: pt with no active participation, maxA for LE management, maxA for trunk elevation, pt with strong posterior lean requiring maxA to maintain EOB balance, pt with no self correction and reactive response to falling backwards  Transfers Overall  transfer level: Needs assistance Equipment used: 2 person hand held assist(2 person lift with gait belt) Transfers: Sit to/from Stand;Stand Pivot Transfers Sit to Stand: Max assist;Total assist;+2 physical assistance Stand pivot transfers: Max assist       General transfer comment: max directional verbal cues, maxAx2 to power up, weight-shift, and advance LEs to pvt to chair, pt unable to achieve full upright standing, bilat knee bent and pt con't with posterior lean  Ambulation/Gait             General Gait Details: unable   Stairs             Wheelchair Mobility    Modified Rankin (Stroke Patients Only) Modified Rankin (Stroke Patients Only) Pre-Morbid Rankin Score: Moderate disability Modified Rankin: Severe disability     Balance Overall balance assessment: Needs assistance Sitting-balance support: Feet supported;No upper extremity supported Sitting balance-Leahy Scale: Poor Sitting balance - Comments: pt with posterior lean, maxA to maintain midline and upright posture Postural control: Posterior lean                                  Cognition Arousal/Alertness: Awake/alert Behavior During Therapy: Flat affect Overall Cognitive Status: History of cognitive impairments - at baseline Area of Impairment: Orientation;Attention;Memory;Following commands;Safety/judgement;Awareness;Problem solving                 Orientation Level: Disoriented to;Place;Time;Situation(pt doesn't recognize daughter) Current Attention Level: Focused Memory: Decreased short-term memory Following Commands: Follows one step commands inconsistently(pt with no command follow) Safety/Judgement: Decreased awareness of deficits;Decreased awareness of safety Awareness: Intellectual  Problem Solving: Slow processing;Decreased initiation;Difficulty sequencing;Requires verbal cues;Requires tactile cues General Comments: pt with advance dementia, pt did open eyes to name  and maintain eyes open during session until in chair, pt did respond, "oh alright," "okay", "no not to hungry" to PTs questions and comments. pt with no active command follow, dtr reports this to be her baseline      Exercises      General Comments General comments (skin integrity, edema, etc.): pt with R bump/bruising/cut on forehead from fall      Pertinent Vitals/Pain Pain Assessment: No/denies pain    Home Living                      Prior Function            PT Goals (current goals can now be found in the care plan section) Acute Rehab PT Goals Patient Stated Goal: dtr wants he back to walking like she was Progress towards PT goals: Progressing toward goals    Frequency    Min 3X/week      PT Plan Current plan remains appropriate    Co-evaluation              AM-PAC PT "6 Clicks" Mobility   Outcome Measure  Help needed turning from your back to your side while in a flat bed without using bedrails?: Total Help needed moving from lying on your back to sitting on the side of a flat bed without using bedrails?: Total Help needed moving to and from a bed to a chair (including a wheelchair)?: Total Help needed standing up from a chair using your arms (e.g., wheelchair or bedside chair)?: Total Help needed to walk in hospital room?: Total Help needed climbing 3-5 steps with a railing? : Total 6 Click Score: 6    End of Session Equipment Utilized During Treatment: Gait belt Activity Tolerance: Patient limited by fatigue;Patient limited by lethargy Patient left: in chair;with call bell/phone within reach;with nursing/sitter in room;with family/visitor present Nurse Communication: Mobility status PT Visit Diagnosis: Muscle weakness (generalized) (M62.81);Difficulty in walking, not elsewhere classified (R26.2);Other symptoms and signs involving the nervous system (R29.898)     Time: 1194-1740 PT Time Calculation (min) (ACUTE ONLY): 24 min  Charges:   $Therapeutic Activity: 23-37 mins                     Kittie Plater, PT, DPT Acute Rehabilitation Services Pager #: [phone] Office #: [phone]    Berline Lopes 08/05/2019, 1:11 PM

## 2019-08-06 LAB — SARS CORONAVIRUS 2 (TAT 6-24 HRS): SARS Coronavirus 2: NEGATIVE

## 2019-08-06 LAB — GLUCOSE, CAPILLARY
Glucose-Capillary: 116 mg/dL — ABNORMAL HIGH (ref 70–99)
Glucose-Capillary: 73 mg/dL (ref 70–99)
Glucose-Capillary: 92 mg/dL (ref 70–99)

## 2019-08-06 MED ORDER — METOPROLOL TARTRATE 50 MG PO TABS
50.0000 mg | ORAL_TABLET | Freq: Two times a day (BID) | ORAL | Status: DC
  Administered 2019-08-06 – 2019-08-08 (×5): 50 mg via ORAL
  Filled 2019-08-06 (×5): qty 1

## 2019-08-06 NOTE — NC FL2 (Addendum)
  [place] LEVEL OF CARE SCREENING TOOL     IDENTIFICATION  Patient Name: Amy Randall Birthdate: 03-02-1934 Sex: female Admission Date (Current Location): 08/01/2019  [facility] and [place] Number:  Herbalist and Address:  [facility], [place] [address] [place], [place] [address]      Provider Number: [id number]  Attending Physician Name and Address:  Bonnell Public, MD  Relative Name and Phone Number:  Arcelia Jew (Daughter)[phone]    Current Level of Care: Hospital Recommended Level of Care: Memory Care Prior Approval Number:    Date Approved/Denied:   PASRR Number:    Discharge Plan: Other (Comment)(Memory care unit)    Current Diagnoses: Patient Active Problem List   Diagnosis Date Noted  . Stroke ([place]) 08/01/2019  . Tachycardia 01/02/2019  . Acute CHF (congestive heart failure) ([place]) 01/02/2019  . Hip fracture ([place]) 12/14/2018  . Essential hypertension 12/14/2018  . Dementia without behavioral disturbance ([place]) 12/14/2018  . AF (paroxysmal atrial fibrillation) ([place]) 12/14/2018  . Hyperglycemia 12/14/2018    Orientation RESPIRATION BLADDER Height & Weight     (pt is alert)  Normal Incontinent Weight:   Height:     BEHAVIORAL SYMPTOMS/MOOD NEUROLOGICAL BOWEL NUTRITION STATUS      Incontinent Diet(Dysphagia 2, thin liquids)  AMBULATORY STATUS COMMUNICATION OF NEEDS Skin   Total Care Verbally Normal                       Personal Care Assistance Level of Assistance  Bathing, Feeding, Dressing Bathing Assistance: Maximum assistance Feeding assistance: Maximum assistance Dressing Assistance: Maximum assistance     Functional Limitations Info  Sight, Hearing, Speech Sight Info: Adequate Hearing Info: Adequate Speech Info: Adequate    SPECIAL CARE FACTORS FREQUENCY                       Contractures Contractures Info: Not present    Additional Factors Info  Code Status,  Allergies Code Status Info: DNR Allergies Info: Bactrim Sulfamethoxazole-trimethoprim, Ibuprofen, Orange Fruit Citrus, Penicillins           Current Medications (08/06/2019):   Discharge Medications: STOP taking these medications   furosemide 20 MG tablet Commonly known as: LASIX   LORazepam 0.5 MG tablet Commonly known as: ATIVAN   potassium chloride 10 MEQ tablet Commonly known as: KLOR-CON   sertraline 50 MG tablet Commonly known as: ZOLOFT     TAKE these medications   aspirin 325 MG tablet Take 1 tablet (325 mg total) by mouth daily. Start taking on: August 08, 2019   diltiazem 360 MG 24 hr capsule Commonly known as: CARDIZEM CD Take 1 capsule (360 mg total) by mouth daily.   Ensure Take 237 mLs by mouth 3 (three) times daily between meals.   methimazole 5 MG tablet Commonly known as: TAPAZOLE Take 5 mg by mouth daily.   metoprolol tartrate 25 MG tablet Commonly known as: LOPRESSOR Take 1 tablet (25 mg total) by mouth 2 (two) times daily.   rivastigmine 13.3 MG/24HR Commonly known as: EXELON Place 13.3 mg onto the skin daily.     Relevant Imaging Results:  Relevant Lab Results:   Additional Information SS#[id number], hospice to follow at facility  [facility], Daivd Council, RN

## 2019-08-06 NOTE — Progress Notes (Signed)
[facility] Progress Note  Patient: Amy Randall AQT:[id number]   PCP: [name], MD DOB: 1934/03/31   DOA: 08/01/2019   DOS: 08/06/2019   Date of Service: the patient was seen and examined on 08/06/2019  Chief complaint Unwitnessed fall  Brief hospital course: Amy Randall is a 85 y.o. female with medical history significant for dementia, atrial fibrillation, hypertension, hyperthyroidism sent from nursing home due to fall.Patient is unable to get history obtained from the ER chart ED physician and from patient's daughter over the phone.Patient was found found on the floor by the staff with right forehead laceration and some bleeding.At baseline she is alert to person only,and has been having acutely progressive mental decline for the past 2 years and has had frequent falls at the facility.No report of fever,respiratory distress or vomiting or diarrhea.   In ED: Blood pressure stable, heart rate In A fib rate controlled , saturating 99% on room air, low temperature 97.4, lab work with potassium 3.4, BUN 27, troponin VI, WBC stable,rapid Covid antigen negative but Covid PCR pending.  Initially had CT head and CT C-spine subsequently MRI that showed acute/subacute nonhemorrhagic right PCA territory infarct involving the right occipital lobe, splenium of the corpus callosum, and posterior right hippocampus and advanced atrophy and diffuse white matter disease,Remote hemorrhagic lacunar infarcts of the left basal ganglia and corona radiata Remote nonhemorrhagic lacunar infarcts of the cerebellum bilaterally."  Patient received Versed prior to MRI, on my evaluation patient is lethargic able to withdraw right leg to pain, on room air saturating well.  Dr. Amada Jupiter from neurology was consulted advised admission to [facility] for further stroke work-up  08/03/2019: Patient seen alongside a physical therapist.  Patient seems not eager to speak to Amy Randall today.  Patient also a bit sleepy.   Apparently, patient just tried some physical therapy with associated significant tachycardia/A. fib RVR.  Rate went up to the 130s.  Systolic blood pressures 156 mmHg.  Otherwise, no new changes.  08/05/2019: Patient seen alongside patient's daughter.  Updated patient's daughter.  Also outside patient's daughter's questions.  Patient's daughter wants patient to be on Tylenol around-the-clock for a couple of days.  Patient is daughter has opted to discuss anticoagulation further with the neurologist.  Apparently, patient's daughter tells me that she and her brother will prefer patient to remain on anticoagulation despite known risk of anticoagulation.  As documented above, patient's daughter will discuss further with neurology team.  08/06/2019: Patient seen.  Patient is resting quietly.  Heart rate is minimally elevated at 107 bpm.  Elevated blood pressure, and the last documented blood pressure was 178/125 mmHg.  Will increase Lopressor to 50 Mg p.o. twice daily.  Will start optimize blood pressure cautiously.  Likely discharge in the next 1 to 2 days.  Subjective: No history from patient.  Assessment and Plan: Scheduled Meds: .  stroke: mapping our early stages of recovery book   Does not apply Once  . aspirin  325 mg Oral Daily  . enoxaparin (LOVENOX) injection  40 mg Subcutaneous Q24H  . metoprolol tartrate  50 mg Oral BID  . rivastigmine  13.3 mg Transdermal Daily   Continuous Infusions: . 0.9 % NaCl with KCl 20 mEq / L 75 mL/hr at 08/06/19 1436   PRN Meds:   Acute/subacute nonhemorrhagic right PCA territory infarct involving the right occipital lobe, splenium of the corpus callosum, and posterior right hippocampus :  -Neurology is directing care.   -Currently on aspirin suppository. -PT  OT input is appreciated.   -Patient has advanced dementia, and was seen in a memory care unit.  -Speech therapy input is appreciated, dysphagia 2 diet recommended. -We will consult the palliative  care team.  There is need to discuss goal of care and CODE STATUS.   -Continue to monitor heart rate and blood pressure.   -Continue permissive hypertension.   -Further management will depend on hospital course.  08/05/2019: Patient is more awake and alert today.  Patient was all extremities.  Patient remains significantly weak.  Continue PT OT for now. 08/06/2019: Patient continues to improve.  Likely discharge in the next 1 to 2 days.  Cautiously optimize blood pressure  Hypokalemia:  -Monitor. -Patient is currently on normal saline with KCl. 08/05/2019: BMP done on 08/04/2019 revealed potassium of 3.6.  Will repeat renal panel in the morning. 08/06/2019: This is resolved.  Concern for Covid infection. Daughter is concerned that patient may have Covid infection in November. ED as well as nursing home is concerned that the patient may have a Covid infection as well. Patient is from [facility] memory care unit which actually has Covid positive patients. Patient's Covid test is negative as well as Covid antibody test is negative and does not have any respiratory illness on examination nor has any evidence of infiltrates on the chest x-ray therefore I will not give isolating the patient.  08/03/2019: Covid tests have come back negative.   Fall at nursing home with laceration, Unwitnessed. Apparently patient does have multiple falls at home. Because of that reason patient is not a candidate for anticoagulation for A. fib. CT head and MRI noted as above.  Advanced dementia: Continue supportive care fall precaution.  Palliative care consult 08/05/2019: Possible discharge with hospice follow-up.  Atrial fibrillation likely permanent,known history, noted in  EKG, rate is stable at times slow ventricuar rate. Not on anticoagulation,at nursing home,due to her frequent falls. 08/05/2019: Heart rate is controlled.  Patient's daughter will discuss anticoagulation further with  neurology. 08/06/2019: Heart rate is 107 bpm.  We will increase the dose of Lopressor.  Monitor heart rate closely.  We will gradually reintroduce Cardizem.  Hypertension:Blood Pressure well controlled hold Cardizem allow permissive hypertension.  Hyperthyroidism on methimazole -resume once able to take p.o.  Goals of care:  Palliative care team input highly appreciated.      Diet: Dysphagia type 2  DVT Prophylaxis: Subcutaneous Lovenox   Advance goals of care discussion: Full code  Family Communication:   Disposition:  Discharge to be determined.  Consultants: Palliative care, neurology Procedures: Echocardiogram   Antibiotics: Anti-infectives (From admission, onward)   None       Objective: Physical Exam: Vitals:   08/05/19 1632 08/05/19 2145 08/05/19 2320 08/06/19 0820  BP: 137/82 (!) 99/57 (!) 166/80 (!) 178/125  Pulse: 81 87 90 (!) 107  Resp: 18  14 16   Temp: 98.3 F (36.8 C)  98.7 F (37.1 C) 97.9 F (36.6 C)  TempSrc: Oral     SpO2: 99%  100% 99%    Intake/Output Summary (Last 24 hours) at 08/06/2019 1502 Last data filed at 08/06/2019 1436 Gross per 24 hour  Intake 705 ml  Output --  Net 705 ml   There were no vitals filed for this visit. General: Sleepy today.  Not in any distress.   Eyes: No jaundice.  Patient is pale.   Cardiovascular: S1 and S2   Respiratory: Clear to auscultation  Abdomen: Bowel Sound present, Soft and no tenderness, no  hernia Extremities: no Pedal edema Neurologic: Sleepy.  Patient will not follow complete neuro exam.  CBC: Recent Labs  Lab 08/01/19 1037 08/01/19 1850 08/02/19 1128 08/04/19 0943  WBC 10.2 8.2 5.4 8.4  NEUTROABS 8.9*  --  4.2 6.4  HGB 11.4* 11.0* 11.5* 12.4  HCT 37.0 35.7* 36.4 38.5  MCV 93.7 93.9 90.8 89.3  PLT 272 227 218 249   Basic Metabolic Panel: Recent Labs  Lab 08/01/19 1037 08/01/19 1850 08/02/19 1128 08/04/19 0943 08/05/19 1237  NA 143  --  141 139 137  K 3.4*  --  3.7 3.6 4.1   CL 107  --  109 106 107  CO2 26  --  25 24 23   GLUCOSE 138*  --  85 109* 114*  BUN 27*  --  17 11 15   CREATININE 0.83 0.81 0.73 0.83 0.80  CALCIUM 8.8*  --  8.4* 8.5* 8.6*  MG  --   --   --  1.9  --   PHOS  --   --   --  3.1 3.3    Liver Function Tests: Recent Labs  Lab 08/04/19 0943 08/05/19 1237  ALBUMIN 2.5* 2.6*   No results for input(s): LIPASE, AMYLASE in the last 168 hours. No results for input(s): AMMONIA in the last 168 hours. Coagulation Profile: No results for input(s): INR, PROTIME in the last 168 hours. Cardiac Enzymes: No results for input(s): CKTOTAL, CKMB, CKMBINDEX, TROPONINI in the last 168 hours. BNP (last 3 results) No results for input(s): PROBNP in the last 8760 hours. CBG: Recent Labs  Lab 08/05/19 1216 08/05/19 1800 08/06/19 0007 08/06/19 0637 08/06/19 1132  GLUCAP 104* 95 116* 73 92   Studies: No results found.   Time spent: 25 minutes  Author: Berton MountSylvester Sircharles Holzheimer, MD [facility] 08/06/2019 3:02 PM  To reach On-call, see care teams to locate the attending and reach out to them via [email or web address]. If 7PM-7AM, please contact night-coverage If you still have difficulty reaching the attending provider, please page the [facility] (Director on Call) for [facility] on amion for assistance.

## 2019-08-06 NOTE — Consult Note (Addendum)
   [facility] CM Inpatient Consult   08/06/2019  Amy Randall July 18, 1934 [id number]   Patient screened for potential [place] [facility] services in the Medicare [place] [employer] [ACO].   Patient with a history of [facility] in the past however, was unable to establish contact due to unable to leave a message and no return calls. Patient was active with [facility] in the past and was referred for the [facility] First program.  Review of patient's medical record reveals patient is admitted with an unwitnessed fall at [facility] care unit [facility] noted] facility. Reviewed MD brief progress notes as follows:  MRI that showed acute/subacute nonhemorrhagic right PCA territory infarct involving the right occipital lobe, splenium of the corpus callosum, and posterior right hippocampus   Primary Care Provider is Sinda Du, MD  Plan:   Patient is being recommended for a skilled nursing facility stay. Will assess for post hospital care management needs if going to a [facility] affiliated facility will have [facility] RN to follow for transitional needs.  If not at a [facility] affiliated facility, [facility] will not follow disposition. Continue to follow for needs for disposition/transition of care.  Please place a [facility] Care Management consult as appropriate and for questions contact:   Natividad Brood, RN BSN [place] [facility] Liaison  [phone] business mobile phone Toll free office [phone]  Fax number: [phone] [email or web address]

## 2019-08-07 LAB — GLUCOSE, CAPILLARY
Glucose-Capillary: 142 mg/dL — ABNORMAL HIGH (ref 70–99)
Glucose-Capillary: 62 mg/dL — ABNORMAL LOW (ref 70–99)

## 2019-08-07 MED ORDER — ASPIRIN 325 MG PO TABS: 325.0000 mg | ORAL_TABLET | Freq: Every day | ORAL | 0 refills | Status: DC

## 2019-08-07 MED ORDER — APIXABAN 2.5 MG PO TABS: 2.5000 mg | ORAL_TABLET | Freq: Two times a day (BID) | ORAL | 0 refills | Status: AC

## 2019-08-07 MED ORDER — APIXABAN 2.5 MG PO TABS
2.5000 mg | ORAL_TABLET | Freq: Two times a day (BID) | ORAL | Status: DC
  Administered 2019-08-07 – 2019-08-08 (×3): 2.5 mg via ORAL
  Filled 2019-08-07 (×3): qty 1

## 2019-08-07 NOTE — Progress Notes (Signed)
Hydrologist [facility])  Hospital Liaison: RN note    Notified by [facility] [place] of patient/family request for [facility] services at [facility] after discharge. Chart and patient information under review by [facility] physician. Hospice eligibility pending currently.    Writer spoke with daughter, Denisie to initiate education related to hospice philosophy, services and team approach to care. Langley Gauss verbalized understanding of information given. Per discussion, plan is for discharge to [facility] by [place].Marland Kitchen    Please send signed and completed DNR form home with patient/family. Patient will need prescriptions for discharge comfort medications.     DME needs have been discussed, patient currently has the following equipment in the home:  [place].  Patient/family requests the following DME for delivery to the home:  Hospital bed, 3N1. [place] equipment manager has been notified and will contact [facility] to arrange delivery to the ALF. Home address has been verified and is correct in the chart.  [place] to arrange time of delivery.     [facility] aware of the above. Please notify [facility] when patient is ready to leave the unit at discharge. (Call [phone] or [phone] after 5pm.) [facility] information and contact numbers given to [facility].     Please call with any hospice related questions.    Thank you for this referral.    Farrel Gordon, RN, CCM [place] (listed on AMION under Hospice and [place] [facility] [place])  [phone]

## 2019-08-07 NOTE — Discharge Instructions (Signed)

## 2019-08-07 NOTE — Plan of Care (Signed)
Called to patient daughter Lelan Pons. She stated that she has discussed with palliative care and at this time she is leaning towards anticoagulation for stroke prevention. She stated that pt before admission in SNF largely wheelchair bound but intermittent she walks with [place] and sometimes hold onto things for walk without supervision, which put her on higher risk for fall. However, since this admission, she felt that pt will exclusively wheelchair bound with supervision so the fall risk is much less. I also feel that it is safer that before to start anticoagulation for her at this time. I will order eliquis pharmacy consult for eliquis dosing. Discontinue ASA at the meantime. Daughter expressed understanding and appreciation. I also discussed with Dr. Marthenia Rolling and let SW aware. She will follow up with [facility] stroke clinic in 4 weeks.  Rosalin Hawking, MD PhD Stroke Neurology 08/07/2019 12:49 PM

## 2019-08-07 NOTE — NC FL2 (Signed)
  [place] LEVEL OF CARE SCREENING TOOL     IDENTIFICATION  Patient Name: Amy Randall Birthdate: 13-Dec-1933 Sex: female Admission Date (Current Location): 08/01/2019  [facility] and [place] Number:  Herbalist and Address:  [facility], [place] [address], [place], [place] [address]      Provider Number: [id number]  Attending Physician Name and Address:  Bonnell Public, MD  Relative Name and Phone Number:  Arcelia Jew (Daughter)[phone]    Current Level of Care: Hospital Recommended Level of Care: Memory Care Prior Approval Number:    Date Approved/Denied:   PASRR Number:    Discharge Plan: Other (Comment)(Memory care unit)    Current Diagnoses: Patient Active Problem List   Diagnosis Date Noted  . Stroke ([place]) 08/01/2019  . Tachycardia 01/02/2019  . Acute CHF (congestive heart failure) ([place]) 01/02/2019  . Hip fracture ([place]) 12/14/2018  . Essential hypertension 12/14/2018  . Dementia without behavioral disturbance ([place]) 12/14/2018  . AF (paroxysmal atrial fibrillation) ([place]) 12/14/2018  . Hyperglycemia 12/14/2018    Orientation RESPIRATION BLADDER Height & Weight     (pt is alert)  Normal Incontinent Weight:   Height:     BEHAVIORAL SYMPTOMS/MOOD NEUROLOGICAL BOWEL NUTRITION STATUS      Incontinent Diet(Dysphagia 2, thin liquids)  AMBULATORY STATUS COMMUNICATION OF NEEDS Skin   Total Care Verbally Normal                       Personal Care Assistance Level of Assistance  Bathing, Feeding, Dressing Bathing Assistance: Maximum assistance Feeding assistance: Maximum assistance Dressing Assistance: Maximum assistance     Functional Limitations Info  Sight, Hearing, Speech Sight Info: Adequate Hearing Info: Adequate Speech Info: Adequate    SPECIAL CARE FACTORS FREQUENCY                       Contractures Contractures Info: Not present    Additional Factors Info  Code Status,  Allergies Code Status Info: DNR Allergies Info: Bactrim Sulfamethoxazole-trimethoprim, Ibuprofen, Orange Fruit Citrus, Penicillins           Current Medications (08/07/2019):    Discharge Medications: STOP taking these medications   furosemide 20 MG tablet Commonly known as: LASIX   LORazepam 0.5 MG tablet Commonly known as: ATIVAN   potassium chloride 10 MEQ tablet Commonly known as: KLOR-CON   sertraline 50 MG tablet Commonly known as: ZOLOFT     TAKE these medications   apixaban 2.5 MG Tabs tablet Commonly known as: ELIQUIS Take 1 tablet (2.5 mg total) by mouth 2 (two) times daily.   diltiazem 360 MG 24 hr capsule Commonly known as: CARDIZEM CD Take 1 capsule (360 mg total) by mouth daily.   Ensure Take 237 mLs by mouth 3 (three) times daily between meals.   methimazole 5 MG tablet Commonly known as: TAPAZOLE Take 5 mg by mouth daily.   metoprolol tartrate 25 MG tablet Commonly known as: LOPRESSOR Take 1 tablet (25 mg total) by mouth 2 (two) times daily.   rivastigmine 13.3 MG/24HR Commonly known as: EXELON Place 13.3 mg onto the skin daily.    Relevant Imaging Results:  Relevant Lab Results:   Additional Information SS#[id number] [phone], hospice to follow at facility  [facility], LCSW

## 2019-08-07 NOTE — Progress Notes (Signed)
Physical Therapy Treatment Patient Details Name: Amy Randall MRN: [id number] DOB: 03-25-1934 Today's Date: 08/07/2019    History of Present Illness 85 y.o. female admitted on 08/01/19 forfall with R head laceration and bruising.  COVID testing negative.  MRI showed acute/subacute non hemorrhagic R PCA territory infarct involving R occipital lobe, splenium of the corpus callosum and posterior R hippocampus.  Pt with significant PMH of A-fib, dementia, HTN, R femur IM nail (12/2018).      PT Comments    Patient progressing with mobility able to stand with less assist pulling up on Stedy and maintained standing with assist to allow pivot to chair and BSC.  Feel possibly could be managed back at Memory care based on daughter's reports.  PT to follow acutely.   Follow Up Recommendations  SNF;Supervision/Assistance - 24 hour;Other (comment)(daughter prefers back to [facility] for memory care)     Equipment Recommendations  None recommended by PT    Recommendations for Other Services       Precautions / Restrictions Precautions Precautions: Fall Precaution Comments: advance dementia, has had several falls Restrictions Weight Bearing Restrictions: No    Mobility  Bed Mobility Overal bed mobility: Needs Assistance Bed Mobility: Supine to Sit     Supine to sit: Total assist;HOB elevated     General bed mobility comments: assist to initiate movement with LE's and trunk, once seated more interactive and participative  Transfers Overall transfer level: Needs assistance Equipment used: Ambulation equipment used   Sit to Stand: Min assist;+2 safety/equipment Stand pivot transfers: Mod assist;+2 safety/equipment       General transfer comment: assist up from EOB pt pulling up on Stedy; Stand pivot on Stedy without sitting down to recliner, then noted pt soiled so transferred again to [facility] then stood for hygiene and back to recliner,  Assisting with +2 A due to pt leaning  posterior at times, assist for balance and anterior weight shift while another performing hygiene, pt not moving feet with transfers, just using wheels on Stedy for turning her to sit, impulsivley sat x 1 during transfer; +2 for safety  Ambulation/Gait             General Gait Details: unable   Stairs             Wheelchair Mobility    Modified Rankin (Stroke Patients Only) Modified Rankin (Stroke Patients Only) Pre-Morbid Rankin Score: Moderately severe disability Modified Rankin: Severe disability     Balance Overall balance assessment: Needs assistance   Sitting balance-Leahy Scale: Poor Sitting balance - Comments: mod A for sitting balance at EOB due to heavy posterior lean, Postural control: Posterior lean   Standing balance-Leahy Scale: Poor                              Cognition Arousal/Alertness: Lethargic Behavior During Therapy: Flat affect Overall Cognitive Status: History of cognitive impairments - at baseline Area of Impairment: Orientation;Attention;Memory;Following commands;Safety/judgement;Problem solving                 Orientation Level: Disoriented to;Place;Time;Situation Current Attention Level: Focused Memory: Decreased short-term memory Following Commands: Follows one step commands inconsistently;Follows one step commands with increased time Safety/Judgement: Decreased awareness of deficits;Decreased awareness of safety   Problem Solving: Slow processing;Decreased initiation        Exercises      General Comments General comments (skin integrity, edema, etc.): daughter in room and verbalizing hopes pt can  return to Memory Care at [facility].  Patient normally needs assist for OOB to wheelchair and up to toilet on schedule; she wheels with her feet and daughter states staff say she can walk in the evening (more alert and active in evenings), but she has not seen her walk      Pertinent Vitals/Pain Pain  Assessment: No/denies pain    Home Living                      Prior Function            PT Goals (current goals can now be found in the care plan section) Progress towards PT goals: Progressing toward goals    Frequency    Min 2X/week      PT Plan Current plan remains appropriate    Co-evaluation              AM-PAC PT "6 Clicks" Mobility   Outcome Measure  Help needed turning from your back to your side while in a flat bed without using bedrails?: A Lot Help needed moving from lying on your back to sitting on the side of a flat bed without using bedrails?: Total Help needed moving to and from a bed to a chair (including a wheelchair)?: A Lot Help needed standing up from a chair using your arms (e.g., wheelchair or bedside chair)?: A Little Help needed to walk in hospital room?: Total Help needed climbing 3-5 steps with a railing? : Total 6 Click Score: 10    End of Session   Activity Tolerance: Patient tolerated treatment well Patient left: in chair;with call bell/phone within reach;with chair alarm set;with family/visitor present Nurse Communication: Mobility status PT Visit Diagnosis: Other abnormalities of gait and mobility (R26.89);History of falling (Z91.81);Muscle weakness (generalized) (M62.81);Other symptoms and signs involving the nervous system (R29.898)     Time: 7017-7939 PT Time Calculation (min) (ACUTE ONLY): 39 min  Charges:  $Therapeutic Activity: 38-52 mins                     Sheran Lawless, [place] [facility] [phone] 08/07/2019    Amy Randall 08/07/2019, 4:21 PM

## 2019-08-07 NOTE — Discharge Summary (Deleted)
Physician Discharge Summary  Patient ID: Amy DingwallVirginia T Randall MRN: [id number] DOB/AGE: [birth year] 85 y.o.  Admit date: 08/01/2019 Discharge date: 08/07/2019  Admission Diagnoses:  Discharge Diagnoses:  Active Problems:   Stroke [facility](HCC)   Hypokalemia   Fall   Advanced dementia   Atrial fibrillation, likely permanent   Hypertension    Hyperthyroidism  Discharged Condition: stable  Hospital Course: Patient is an 85 year old Caucasian female with past medical history significant for advanceddementia, atrial fibrillation, hypertension and hyperthyroidism.  Patient was admitted from the memory care unit with a fall.  Apparently, patient was found found on the floor by the staff with right forehead laceration and some bleeding.  At baseline patient is [facility], with progressive cognitive decline over the last 2 years.  Patient has had frequent falls.  Patient was admitted for further assessment and management.  Work-up revealed acute/subacute nonhemorrhagic right PCA territory infarct involving the right occipital lobe, splenium of the corpus callosum, and posterior right hippocampus.  Patient was managed for acute CVA.  Neurology team directed patient's care.  Patient will be discharged back to the memory care with hospice follow-up on aspirin.  Patient will follow up with the primary care provider, neurology team and hospice care on discharge.    Acute/subacute nonhemorrhagic right PCA territory infarctinvolving the right occipital lobe, splenium of the corpus callosum, and posterior right hippocampus : -Neurology team directed patient's care.  -Patient will be discharged on aspirin.  -Physical therapy and Occupational Therapy assessed patient, and assisted with patient's management during the hospital stay.  -Patient was also seen by the speech therapy team, and will be discharged on dysphagia 2 diet. -Palliative care team was consulted to assist with goal of  care.  Patient is DO NOT RESUSCITATE.  Patient will be followed up with hospice team on discharge.  Hypokalemia: -Monitor. -Patient is currently on normal saline with KCl. 08/05/2019: BMP done on 08/04/2019 revealed potassium of 3.6.  Will repeat renal panel in the morning.  Concern for Covid infection. -Daughter was concerned that patient may have Covid infection in November. -Covid test came back negative.  Fall at nursing home with laceration: -History of recurrent falls was reported. -Patient will be discharged back home on only aspirin from home. -Further management will be as per patient's primary care provider and neurology team.  Patient has atrial fibrillation.  Advanced dementia:  -Patient was seen by the palliative care team during the hospital stay.   -Patient will be discharged back to the memory unit with hospice follow-up.    Atrial fibrillation likely permanent: -Patient is rate controlled.   -Patient be discharged on only aspirin. -If benefit of anticoagulation outweighs the risk, patient's primary care provider and neurology will kindly discuss with the patient's family and manage accordingly.  Hypertension: -Continue to monitor and optimize.   -Permissive hypertension was followed initially during the hospital stay. -Goal blood pressure should be below 130/80 mmHg, but will avoid significant drop in blood pressure if patient continues to fall.  Hyperthyroidism: -On methimazole      Diet: Dysphagia type 2   Consults: neurology and Palliative care medicine  Significant Diagnostic Studies:  -COVID-19 test came back negative.  MRI/MRA brain revealed: 1. Acute/subacute nonhemorrhagic right PCA territory infarct involving the right occipital lobe, splenium of the corpus callosum, and posterior right hippocampus. 2. Advanced atrophy and diffuse white matter disease likely reflects the sequela of chronic microvascular ischemia. 3. Remote hemorrhagic  lacunar infarcts of the left basal  ganglia and corona radiata. 4. Remote nonhemorrhagic lacunar infarcts of the cerebellum Bilaterally.  CT head without contrast, stroke protocol revealed/CT cervical spine: 1. Lucency with loss of gray-white differentiation is identified in the right occipital lobe suspicious for acute infarct. 2. No focal acute hemorrhage. 3. No acute fracture or dislocation of cervical spine. 4. Degenerative joint changes of cervical spine. 5. Multiple nodules identified in bilateral thyroid gland. This may be due to multinodular goiter.  Treatments: Patient will be discharged on aspirin.  PCP and neurology team, alongside patient's family to discuss option of Eliquis (anticoagulation) if indicated.  Discharge Exam: Blood pressure (!) 151/96, pulse 83, temperature (!) 97.4 F (36.3 C), temperature source Oral, resp. rate 16, SpO2 97 %.   Disposition: Discharge disposition: 03-Skilled Nursing Facility   Discharge Instructions    Diet - low sodium heart healthy   Complete by: As directed    Increase activity slowly   Complete by: As directed      Allergies as of 08/07/2019      Reactions   Bactrim [sulfamethoxazole-trimethoprim] Other (See Comments)   Loss of memory   Ibuprofen Other (See Comments)   Causes bruising   Orange Fruit [citrus]    Per patient's daughter, orange snesitivity   Penicillins Rash   Has patient had a PCN reaction causing immediate rash, facial/tongue/throat swelling, SOB or lightheadedness with hypotension: Yes Has patient had a PCN reaction causing severe rash involving mucus membranes or skin necrosis: No Has patient had a PCN reaction that required hospitalization: No Has patient had a PCN reaction occurring within the last 10 years: No If all of the above answers are "NO", then may proceed with Cephalosporin use.      Medication List    STOP taking these medications   furosemide 20 MG tablet Commonly known as: LASIX    LORazepam 0.5 MG tablet Commonly known as: ATIVAN   potassium chloride 10 MEQ tablet Commonly known as: KLOR-CON   sertraline 50 MG tablet Commonly known as: ZOLOFT     TAKE these medications   aspirin 325 MG tablet Take 1 tablet (325 mg total) by mouth daily. Start taking on: August 08, 2019   diltiazem 360 MG 24 hr capsule Commonly known as: CARDIZEM CD Take 1 capsule (360 mg total) by mouth daily.   Ensure Take 237 mLs by mouth 3 (three) times daily between meals.   methimazole 5 MG tablet Commonly known as: TAPAZOLE Take 5 mg by mouth daily.   metoprolol tartrate 25 MG tablet Commonly known as: LOPRESSOR Take 1 tablet (25 mg total) by mouth 2 (two) times daily.   rivastigmine 13.3 MG/24HR Commonly known as: EXELON Place 13.3 mg onto the skin daily.      Follow-up Information    [facility]. Schedule an appointment as soon as possible for a visit in 4 week(s).   Specialty: Neurology Contact information: [address] [place] [phone]          Signed: Bonnell Public 08/07/2019, 11:07 AM   Addendum to the discharge summary: Neurology team, Dr. Rosalin Hawking, updated neurology recommendation.  As per neurology team, aspirin will be discontinued and patient started on Eliquis (based on patient's daughters insistence).  Pharmacy team was consulted to advise on Eliquis dosing.  Eliquis 2.5 mg p.o. twice daily has been recommend.   Updated neurology note and recommendation is attached below: "Called to patient daughter Lelan Pons. She stated that she has discussed with palliative  care and at this time she is leaning towards anticoagulation for stroke prevention. She stated that pt before admission in SNF largely wheelchair bound but intermittent she walks with [place] and sometimes hold onto things for walk without supervision, which put her on higher risk for fall. However, since this admission, she felt  that pt will exclusively wheelchair bound with supervision so the fall risk is much less. I also feel that it is safer that before to start anticoagulation for her at this time. I will order eliquis pharmacy consult for eliquis dosing. Discontinue ASA at the meantime. Daughter expressed understanding and appreciation. I also discussed with Dr. Dartha Lodge and let SW aware. She will follow up with [facility] stroke clinic in 4 weeks.  Marvel Plan, MD PhD Stroke Neurology 08/07/2019 12:49 PM"  Thanks,  Berton Mount MD.

## 2019-08-07 NOTE — Progress Notes (Signed)
Palliative Care Follow-up  Patient seen with her daughter at bedside. Her functional status seems to be improved today. She is eating and had most of her lunch tray. Other than deconditioning and weakness in her legs her daughter feels like this is close to her baseline. We discussed progression of dementia and goals of care. Additionally confirmed her interest in hospice following discharge from the hospital back to her memory care facility. We discussed the MOST and will complete prior to discharge. DNR, LIMITED INTERVENTIONS, No Feeding tube and Abx and fluids as indicated. Discontinued her q6 CBGs. Answered questions and provided support.  Lane Hacker, DO Palliative Medicine  Time: 35 min Greater than 50%  of this time was spent counseling and coordinating care related to the above assessment and plan.

## 2019-08-07 NOTE — Discharge Summary (Addendum)
Physician Discharge Summary  Patient ID: Amy Randall MRN: [id number] DOB/AGE: 1933-12-03 85 y.o.  Admit date: 08/01/2019 Discharge date: 08/08/2019  Admission Diagnoses:  Discharge Diagnoses:  Active Problems:  Stroke [facility])   Hypokalemia   Fall   Advanced dementia   Atrial fibrillation, likely permanent   Hypertension    Hyperthyroidism  Discharged Condition: stable  Hospital Course: Patient is an 85 year old Caucasian female with past medical history significant for advanceddementia, atrial fibrillation, hypertension and hyperthyroidism.  Patient was admitted from the memory care unit with a fall.  Apparently, patient was found found on the floor by the staff with right forehead laceration and some bleeding.  At baseline patient is documented to be oriented to [facility], with progressive cognitive decline over the last 2 years.  Patient has had frequent falls.  Patient was admitted for further assessment and management.  Work-up revealed acute/subacute nonhemorrhagic right PCA territory infarct involving the right occipital lobe, splenium of the corpus callosum, and posterior right hippocampus.  Patient was managed for acute CVA.  Neurology team directed patient's care.  Patient will be discharged back to the memory care with hospice follow-up.  Patient will follow up with the primary care provider, neurology team and hospice care on discharge.    Acute/subacute nonhemorrhagic right PCA territory infarctinvolving the right occipital lobe, splenium of the corpus callosum, and posterior right hippocampus : -Neurology team directed patient's care.  -Physical therapy and Occupational Therapy assessed patient, and assisted with patient's management during the hospital stay.  -Patient was also seen by the speech therapy team, and will be discharged on dysphagia 2 diet. -Palliative care team was consulted to assist with goal of care.  Patient is DO NOT RESUSCITATE.  Patient will be  followed up with hospice team on discharge.  Hypokalemia: -Monitor and replete.   Concern for Covid infection. -Daughter was concerned that patient may have Covid infection in November. -Covid test came back negative.  Fall at nursing home with laceration: -History of recurrent falls was reported. -Further management will be as per patient's primary care provider and neurology team.  Patient has atrial fibrillation.  Advanced dementia:  -Patient was seen by the palliative care team during the hospital stay.   -Patient will be discharged back to the memory unit with hospice follow-up.    Atrial fibrillation likely permanent: -Patient is rate controlled.   -If benefit of anticoagulation outweighs the risk, patient's primary care provider and neurology will kindly discuss with the patient's family and manage accordingly. (Please see the addendum below)  Hypertension: -Continue to monitor and optimize.   -Permissive hypertension was followed initially during the hospital stay. -Goal blood pressure should be below 130/80 mmHg, but will avoid significant drop in blood pressure if patient continues to fall.  Hyperthyroidism: -On methimazole   Diet: Dysphagia type 2  Consults: neurology and Palliative care medicine  Significant Diagnostic Studies:  -COVID-19 test came back negative.  MRI/MRA brain revealed: 1. Acute/subacute nonhemorrhagic right PCA territory infarct involving the right occipital lobe, splenium of the corpus callosum, and posterior right hippocampus. 2. Advanced atrophy and diffuse white matter disease likely reflects the sequela of chronic microvascular ischemia. 3. Remote hemorrhagic lacunar infarcts of the left basal ganglia and corona radiata. 4. Remote nonhemorrhagic lacunar infarcts of the cerebellum Bilaterally.  CT head without contrast, stroke protocol revealed/CT cervical spine: 1. Lucency with loss of gray-white differentiation is  identified in the right occipital lobe suspicious for acute infarct. 2. No focal acute hemorrhage. 3. No  acute fracture or dislocation of cervical spine. 4. Degenerative joint changes of cervical spine. 5. Multiple nodules identified in bilateral thyroid gland. This may be due to multinodular goiter.  Treatments: Patient will be discharged on aspirin.  PCP and neurology team, alongside patient's family to discuss option of Eliquis (anticoagulation) if indicated.  Discharge Exam: Blood pressure (!) 151/96, pulse 83, temperature (!) 97.4 F (36.3 C), temperature source Oral, resp. rate 16, SpO2 97 %.   Disposition: Discharge disposition: 03-Skilled Nursing Facility   Discharge Instructions    Ambulatory referral to Neurology   Complete by: As directed    Follow up with stroke clinic NP (Jessica Vanschaick or Darrol Angel, if both not available, consider Manson Allan, or [facility]) at [facility] in about 4 weeks. Thanks.   DIET DYS 2   Complete by: As directed    Diet recommendations: Dysphagia 2 (fine chop);Thin liquid Liquids provided via: Cup;Straw Medication Administration: Whole meds with puree Supervision: Patient able to self feed;Full supervision/cueing for compensatory strategies Compensations: Minimize environmental distractions;Slow rate;Small sips/bites Postural Changes and/or Swallow Maneuvers: Seated upright 90 degrees   Fluid consistency: Thin   Diet - low sodium heart healthy   Complete by: As directed    Increase activity slowly   Complete by: As directed    Increase activity slowly   Complete by: As directed      Allergies as of 08/08/2019      Reactions   Bactrim [sulfamethoxazole-trimethoprim] Other (See Comments)   Loss of memory   Ibuprofen Other (See Comments)   Causes bruising   Orange Fruit [citrus]    Per patient's daughter, orange snesitivity   Penicillins Rash   Has patient had a PCN reaction causing immediate rash, facial/tongue/throat swelling,  SOB or lightheadedness with hypotension: Yes Has patient had a PCN reaction causing severe rash involving mucus membranes or skin necrosis: No Has patient had a PCN reaction that required hospitalization: No Has patient had a PCN reaction occurring within the last 10 years: No If all of the above answers are "NO", then may proceed with Cephalosporin use.      Medication List    STOP taking these medications   furosemide 20 MG tablet Commonly known as: LASIX   LORazepam 0.5 MG tablet Commonly known as: ATIVAN   potassium chloride 10 MEQ tablet Commonly known as: KLOR-CON   sertraline 50 MG tablet Commonly known as: ZOLOFT     TAKE these medications   apixaban 2.5 MG Tabs tablet Commonly known as: ELIQUIS Take 1 tablet (2.5 mg total) by mouth 2 (two) times daily.   diltiazem 360 MG 24 hr capsule Commonly known as: CARDIZEM CD Take 1 capsule (360 mg total) by mouth daily.   Ensure Take 237 mLs by mouth 3 (three) times daily between meals.   methimazole 5 MG tablet Commonly known as: TAPAZOLE Take 5 mg by mouth daily.   metoprolol tartrate 25 MG tablet Commonly known as: LOPRESSOR Take 1 tablet (25 mg total) by mouth 2 (two) times daily.   rivastigmine 13.3 MG/24HR Commonly known as: EXELON Place 13.3 mg onto the skin daily.      Follow-up Information    [facility]. Schedule an appointment as soon as possible for a visit in 4 week(s).   Specialty: Neurology Contact information: [address] [place] [address] [phone]          Signed: Reva Bores 08/08/2019, 9:22 AM   Addendum to the discharge summary: Neurology  team, Dr. Marvel PlanJindong Xu, updated neurology recommendation.  As per neurology team, aspirin will be discontinued and patient started on Eliquis (based on patient's daughters insistence).  Pharmacy team was consulted to advise on Eliquis dosing.  Eliquis 2.5 mg p.o. twice daily has been recommend.    Updated neurology note and recommendation is attached below: "Called to patient daughter Milon DikesDanise. She stated that she has discussed with palliative care and at this time she is leaning towards anticoagulation for stroke prevention. She stated that pt before admission in SNF largely wheelchair bound but intermittent she walks with Gemme and sometimes hold onto things for walk without supervision, which put her on higher risk for fall. However, since this admission, she felt that pt will exclusively wheelchair bound with supervision so the fall risk is much less. I also feel that it is safer that before to start anticoagulation for her at this time. I will order eliquis pharmacy consult for eliquis dosing. Discontinue ASA at the meantime. Daughter expressed understanding and appreciation. I also discussed with Dr. Dartha Lodgegbata and let SW aware. She will follow up with [facility] stroke clinic in 4 weeks.  Marvel PlanJindong Xu, MD PhD Stroke Neurology 08/07/2019 12:49 PM"  Thanks,  Berton MountSylvester ogbata MD.  No change in status overnight. Continues to be stable for discharge.

## 2019-08-07 NOTE — Care Management (Addendum)
[place] with [facility] ask NCM to provide patient with 30 day free card for Eliquis. Spoke with daughter and gave card to daughter.   [place] with [place] from [facility]. Matthew Saras confirmed with daughter Langley Gauss she wants hospice, they will take patient back , however a hospital bed must be delivered first. NCM spoke with Langley Gauss offered choice for hospice, she wants [facility]. Left [facility] [employer]. Also called office of [place] spoke to [employer]. Referral given to [employer]. Amber aware hospital bed needed prior to discharge.   DR Marthenia Rolling , and bedside nurse [facility] aware.   [place] at [facility] will call daughter and than call NCM back.   Handoff from prior NCM was plan is to go back to [facility] with hospice following. Called daughter Langley Gauss [phone] to confirm left voicemail.   Daughter Langley Gauss returned call. She does want her mother to return to [facility] with hospice. Denise aware NCM waiting to hear back from [place]. Langley Gauss requesting MD to call her directly. [place] [phone] , spoke with [place] [phone]). [place] requesting PT evaluation note , discharge summary and FL2 be faxed to her, to see if they can accept patient back with hospice. Awaiting call back.

## 2019-08-07 NOTE — Progress Notes (Signed)
ANTICOAGULATION CONSULT NOTE - Initial Consult  Pharmacy Consult for apixaban Indication: stroke  Allergies  Allergen Reactions  . Bactrim [Sulfamethoxazole-Trimethoprim] Other (See Comments)    Loss of memory  . Ibuprofen Other (See Comments)    Causes bruising  . Orange Fruit [Citrus]     Per patient's daughter, orange snesitivity  . Penicillins Rash    Has patient had a PCN reaction causing immediate rash, facial/tongue/throat swelling, SOB or lightheadedness with hypotension: Yes Has patient had a PCN reaction causing severe rash involving mucus membranes or skin necrosis: No Has patient had a PCN reaction that required hospitalization: No Has patient had a PCN reaction occurring within the last 10 years: No If all of the above answers are "NO", then may proceed with Cephalosporin use.     Patient Measurements:   Weight 45 kg per RN  Vital Signs: Temp: 97.4 F (36.3 C) (12/31 0824) Temp Source: Oral (12/31 0824) BP: 151/96 (12/31 0824) Pulse Rate: 83 (12/31 0824)  Labs: Recent Labs    08/05/19 1237  CREATININE 0.80    CrCl cannot be calculated (Unknown ideal weight.).  Assessment: 85 yo f presenting with stroke - being sent home with palliative care, but want anticoagulation for stroke prevent  CHA2DS2/VAS Stroke Risk Points  Current as of 8 minutes ago     8 >= 2 Points: High Risk  1 - 1.99 Points: Medium Risk  0 Points: Low Risk    The previous score was 6 on 02/06/2019.: Last Change:     Details    This score determines the patient's risk of having a stroke if the  patient has atrial fibrillation.       Points Metrics  1 Has Congestive Heart Failure:  Yes    Current as of 8 minutes ago  1 Has Vascular Disease:  Yes     Current as of 8 minutes ago  1 Has Hypertension:  Yes    Current as of 8 minutes ago  2 Age:  85    Current as of 8 minutes ago  0 Has Diabetes:  No    Current as of 8 minutes ago  2 Had Stroke:  Yes  Had TIA:  No  Had  thromboembolism:  No    Current as of 8 minutes ago  1 Female:  Yes    Current as of 8 minutes ago     SCr 0.8, Age 85, Weight 45 kg  Plan:  apixaban 2.5 mg bid  Barth Kirks, PharmD, BCPS, BCCCP Clinical Pharmacist [phone]  Please check AMION for all [place] numbers  08/07/2019 1:16 PM

## 2019-08-08 ENCOUNTER — Encounter (HOSPITAL_COMMUNITY): Payer: Self-pay | Admitting: Internal Medicine

## 2019-08-08 ENCOUNTER — Other Ambulatory Visit: Payer: Self-pay

## 2019-08-08 LAB — SARS CORONAVIRUS 2 (TAT 6-24 HRS): SARS Coronavirus 2: NEGATIVE

## 2019-08-08 NOTE — Progress Notes (Signed)
[employer] at [phone] and gave report to [place]. PTAR at bedside to transport.

## 2019-08-08 NOTE — TOC Progression Note (Addendum)
Transition of Care [facility]) - Progression Note    Patient Details  Name: Amy Randall MRN: [id number] Date of Birth: 1933/12/05  Transition of Care [facility]) CM/SW [place], [place] Phone Number: 08/08/2019, 10:20 AM  Clinical Narrative:   CSW attempting to reach [facility], called several times with no answer. CSW attempted to reach [place], number saved in phone from previous contact, no answer and unable to leave a voicemail; sent a text message. CSW called daughter, Langley Gauss, to discuss difficulty in reaching [facility]. Langley Gauss provided name and number for [place], who she spoke with yesterday. CSW called [place], no answer, left a voicemail.  CSW to send patient back to [facility] if able to get them on the phone to confirm DME was delivered and they are ready to accept the patient back.   UPDATE 12:45 PM: CSW got in touch with Baldo Ash, nurse at [facility], to assist with patient returning today. Per Baldo Ash, equipment has not yet been delivered. CSW updated Jen with [facility], and Jen to follow up on DME. CSW faxed discharge information to [facility] so they have her paperwork. CSW met with daughter, Langley Gauss, at bedside to explain barriers to discharge. CSW to follow.  UPDATE 4:47 PM: CSW received update from [place] with [facility] that equipment should be delivered around 4:30. CSW spoke with daughter, Langley Gauss, and she confirmed that her brother had called them to make sure that the equipment would be delivered today. CSW then attempted to get in touch with [facility] to ensure that they could take patient after equipment delivered so late in the day. CSW called seven times, with no answer. CSW contacted [place] and left a voicemail, awaiting call back. CSW updated RN with barrier to discharge. If [facility] can be reached to confirm that they have the equipment and that they can admit the patient this evening, CSW to set up transport.   Expected Discharge Plan:  Assisted Living([facility] with hospice care to follow) Barriers to Discharge: Continued Medical Work up  Expected Discharge Plan and Services Expected Discharge Plan: Assisted Living([facility] with hospice care to follow)         Expected Discharge Date: 08/08/19                                     Social Determinants of Health (SDOH) Interventions    Readmission Risk Interventions Readmission Risk Prevention Plan 01/04/2019  Transportation Screening Complete  PCP or Specialist Appt within 5-7 Days Complete  Home Care Screening Complete  Medication Review (RN CM) Complete  Some recent data might be hidden

## 2019-08-08 NOTE — TOC Transition Note (Signed)
Transition of Care [facility]) - CM/SW Discharge Note   Patient Details  Name: JANDA CARGO MRN: [id number] Date of Birth: May 07, 1934  [facility]) CM/SW Contact:  Baldemar Lenis, LCSW Phone Number: 08/08/2019, 5:26 PM   Clinical Narrative:   Nurse to call report to [phone]    Final next level of care: Memory Care Barriers to Discharge: Barriers Resolved   Patient Goals and CMS Choice        Discharge Placement              Patient chooses bed at: [facility]) Patient to be transferred to facility by: PTAR Name of family member notified: [name] Patient and family notified of of transfer: 08/08/19  Discharge Plan and Services                                     Social Determinants of Health (SDOH) Interventions     Readmission Risk Interventions Readmission Risk Prevention Plan 01/04/2019  Transportation Screening Complete  PCP or Specialist Appt within 5-7 Days Complete  Home Care Screening Complete  Medication Review (RN CM) Complete  Some recent data might be hidden

## 2019-08-08 NOTE — Progress Notes (Signed)
PROGRESS NOTE    Amy Randall  UKG:[id number] DOB: [birth year] DOA: 08/01/2019 PCP: [name], MD   Brief Narrative:  Amy Randall a 86 y.o.femalewith medical history significant fordementia, atrial fibrillation, hypertension, hyperthyroidism sent from nursing home due to fall.Patient is unable to get history obtained from the ER chart ED physicianandfrom patient's daughter over the phone.Patient was found found on the floor by the staff with right forehead laceration and some bleeding.At baseline she is alert to persononly,and has been having acutely progressive mental decline for the past 2 years and has had frequent falls at the facility.No report of fever,respiratory distressorvomiting ordiarrhea.   InED:Blood pressurestable,heart rateIn A fib rate controlled ,saturating 99% on room air,lowtemperature 97.4,lab work with potassium 3.4, BUN 27, troponin VI, WBC stable,rapid Covid antigen negative but Covid PCR pending. Initially had CT head and CT C-spine subsequently MRI that showed acute/subacute nonhemorrhagic right PCA territory infarct involving the right occipital lobe, splenium of the corpus callosum, and posterior right hippocampusand advanced atrophy and diffuse white matter disease,Remote hemorrhagic lacunar infarcts of the left basal ganglia and corona radiata Remote nonhemorrhagic lacunar infarcts of the cerebellum bilaterally."Patient received Versed prior to MRI, on my evaluation patient is lethargic able to withdraw right leg to pain, on room air saturating well. Dr. Amada Jupiter from neurology was consulted advised admission to [facility] for further stroke work-up  08/03/2019: Patient seen alongside a physical therapist.  Patient seems not eager to speak to [place] today.  Patient also a bit sleepy.  Apparently, patient just tried some physical therapy with associated significant tachycardia/A. fib RVR.  Rate went up to the 130s.  Systolic blood  pressures 156 mmHg.  Otherwise, no new changes.  08/05/2019: Patient seen alongside patient's daughter.  Updated patient's daughter.  Also outside patient's daughter's questions.  Patient's daughter wants patient to be on Tylenol around-the-clock for a couple of days.  Patient is daughter has opted to discuss anticoagulation further with the neurologist.  Apparently, patient's daughter tells me that she and her brother will prefer patient to remain on anticoagulation despite known risk of anticoagulation.  As documented above, patient's daughter will discuss further with neurology team.  08/06/2019: Patient seen.  Patient is resting quietly.  Heart rate is minimally elevated at 107 bpm.  Elevated blood pressure, and the last documented blood pressure was 178/125 mmHg.  Will increase Lopressor to 50 Mg p.o. twice daily.  Will start optimize blood pressure cautiously.  Likely discharge in the next 1 to 2 days.  Assessment & Plan:   Active Problems:   Stroke [facility])  Acute/subacute nonhemorrhagic right PCA territory infarctinvolving the right occipital lobe, splenium of the corpus callosum, and posterior right hippocampus : -Neurology is directing care.   -Currently on aspirin suppository. -PT OT input is appreciated.   -Patient has advanced dementia, and was seen in a memory care unit.  -Speech therapy input is appreciated, dysphagia 2 diet recommended. -We will consult the palliative care team.  There is need to discuss goal of care and CODE STATUS.  --discussed pt. Is DNR with Hospice care -Continue to monitor heart rate and blood pressure.   -Continue permissive hypertension.     08/05/2019: Patient is more awake and alert today.  Patient was all extremities.  Patient remains significantly weak.  Continue PT OT for now. 08/06/2019: Patient continues to improve.  Likely discharge in the next 1 to 2 days.  Cautiously optimize blood pressure  Hypokalemia: -Monitor. -Patient is currently  on normal saline  with KCl. 08/05/2019: BMP done on 08/04/2019 revealed potassium of 3.6.  Will repeat renal panel in the morning. 08/06/2019: This is resolved.  Concern for Covid infection. Daughter is concerned that patient may have Covid infection in November. ED as well as nursing home is concerned that the patient may have a Covid infection as well. Patient is from [facility] memory care unit which actually has Covid positive patients. Patient's Covid test is negative as well as Covid antibody test is negative and does not have any respiratory illness on examination nor has any evidence of infiltrates on the chest x-ray therefore I will not give isolating the patient.  08/03/2019: Covid tests have come back negative.   Fall at nursing home with laceration, Unwitnessed. Apparently patient does have multiple falls at home. Because of that reason patient is not a candidate for anticoagulation for A. fib. CT head and MRI noted as above.  Advanced dementia: Continue supportive care fall precaution.  Palliative care consult 08/08/2019 Discharge with hospice follow-up.  Atrial fibrillation likely permanent,known history,noted inEKG, rate is stable at times slow ventricuarrate.Not on anticoagulation,at nursing home,dueto her frequent falls. 08/05/2019: Heart rate is controlled.  Patient's daughter will discuss anticoagulation further with neurology. 08/06/2019: Heart rate is 107 bpm.  We will increase the dose of Lopressor.  Monitor heart rate closely.  We will gradually reintroduce Cardizem. 08/08/2019 - on Eliquis per Neurology and patient's daughter, started on 12/31--rx sent in.  Hypertension:BloodPressure well controlled hold Cardizem allow permissive hypertension.  Hyperthyroidismon methimazole -resumeonce able to take p.o.  Goals of care: Palliative care team input highly appreciated.      Diet: Dysphagia type 2     DVT prophylaxis: Eliquis Code Status:  DNR/DNI Family Communication:  Disposition Plan: back to Memory care at [facility] place with Hospice   Consultants:   Palliative care  Neurology  PT  OT  SLP  Hospice  Procedures:   Echo  Carotid Dopplers   Antimicrobials: None  Subjective: Awake and alert. Responds today  Objective: Vitals:   08/07/19 0824 08/07/19 1628 08/07/19 2232 08/08/19 0818  BP: (!) 151/96 (!) 148/97 132/82 (!) 161/100  Pulse: 83 78 96 81  Resp: 16 16 14 16   Temp: (!) 97.4 F (36.3 C) 98.2 F (36.8 C) 97.6 F (36.4 C) (!) 97.5 F (36.4 C)  TempSrc: Oral  Axillary Oral  SpO2: 97% 99% 98% 100%    Intake/Output Summary (Last 24 hours) at 08/08/2019 10/06/2019 Last data filed at 08/08/2019 0400 Gross per 24 hour  Intake 4749.66 ml  Output --  Net 4749.66 ml   There were no vitals filed for this visit.  Examination:  General exam: Appears calm and comfortable  Respiratory system: Clear to auscultation. Respiratory effort normal. Cardiovascular system: S1 & S2 heard, irregular.  Gastrointestinal system: Abdomen is nondistended, soft and nontender. No organomegaly or masses felt. Normal bowel sounds heard. Central nervous system: awake and not oriented Extremities: no edema Skin: No rashes, lesions or ulcers  Data Reviewed: I have personally reviewed following labs and imaging studies  CBC: Recent Labs  Lab 08/01/19 1037 08/01/19 1850 08/02/19 1128 08/04/19 0943  WBC 10.2 8.2 5.4 8.4  NEUTROABS 8.9*  --  4.2 6.4  HGB 11.4* 11.0* 11.5* 12.4  HCT 37.0 35.7* 36.4 38.5  MCV 93.7 93.9 90.8 89.3  PLT 272 227 218 249   Basic Metabolic Panel: Recent Labs  Lab 08/01/19 1037 08/01/19 1850 08/02/19 1128 08/04/19 0943 08/05/19 1237  NA 143  --  141 139 137  K 3.4*  --  3.7 3.6 4.1  CL 107  --  109 106 107  CO2 26  --  25 24 23   GLUCOSE 138*  --  85 109* 114*  BUN 27*  --  17 11 15   CREATININE 0.83 0.81 0.73 0.83 0.80  CALCIUM 8.8*  --  8.4* 8.5* 8.6*  MG  --   --   --  1.9   --   PHOS  --   --   --  3.1 3.3   GFR: CrCl cannot be calculated (Unknown ideal weight.). Liver Function Tests: Recent Labs  Lab 08/04/19 0943 08/05/19 1237  ALBUMIN 2.5* 2.6*   CBG: Recent Labs  Lab 08/06/19 0007 08/06/19 0637 08/06/19 1132 08/07/19 0040 08/07/19 0638  GLUCAP 116* 73 92 142* 62*   Urine analysis:    Component Value Date/Time   COLORURINE YELLOW 01/02/2019 1022   APPEARANCEUR CLEAR 01/02/2019 1022   LABSPEC 1.011 01/02/2019 1022   PHURINE 7.0 01/02/2019 1022   GLUCOSEU NEGATIVE 01/02/2019 1022   HGBUR NEGATIVE 01/02/2019 Home 01/02/2019 1022   [place] 01/02/2019 1022   PROTEINUR NEGATIVE 01/02/2019 1022   NITRITE NEGATIVE 01/02/2019 1022   LEUKOCYTESUR NEGATIVE 01/02/2019 1022   Sepsis Labs:  Recent Results (from the past 240 hour(s))  Respiratory Panel by RT PCR (Flu A&B, Covid) - Nasopharyngeal Swab     Status: None   Collection Time: 08/01/19  4:34 PM   Specimen: Nasopharyngeal Swab  Result Value Ref Range Status   SARS Coronavirus 2 by RT PCR NEGATIVE NEGATIVE Final    Comment: (NOTE) SARS-CoV-2 target nucleic acids are NOT DETECTED. The SARS-CoV-2 RNA is generally detectable in upper respiratoy specimens during the acute phase of infection. The lowest concentration of SARS-CoV-2 viral copies this assay can detect is 131 copies/mL. A negative result does not preclude SARS-Cov-2 infection and should not be used as the sole basis for treatment or other patient management decisions. A negative result may occur with  improper specimen collection/handling, submission of specimen other than nasopharyngeal swab, presence of viral mutation(s) within the areas targeted by this assay, and inadequate number of viral copies (<131 copies/mL). A negative result must be combined with clinical observations, patient history, and epidemiological information. The expected result is Negative. Fact Sheet for Patients:   PinkCheek.be Fact Sheet for Healthcare Providers:  GravelBags.it This test is not yet ap proved or cleared by the [place] FDA and  has been authorized for detection and/or diagnosis of SARS-CoV-2 by FDA under an Emergency Use Authorization (EUA). This EUA will remain  in effect (meaning this test can be used) for the duration of the COVID-19 declaration under Section 564(b)(1) of the Act, 21 U.S.C. section 360bbb-3(b)(1), unless the authorization is terminated or revoked sooner.    Influenza A by PCR NEGATIVE NEGATIVE Final   Influenza B by PCR NEGATIVE NEGATIVE Final    Comment: (NOTE) The Xpert Xpress SARS-CoV-2/FLU/RSV assay is intended as an aid in  the diagnosis of influenza from Nasopharyngeal swab specimens and  should not be used as a sole basis for treatment. Nasal washings and  aspirates are unacceptable for Xpert Xpress SARS-CoV-2/FLU/RSV  testing. Fact Sheet for Patients: PinkCheek.be Fact Sheet for Healthcare Providers: GravelBags.it This test is not yet approved or cleared by the [place] FDA and  has been authorized for detection and/or diagnosis of SARS-CoV-2 by  FDA under an Emergency Use Authorization (EUA). This EUA will  remain  in effect (meaning this test can be used) for the duration of the  Covid-19 declaration under Section 564(b)(1) of the Act, 21  U.S.C. section 360bbb-3(b)(1), unless the authorization is  terminated or revoked. Performed at [facility], [address] [place], [place] [address]   SARS CORONAVIRUS 2 (TAT 6-24 HRS) Nasopharyngeal Nasopharyngeal Swab     Status: None   Collection Time: 08/06/19  5:35 PM   Specimen: Nasopharyngeal Swab  Result Value Ref Range Status   SARS Coronavirus 2 NEGATIVE NEGATIVE Final    Comment: (NOTE) SARS-CoV-2 target nucleic acids are NOT DETECTED. The SARS-CoV-2  RNA is generally detectable in upper and lower respiratory specimens during the acute phase of infection. Negative results do not preclude SARS-CoV-2 infection, do not rule out co-infections with other pathogens, and should not be used as the sole basis for treatment or other patient management decisions. Negative results must be combined with clinical observations, patient history, and epidemiological information. The expected result is Negative. Fact Sheet for Patients: HairSlick.no Fact Sheet for Healthcare Providers: quierodirigir.com This test is not yet approved or cleared by the [place] FDA and  has been authorized for detection and/or diagnosis of SARS-CoV-2 by FDA under an Emergency Use Authorization (EUA). This EUA will remain  in effect (meaning this test can be used) for the duration of the COVID-19 declaration under Section 56 4(b)(1) of the Act, 21 U.S.C. section 360bbb-3(b)(1), unless the authorization is terminated or revoked sooner. Performed at [facility], [address] [place], [place] [address]   SARS CORONAVIRUS 2 (TAT 6-24 HRS) Nasopharyngeal Nasopharyngeal Swab     Status: None   Collection Time: 08/07/19  3:25 PM   Specimen: Nasopharyngeal Swab  Result Value Ref Range Status   SARS Coronavirus 2 NEGATIVE NEGATIVE Final    Comment: (NOTE) SARS-CoV-2 target nucleic acids are NOT DETECTED. The SARS-CoV-2 RNA is generally detectable in upper and lower respiratory specimens during the acute phase of infection. Negative results do not preclude SARS-CoV-2 infection, do not rule out co-infections with other pathogens, and should not be used as the sole basis for treatment or other patient management decisions. Negative results must be combined with clinical observations, patient history, and epidemiological information. The expected result is Negative. Fact Sheet for  Patients: HairSlick.no Fact Sheet for Healthcare Providers: quierodirigir.com This test is not yet approved or cleared by the [place] FDA and  has been authorized for detection and/or diagnosis of SARS-CoV-2 by FDA under an Emergency Use Authorization (EUA). This EUA will remain  in effect (meaning this test can be used) for the duration of the COVID-19 declaration under Section 56 4(b)(1) of the Act, 21 U.S.C. section 360bbb-3(b)(1), unless the authorization is terminated or revoked sooner. Performed at [facility], [address] [place], [place] [address]     Scheduled Meds: .  stroke: mapping our early stages of recovery book   Does not apply Once  . apixaban  2.5 mg Oral BID  . metoprolol tartrate  50 mg Oral BID  . rivastigmine  13.3 mg Transdermal Daily   Continuous Infusions: . 0.9 % NaCl with KCl 20 mEq / L 75 mL/hr at 08/07/19 1740     LOS: 7 days    Time spent: 32   Reva Bores, MD [facility] Pager [phone]  If 7PM-7AM, please contact night-coverage [email or web address] Password [facility] 08/08/2019, 9:23 AM

## 2019-08-08 NOTE — Progress Notes (Addendum)
Civil engineer, contracting Documentation  Liaison notes that dc is in place for today. According to online DME portal, DME has been delivered. Writer called [employer] x2 with no answer in attempt to confirm DME delivery.   Please send pt back to facility with DNR paperwork and any new rxs needed. ACC will contact family upon discharge to set up admission visit.   Please call with any questions.   1630 update: Writer attempted to call [facility] four times with no answer. Then called [employer] company who stated that DME has been delivered.   Thank you,  Trena Platt, RN [facility] Liaison [phone]

## 2019-08-13 DIAGNOSIS — F0390 Unspecified dementia without behavioral disturbance: Secondary | ICD-10-CM | POA: Diagnosis not present

## 2019-08-13 DIAGNOSIS — I2510 Atherosclerotic heart disease of native coronary artery without angina pectoris: Secondary | ICD-10-CM | POA: Diagnosis not present

## 2019-08-13 DIAGNOSIS — R627 Adult failure to thrive: Secondary | ICD-10-CM | POA: Diagnosis not present

## 2019-08-13 DIAGNOSIS — E0590 Thyrotoxicosis, unspecified without thyrotoxic crisis or storm: Secondary | ICD-10-CM | POA: Diagnosis not present

## 2019-08-13 DIAGNOSIS — I639 Cerebral infarction, unspecified: Secondary | ICD-10-CM | POA: Diagnosis not present

## 2019-08-13 DIAGNOSIS — G309 Alzheimer's disease, unspecified: Secondary | ICD-10-CM | POA: Diagnosis not present

## 2019-08-15 DIAGNOSIS — I69318 Other symptoms and signs involving cognitive functions following cerebral infarction: Secondary | ICD-10-CM | POA: Diagnosis not present

## 2019-08-15 DIAGNOSIS — F0150 Vascular dementia without behavioral disturbance: Secondary | ICD-10-CM | POA: Diagnosis not present

## 2019-08-15 DIAGNOSIS — I10 Essential (primary) hypertension: Secondary | ICD-10-CM | POA: Diagnosis not present

## 2019-08-15 DIAGNOSIS — I4891 Unspecified atrial fibrillation: Secondary | ICD-10-CM | POA: Diagnosis not present

## 2019-08-15 DIAGNOSIS — E0590 Thyrotoxicosis, unspecified without thyrotoxic crisis or storm: Secondary | ICD-10-CM | POA: Diagnosis not present

## 2019-08-16 DIAGNOSIS — I69318 Other symptoms and signs involving cognitive functions following cerebral infarction: Secondary | ICD-10-CM | POA: Diagnosis not present

## 2019-08-16 DIAGNOSIS — I10 Essential (primary) hypertension: Secondary | ICD-10-CM | POA: Diagnosis not present

## 2019-08-16 DIAGNOSIS — I4891 Unspecified atrial fibrillation: Secondary | ICD-10-CM | POA: Diagnosis not present

## 2019-08-16 DIAGNOSIS — E0590 Thyrotoxicosis, unspecified without thyrotoxic crisis or storm: Secondary | ICD-10-CM | POA: Diagnosis not present

## 2019-08-16 DIAGNOSIS — F0150 Vascular dementia without behavioral disturbance: Secondary | ICD-10-CM | POA: Diagnosis not present

## 2019-08-19 DIAGNOSIS — I4891 Unspecified atrial fibrillation: Secondary | ICD-10-CM | POA: Diagnosis not present

## 2019-08-19 DIAGNOSIS — E0590 Thyrotoxicosis, unspecified without thyrotoxic crisis or storm: Secondary | ICD-10-CM | POA: Diagnosis not present

## 2019-08-19 DIAGNOSIS — I69318 Other symptoms and signs involving cognitive functions following cerebral infarction: Secondary | ICD-10-CM | POA: Diagnosis not present

## 2019-08-19 DIAGNOSIS — I10 Essential (primary) hypertension: Secondary | ICD-10-CM | POA: Diagnosis not present

## 2019-08-19 DIAGNOSIS — F0150 Vascular dementia without behavioral disturbance: Secondary | ICD-10-CM | POA: Diagnosis not present

## 2019-08-20 DIAGNOSIS — E0590 Thyrotoxicosis, unspecified without thyrotoxic crisis or storm: Secondary | ICD-10-CM | POA: Diagnosis not present

## 2019-08-20 DIAGNOSIS — I69318 Other symptoms and signs involving cognitive functions following cerebral infarction: Secondary | ICD-10-CM | POA: Diagnosis not present

## 2019-08-20 DIAGNOSIS — F0150 Vascular dementia without behavioral disturbance: Secondary | ICD-10-CM | POA: Diagnosis not present

## 2019-08-20 DIAGNOSIS — I4891 Unspecified atrial fibrillation: Secondary | ICD-10-CM | POA: Diagnosis not present

## 2019-08-20 DIAGNOSIS — I10 Essential (primary) hypertension: Secondary | ICD-10-CM | POA: Diagnosis not present

## 2019-08-27 DIAGNOSIS — I4891 Unspecified atrial fibrillation: Secondary | ICD-10-CM | POA: Diagnosis not present

## 2019-08-27 DIAGNOSIS — I10 Essential (primary) hypertension: Secondary | ICD-10-CM | POA: Diagnosis not present

## 2019-08-27 DIAGNOSIS — I69318 Other symptoms and signs involving cognitive functions following cerebral infarction: Secondary | ICD-10-CM | POA: Diagnosis not present

## 2019-08-27 DIAGNOSIS — F0150 Vascular dementia without behavioral disturbance: Secondary | ICD-10-CM | POA: Diagnosis not present

## 2019-08-27 DIAGNOSIS — E0590 Thyrotoxicosis, unspecified without thyrotoxic crisis or storm: Secondary | ICD-10-CM | POA: Diagnosis not present

## 2019-08-28 DIAGNOSIS — I10 Essential (primary) hypertension: Secondary | ICD-10-CM | POA: Diagnosis not present

## 2019-08-28 DIAGNOSIS — F0390 Unspecified dementia without behavioral disturbance: Secondary | ICD-10-CM | POA: Diagnosis not present

## 2019-08-28 DIAGNOSIS — G309 Alzheimer's disease, unspecified: Secondary | ICD-10-CM | POA: Diagnosis not present

## 2019-08-28 DIAGNOSIS — E785 Hyperlipidemia, unspecified: Secondary | ICD-10-CM | POA: Diagnosis not present

## 2019-08-28 DIAGNOSIS — Z23 Encounter for immunization: Secondary | ICD-10-CM | POA: Diagnosis not present

## 2019-09-01 DIAGNOSIS — E0590 Thyrotoxicosis, unspecified without thyrotoxic crisis or storm: Secondary | ICD-10-CM | POA: Diagnosis not present

## 2019-09-01 DIAGNOSIS — I69318 Other symptoms and signs involving cognitive functions following cerebral infarction: Secondary | ICD-10-CM | POA: Diagnosis not present

## 2019-09-01 DIAGNOSIS — I4891 Unspecified atrial fibrillation: Secondary | ICD-10-CM | POA: Diagnosis not present

## 2019-09-01 DIAGNOSIS — F0150 Vascular dementia without behavioral disturbance: Secondary | ICD-10-CM | POA: Diagnosis not present

## 2019-09-01 DIAGNOSIS — I10 Essential (primary) hypertension: Secondary | ICD-10-CM | POA: Diagnosis not present

## 2019-09-05 DIAGNOSIS — F0150 Vascular dementia without behavioral disturbance: Secondary | ICD-10-CM | POA: Diagnosis not present

## 2019-09-05 DIAGNOSIS — I10 Essential (primary) hypertension: Secondary | ICD-10-CM | POA: Diagnosis not present

## 2019-09-05 DIAGNOSIS — E0590 Thyrotoxicosis, unspecified without thyrotoxic crisis or storm: Secondary | ICD-10-CM | POA: Diagnosis not present

## 2019-09-05 DIAGNOSIS — I4891 Unspecified atrial fibrillation: Secondary | ICD-10-CM | POA: Diagnosis not present

## 2019-09-05 DIAGNOSIS — I69318 Other symptoms and signs involving cognitive functions following cerebral infarction: Secondary | ICD-10-CM | POA: Diagnosis not present

## 2019-09-08 ENCOUNTER — Emergency Department (HOSPITAL_COMMUNITY)
Admission: EM | Admit: 2019-09-08 | Discharge: 2019-09-08 | Disposition: A | Discharge status: Home / Self Care | Attending: Emergency Medicine | Admitting: Emergency Medicine

## 2019-09-08 ENCOUNTER — Emergency Department (HOSPITAL_COMMUNITY)

## 2019-09-08 DIAGNOSIS — E0590 Thyrotoxicosis, unspecified without thyrotoxic crisis or storm: Secondary | ICD-10-CM | POA: Diagnosis not present

## 2019-09-08 DIAGNOSIS — Z79899 Other long term (current) drug therapy: Secondary | ICD-10-CM | POA: Diagnosis not present

## 2019-09-08 DIAGNOSIS — I69318 Other symptoms and signs involving cognitive functions following cerebral infarction: Secondary | ICD-10-CM | POA: Diagnosis not present

## 2019-09-08 DIAGNOSIS — Y9389 Activity, other specified: Secondary | ICD-10-CM | POA: Diagnosis not present

## 2019-09-08 DIAGNOSIS — E042 Nontoxic multinodular goiter: Secondary | ICD-10-CM | POA: Diagnosis not present

## 2019-09-08 DIAGNOSIS — Z7901 Long term (current) use of anticoagulants: Secondary | ICD-10-CM | POA: Diagnosis not present

## 2019-09-08 DIAGNOSIS — I480 Paroxysmal atrial fibrillation: Secondary | ICD-10-CM | POA: Diagnosis not present

## 2019-09-08 DIAGNOSIS — Y92128 Other place in nursing home as the place of occurrence of the external cause: Secondary | ICD-10-CM | POA: Diagnosis not present

## 2019-09-08 DIAGNOSIS — F0390 Unspecified dementia without behavioral disturbance: Secondary | ICD-10-CM | POA: Diagnosis not present

## 2019-09-08 DIAGNOSIS — R5381 Other malaise: Secondary | ICD-10-CM | POA: Diagnosis not present

## 2019-09-08 DIAGNOSIS — I10 Essential (primary) hypertension: Secondary | ICD-10-CM | POA: Diagnosis not present

## 2019-09-08 DIAGNOSIS — W19XXXA Unspecified fall, initial encounter: Secondary | ICD-10-CM | POA: Diagnosis not present

## 2019-09-08 DIAGNOSIS — G9389 Other specified disorders of brain: Secondary | ICD-10-CM | POA: Diagnosis not present

## 2019-09-08 DIAGNOSIS — Y999 Unspecified external cause status: Secondary | ICD-10-CM | POA: Insufficient documentation

## 2019-09-08 DIAGNOSIS — Z743 Need for continuous supervision: Secondary | ICD-10-CM | POA: Diagnosis not present

## 2019-09-08 DIAGNOSIS — I6389 Other cerebral infarction: Secondary | ICD-10-CM | POA: Diagnosis not present

## 2019-09-08 DIAGNOSIS — R279 Unspecified lack of coordination: Secondary | ICD-10-CM | POA: Diagnosis not present

## 2019-09-08 DIAGNOSIS — I6523 Occlusion and stenosis of bilateral carotid arteries: Secondary | ICD-10-CM | POA: Diagnosis not present

## 2019-09-08 DIAGNOSIS — R404 Transient alteration of awareness: Secondary | ICD-10-CM | POA: Diagnosis not present

## 2019-09-08 DIAGNOSIS — R52 Pain, unspecified: Secondary | ICD-10-CM | POA: Diagnosis not present

## 2019-09-08 DIAGNOSIS — F0150 Vascular dementia without behavioral disturbance: Secondary | ICD-10-CM | POA: Diagnosis not present

## 2019-09-08 DIAGNOSIS — M47812 Spondylosis without myelopathy or radiculopathy, cervical region: Secondary | ICD-10-CM | POA: Diagnosis not present

## 2019-09-08 DIAGNOSIS — S0990XA Unspecified injury of head, initial encounter: Secondary | ICD-10-CM | POA: Diagnosis present

## 2019-09-08 DIAGNOSIS — I4891 Unspecified atrial fibrillation: Secondary | ICD-10-CM | POA: Diagnosis not present

## 2019-09-08 NOTE — ED Notes (Signed)
Patient verbalizes understanding of discharge instructions. Opportunity for questioning and answers were provided. Armband removed by staff, pt discharged from ED. Pt. ambulatory and discharged home with PTAR  

## 2019-09-08 NOTE — ED Triage Notes (Signed)
Pt came in [facility] from [facility] post unwitnessed fall. Pt has a hx of dementia and alzheimers and is A/O x1 (self). EMS arrived and patient was on the floor with head tucked under the bed. Pt complains of generalized body pains. C-Collar placed. On Eliquis.

## 2019-09-08 NOTE — ED Provider Notes (Signed)
[facility] EMERGENCY DEPARTMENT Provider Note   CSN: [id number] Arrival date & time: 09/08/19  0126   History Chief Complaint  Patient presents with   Amy Randall is a 86 y.o. female.  The history is provided by the nursing home. The history is limited by the condition of the patient (Dementia).  Fall  She has a history of hypertension, dementia, atrial fibrillation anticoagulated on apixaban and was brought in by ambulance after having an unwitnessed fall at the skilled nursing facility where she resides.  She is reported to be at her baseline mental status, but patient is not able to give any information.  Past Medical History:  Diagnosis Date   Atrial fibrillation (HCC)    Dementia (HCC)    Hypertension     Patient Active Problem List   Diagnosis Date Noted   Stroke (HCC) 08/01/2019   Tachycardia 01/02/2019   Acute CHF (congestive heart failure) (HCC) 01/02/2019   Hip fracture (HCC) 12/14/2018   Essential hypertension 12/14/2018   Dementia without behavioral disturbance (HCC) 12/14/2018   AF (paroxysmal atrial fibrillation) (HCC) 12/14/2018   Hyperglycemia 12/14/2018    Past Surgical History:  Procedure Laterality Date   INTRAMEDULLARY (IM) NAIL INTERTROCHANTERIC Right 12/14/2018   Procedure: INTRAMEDULLARY (IM) NAIL INTERTROCHANTRIC;  Surgeon: Yolonda Kida, MD;  Location: [facility] OR;  Service: Orthopedics;  Laterality: Right;     OB History   No obstetric history on file.     Family History  Problem Relation Age of Onset   Hypertension Father    Stroke Father    Diabetes Maternal Grandmother     Social History   Tobacco Use   Smoking status: Never Smoker   Smokeless tobacco: Never Used  Substance Use Topics   Alcohol use: Not on file   Drug use: Not on file    Home Medications Prior to Admission medications   Medication Sig Start Date End Date Taking? Authorizing Provider  apixaban  (ELIQUIS) 2.5 MG TABS tablet Take 1 tablet (2.5 mg total) by mouth 2 (two) times daily. 08/07/19   Barnetta Chapel, MD  diltiazem (CARDIZEM CD) 360 MG 24 hr capsule Take 1 capsule (360 mg total) by mouth daily. 01/04/19   [name], MD  Ensure (ENSURE) Take 237 mLs by mouth 3 (three) times daily between meals.    [provider]  methimazole (TAPAZOLE) 5 MG tablet Take 5 mg by mouth daily. 06/04/19   [provider]  metoprolol tartrate (LOPRESSOR) 25 MG tablet Take 1 tablet (25 mg total) by mouth 2 (two) times daily. 01/04/19   [name], MD  rivastigmine (EXELON) 13.3 MG/24HR Place 13.3 mg onto the skin daily. 06/10/19   [provider]    Allergies    Bactrim [sulfamethoxazole-trimethoprim], Ibuprofen, Orange fruit [citrus], and Penicillins  Review of Systems   Review of Systems  Unable to perform ROS: Dementia    Physical Exam Updated Vital Signs BP (!) 138/92    Pulse 78    Temp 97.6 F (36.4 C) (Oral)    Resp (!) 24    SpO2 97%   Physical Exam Vitals and nursing note reviewed.   86 year old female, resting comfortably and in no acute distress. Vital signs are normal. Oxygen saturation is 97%, which is normal. Head is normocephalic and atraumatic. PERRLA, EOMI. Oropharynx is clear. Neck is immobilized in a stiff cervical collar and is nontender without adenopathy or JVD. Back is nontender and  there is no CVA tenderness. Lungs are clear without rales, wheezes, or rhonchi. Chest is nontender. Heart has regular rate and rhythm without murmur. Abdomen is soft, flat, nontender without masses or hepatosplenomegaly and peristalsis is normoactive. Extremities have no cyanosis or edema, full range of motion is present.  Status post amputations of the PIP joint left third and fourth fingers, DIP joint left fifth finger. Skin is warm and dry without rash. Neurologic: Awake and oriented to person but not place or time, cranial nerves are grossly  intact, there are no gross motor or sensory deficits.  ED Results / Procedures / Treatments    Radiology CT Head Wo Contrast  Result Date: 09/08/2019 CLINICAL DATA:  Unwitnessed fall, anticoagulation EXAM: CT HEAD WITHOUT CONTRAST CT CERVICAL SPINE WITHOUT CONTRAST TECHNIQUE: Multidetector CT imaging of the head and cervical spine was performed following the standard protocol without intravenous contrast. Multiplanar CT image reconstructions of the cervical spine were also generated. COMPARISON:  MRA head 08/02/2019, MR head 08/01/2019, CT head 08/01/2019 FINDINGS: CT HEAD FINDINGS Brain: Evolving areas of gliosis seen in the right occipital lobe, splenium of the corpus callosum and extending into the right hippocampus corresponding well to areas of diffusion restriction seen on comparison MR. Additional areas of remote gliosis are noted in the right cerebellar hemisphere and bilateral basal ganglia as well as the corona radiata and right thalamus. No convincing evidence of acute infarction, hemorrhage, hydrocephalus, extra-axial collection or mass lesion/mass effect. Symmetric prominence of the ventricles, cisterns and sulci compatible with parenchymal volume loss. Patchy and confluent areas of white matter hypoattenuation are most compatible with advanced chronic microvascular angiopathy. Benign dural calcifications are again noted. Small anterior parafalcine lipoma is present as well. Vascular: Atherosclerotic calcification of the carotid siphons and intradural vertebral arteries. No hyperdense vessel. Skull: No calvarial fracture or suspicious osseous lesion. No scalp swelling or hematoma. Sinuses/Orbits: Paranasal sinuses and mastoid air cells are predominantly clear. Included orbital structures are unremarkable. Other: None CT CERVICAL SPINE FINDINGS Alignment: Cervical stabilization collar is in place. There is focal reversal the normal cervical lordosis centered at C3-4 which is unchanged from prior.  Craniocervical and atlantoaxial articulations are normally aligned. Mild anterolisthesis of C4 on C5 is similar to comparison study and likely degenerative in nature. No abnormally widened, jumped or perched facets. Skull base and vertebrae: No acute fracture. No suspicious osseous lesions. Complete effacement of the C3-4 disc space with some early bony fusion across the anterior vertebral bodies on a degenerative basis. Soft tissues and spinal canal: No pre or paravertebral fluid or swelling. No visible canal hematoma. Disc levels: Multilevel cervical spondylitic changes are present. Minimal disc osteophyte complexes efface the ventral thecal sac without significant central canal stenosis. Uncinate spurring and facet hypertrophic changes are present diffusely throughout the cervical spine resulting in at most mild to moderate bilateral neural foraminal narrowing. No severe canal stenosis or foraminal narrowing. Upper chest: No acute abnormality in the upper chest or imaged lung apices. Other: Markedly enlarged left thyroid lobe with heterogeneity and several possible discrete nodules within the gland. Some punctate calcifications noted as well. There is rightward deviation of the trachea with some coronal narrowing. Atherosclerotic calcifications noted at the carotid bifurcations and internal carotid origins. IMPRESSION: CT HEAD 1. No CT evidence of acute intracranial hemorrhage or other acute intracranial pathology. 2. Evolving areas of gliosis in the right occipital lobe, splenium of the corpus callosum and extending into the right hippocampus corresponding well to areas of diffusion restriction seen on  comparison MR. 3. Additional areas of remote infarct are stable from prior. Background of advanced chronic microvascular angiopathy and parenchymal volume loss. CT CERVICAL SPINE 1. No acute cervical spine fracture or traumatic listhesis. 2. Multilevel cervical spondylitic changes maximal at C3-4 with degenerative  ankylosis of the vertebral bodies. 3. Markedly enlarged heterogeneous thyroid gland particularly involvement of the left lobe which displaces and narrows the trachea. Consider evaluation with thyroid ultrasound if not obtained previously. This follows consensus guidelines: Managing Incidental Thyroid Nodules Detected on Imaging: White Paper of the ACR Incidental Thyroid Findings Committee. J Am Coll Radiol 2015; 12:143-150. and [employer] 3-tiered system for managing ITNs: J Am Coll Radiol. 2015; Feb;12(2): 143-50 4. Intracranial and cervical carotid atherosclerosis. Electronically Signed   By: Kreg Shropshire M.D.   On: 09/08/2019 02:42   CT Cervical Spine Wo Contrast  Result Date: 09/08/2019 CLINICAL DATA:  Unwitnessed fall, anticoagulation EXAM: CT HEAD WITHOUT CONTRAST CT CERVICAL SPINE WITHOUT CONTRAST TECHNIQUE: Multidetector CT imaging of the head and cervical spine was performed following the standard protocol without intravenous contrast. Multiplanar CT image reconstructions of the cervical spine were also generated. COMPARISON:  MRA head 08/02/2019, MR head 08/01/2019, CT head 08/01/2019 FINDINGS: CT HEAD FINDINGS Brain: Evolving areas of gliosis seen in the right occipital lobe, splenium of the corpus callosum and extending into the right hippocampus corresponding well to areas of diffusion restriction seen on comparison MR. Additional areas of remote gliosis are noted in the right cerebellar hemisphere and bilateral basal ganglia as well as the corona radiata and right thalamus. No convincing evidence of acute infarction, hemorrhage, hydrocephalus, extra-axial collection or mass lesion/mass effect. Symmetric prominence of the ventricles, cisterns and sulci compatible with parenchymal volume loss. Patchy and confluent areas of white matter hypoattenuation are most compatible with advanced chronic microvascular angiopathy. Benign dural calcifications are again noted. Small anterior parafalcine lipoma is present  as well. Vascular: Atherosclerotic calcification of the carotid siphons and intradural vertebral arteries. No hyperdense vessel. Skull: No calvarial fracture or suspicious osseous lesion. No scalp swelling or hematoma. Sinuses/Orbits: Paranasal sinuses and mastoid air cells are predominantly clear. Included orbital structures are unremarkable. Other: None CT CERVICAL SPINE FINDINGS Alignment: Cervical stabilization collar is in place. There is focal reversal the normal cervical lordosis centered at C3-4 which is unchanged from prior. Craniocervical and atlantoaxial articulations are normally aligned. Mild anterolisthesis of C4 on C5 is similar to comparison study and likely degenerative in nature. No abnormally widened, jumped or perched facets. Skull base and vertebrae: No acute fracture. No suspicious osseous lesions. Complete effacement of the C3-4 disc space with some early bony fusion across the anterior vertebral bodies on a degenerative basis. Soft tissues and spinal canal: No pre or paravertebral fluid or swelling. No visible canal hematoma. Disc levels: Multilevel cervical spondylitic changes are present. Minimal disc osteophyte complexes efface the ventral thecal sac without significant central canal stenosis. Uncinate spurring and facet hypertrophic changes are present diffusely throughout the cervical spine resulting in at most mild to moderate bilateral neural foraminal narrowing. No severe canal stenosis or foraminal narrowing. Upper chest: No acute abnormality in the upper chest or imaged lung apices. Other: Markedly enlarged left thyroid lobe with heterogeneity and several possible discrete nodules within the gland. Some punctate calcifications noted as well. There is rightward deviation of the trachea with some coronal narrowing. Atherosclerotic calcifications noted at the carotid bifurcations and internal carotid origins. IMPRESSION: CT HEAD 1. No CT evidence of acute intracranial hemorrhage or  other acute intracranial  pathology. 2. Evolving areas of gliosis in the right occipital lobe, splenium of the corpus callosum and extending into the right hippocampus corresponding well to areas of diffusion restriction seen on comparison MR. 3. Additional areas of remote infarct are stable from prior. Background of advanced chronic microvascular angiopathy and parenchymal volume loss. CT CERVICAL SPINE 1. No acute cervical spine fracture or traumatic listhesis. 2. Multilevel cervical spondylitic changes maximal at C3-4 with degenerative ankylosis of the vertebral bodies. 3. Markedly enlarged heterogeneous thyroid gland particularly involvement of the left lobe which displaces and narrows the trachea. Consider evaluation with thyroid ultrasound if not obtained previously. This follows consensus guidelines: Managing Incidental Thyroid Nodules Detected on Imaging: White Paper of the ACR Incidental Thyroid Findings Committee. J Am Coll Radiol 2015; 12:143-150. and [employer] 3-tiered system for managing ITNs: J Am Coll Radiol. 2015; Feb;12(2): 143-50 4. Intracranial and cervical carotid atherosclerosis. Electronically Signed   By: Lovena Le M.D.   On: 09/08/2019 02:42    Procedures Procedures  Medications Ordered in ED Medications - No data to display  ED Course  I have reviewed the triage vital signs and the nursing notes.  Pertinent imaging results that were available during my care of the patient were reviewed by me and considered in my medical decision making (see chart for details).  MDM Rules/Calculators/A&P Unwitnessed fall without any obvious injury.  However, since the patient is anticoagulated, will need to send for CT of head to rule out occult bleeding.  Old records are reviewed, and she was admitted to the hospital 1 month ago with a stroke which was discovered after having a fall at the nursing home.  CT scans show evolution of stroke, but no evidence of acute injury.  She is discharged to  return to her skilled nursing facility.  However, if she continues to have falls on a frequent basis, may need to consider the risks of anticoagulation compared with the benefit.  Final Clinical Impression(s) / ED Diagnoses Final diagnoses:  Fall at nursing home, initial encounter  Chronic anticoagulation    Rx / DC Orders ED Discharge Orders    None       Delora Fuel, MD 24/09/73 [phone]

## 2019-09-08 NOTE — ED Notes (Signed)
Attempted to call report to [facility].

## 2019-09-10 DIAGNOSIS — F0390 Unspecified dementia without behavioral disturbance: Secondary | ICD-10-CM | POA: Diagnosis not present

## 2019-09-10 DIAGNOSIS — G309 Alzheimer's disease, unspecified: Secondary | ICD-10-CM | POA: Diagnosis not present

## 2019-09-10 DIAGNOSIS — W1830XD Fall on same level, unspecified, subsequent encounter: Secondary | ICD-10-CM | POA: Diagnosis not present

## 2019-09-10 DIAGNOSIS — R627 Adult failure to thrive: Secondary | ICD-10-CM | POA: Diagnosis not present

## 2019-09-12 DIAGNOSIS — I10 Essential (primary) hypertension: Secondary | ICD-10-CM | POA: Diagnosis not present

## 2019-09-12 DIAGNOSIS — E0590 Thyrotoxicosis, unspecified without thyrotoxic crisis or storm: Secondary | ICD-10-CM | POA: Diagnosis not present

## 2019-09-12 DIAGNOSIS — F0150 Vascular dementia without behavioral disturbance: Secondary | ICD-10-CM | POA: Diagnosis not present

## 2019-09-12 DIAGNOSIS — I69318 Other symptoms and signs involving cognitive functions following cerebral infarction: Secondary | ICD-10-CM | POA: Diagnosis not present

## 2019-09-12 DIAGNOSIS — I4891 Unspecified atrial fibrillation: Secondary | ICD-10-CM | POA: Diagnosis not present

## 2019-09-15 DIAGNOSIS — I10 Essential (primary) hypertension: Secondary | ICD-10-CM | POA: Diagnosis not present

## 2019-09-15 DIAGNOSIS — E0590 Thyrotoxicosis, unspecified without thyrotoxic crisis or storm: Secondary | ICD-10-CM | POA: Diagnosis not present

## 2019-09-15 DIAGNOSIS — I69318 Other symptoms and signs involving cognitive functions following cerebral infarction: Secondary | ICD-10-CM | POA: Diagnosis not present

## 2019-09-15 DIAGNOSIS — F0150 Vascular dementia without behavioral disturbance: Secondary | ICD-10-CM | POA: Diagnosis not present

## 2019-09-15 DIAGNOSIS — I4891 Unspecified atrial fibrillation: Secondary | ICD-10-CM | POA: Diagnosis not present

## 2019-09-16 ENCOUNTER — Telehealth: Payer: Self-pay | Admitting: Adult Health

## 2019-09-16 DIAGNOSIS — I69318 Other symptoms and signs involving cognitive functions following cerebral infarction: Secondary | ICD-10-CM | POA: Diagnosis not present

## 2019-09-16 DIAGNOSIS — E0590 Thyrotoxicosis, unspecified without thyrotoxic crisis or storm: Secondary | ICD-10-CM | POA: Diagnosis not present

## 2019-09-16 DIAGNOSIS — F0150 Vascular dementia without behavioral disturbance: Secondary | ICD-10-CM | POA: Diagnosis not present

## 2019-09-16 DIAGNOSIS — I4891 Unspecified atrial fibrillation: Secondary | ICD-10-CM | POA: Diagnosis not present

## 2019-09-16 DIAGNOSIS — I10 Essential (primary) hypertension: Secondary | ICD-10-CM | POA: Diagnosis not present

## 2019-09-16 NOTE — Telephone Encounter (Signed)
Thank you for the update!

## 2019-09-16 NOTE — Telephone Encounter (Signed)
FYI  I called patient regarding confirming 2/11 appointment. I spoke with patient's daughter who states patient is in a facility that just got over an outbreak of Covid. She states that all residents were vaccinated recently and are set to receive the second dose on 2/18. She requests that we move the appointment out a little bit to allow enough time for the vaccination to take effect and for her to receive an antibody test from the facility thereafter. I rescheduled patients appointment for March 18th.

## 2019-09-17 DIAGNOSIS — F0150 Vascular dementia without behavioral disturbance: Secondary | ICD-10-CM | POA: Diagnosis not present

## 2019-09-17 DIAGNOSIS — E0590 Thyrotoxicosis, unspecified without thyrotoxic crisis or storm: Secondary | ICD-10-CM | POA: Diagnosis not present

## 2019-09-17 DIAGNOSIS — I69318 Other symptoms and signs involving cognitive functions following cerebral infarction: Secondary | ICD-10-CM | POA: Diagnosis not present

## 2019-09-17 DIAGNOSIS — I10 Essential (primary) hypertension: Secondary | ICD-10-CM | POA: Diagnosis not present

## 2019-09-17 DIAGNOSIS — I4891 Unspecified atrial fibrillation: Secondary | ICD-10-CM | POA: Diagnosis not present

## 2019-09-18 ENCOUNTER — Inpatient Hospital Stay: Payer: Medicare Other | Admitting: Adult Health

## 2019-09-18 DIAGNOSIS — I69318 Other symptoms and signs involving cognitive functions following cerebral infarction: Secondary | ICD-10-CM | POA: Diagnosis not present

## 2019-09-18 DIAGNOSIS — E0590 Thyrotoxicosis, unspecified without thyrotoxic crisis or storm: Secondary | ICD-10-CM | POA: Diagnosis not present

## 2019-09-18 DIAGNOSIS — F0150 Vascular dementia without behavioral disturbance: Secondary | ICD-10-CM | POA: Diagnosis not present

## 2019-09-18 DIAGNOSIS — I10 Essential (primary) hypertension: Secondary | ICD-10-CM | POA: Diagnosis not present

## 2019-09-18 DIAGNOSIS — I4891 Unspecified atrial fibrillation: Secondary | ICD-10-CM | POA: Diagnosis not present

## 2019-09-22 DIAGNOSIS — E0590 Thyrotoxicosis, unspecified without thyrotoxic crisis or storm: Secondary | ICD-10-CM | POA: Diagnosis not present

## 2019-09-22 DIAGNOSIS — F0150 Vascular dementia without behavioral disturbance: Secondary | ICD-10-CM | POA: Diagnosis not present

## 2019-09-22 DIAGNOSIS — I10 Essential (primary) hypertension: Secondary | ICD-10-CM | POA: Diagnosis not present

## 2019-09-22 DIAGNOSIS — I69318 Other symptoms and signs involving cognitive functions following cerebral infarction: Secondary | ICD-10-CM | POA: Diagnosis not present

## 2019-09-22 DIAGNOSIS — I4891 Unspecified atrial fibrillation: Secondary | ICD-10-CM | POA: Diagnosis not present

## 2019-09-24 DIAGNOSIS — I69318 Other symptoms and signs involving cognitive functions following cerebral infarction: Secondary | ICD-10-CM | POA: Diagnosis not present

## 2019-09-24 DIAGNOSIS — F0150 Vascular dementia without behavioral disturbance: Secondary | ICD-10-CM | POA: Diagnosis not present

## 2019-09-24 DIAGNOSIS — I10 Essential (primary) hypertension: Secondary | ICD-10-CM | POA: Diagnosis not present

## 2019-09-24 DIAGNOSIS — I4891 Unspecified atrial fibrillation: Secondary | ICD-10-CM | POA: Diagnosis not present

## 2019-09-24 DIAGNOSIS — E0590 Thyrotoxicosis, unspecified without thyrotoxic crisis or storm: Secondary | ICD-10-CM | POA: Diagnosis not present

## 2019-09-25 DIAGNOSIS — Z23 Encounter for immunization: Secondary | ICD-10-CM | POA: Diagnosis not present

## 2019-09-29 DIAGNOSIS — F0150 Vascular dementia without behavioral disturbance: Secondary | ICD-10-CM | POA: Diagnosis not present

## 2019-09-29 DIAGNOSIS — I69318 Other symptoms and signs involving cognitive functions following cerebral infarction: Secondary | ICD-10-CM | POA: Diagnosis not present

## 2019-09-29 DIAGNOSIS — I10 Essential (primary) hypertension: Secondary | ICD-10-CM | POA: Diagnosis not present

## 2019-09-29 DIAGNOSIS — I4891 Unspecified atrial fibrillation: Secondary | ICD-10-CM | POA: Diagnosis not present

## 2019-09-29 DIAGNOSIS — E0590 Thyrotoxicosis, unspecified without thyrotoxic crisis or storm: Secondary | ICD-10-CM | POA: Diagnosis not present

## 2019-10-01 DIAGNOSIS — I10 Essential (primary) hypertension: Secondary | ICD-10-CM | POA: Diagnosis not present

## 2019-10-01 DIAGNOSIS — I69318 Other symptoms and signs involving cognitive functions following cerebral infarction: Secondary | ICD-10-CM | POA: Diagnosis not present

## 2019-10-01 DIAGNOSIS — I4891 Unspecified atrial fibrillation: Secondary | ICD-10-CM | POA: Diagnosis not present

## 2019-10-01 DIAGNOSIS — E0590 Thyrotoxicosis, unspecified without thyrotoxic crisis or storm: Secondary | ICD-10-CM | POA: Diagnosis not present

## 2019-10-01 DIAGNOSIS — F0150 Vascular dementia without behavioral disturbance: Secondary | ICD-10-CM | POA: Diagnosis not present

## 2019-10-06 DIAGNOSIS — I10 Essential (primary) hypertension: Secondary | ICD-10-CM | POA: Diagnosis not present

## 2019-10-06 DIAGNOSIS — I69318 Other symptoms and signs involving cognitive functions following cerebral infarction: Secondary | ICD-10-CM | POA: Diagnosis not present

## 2019-10-06 DIAGNOSIS — F0150 Vascular dementia without behavioral disturbance: Secondary | ICD-10-CM | POA: Diagnosis not present

## 2019-10-06 DIAGNOSIS — I4891 Unspecified atrial fibrillation: Secondary | ICD-10-CM | POA: Diagnosis not present

## 2019-10-06 DIAGNOSIS — E0590 Thyrotoxicosis, unspecified without thyrotoxic crisis or storm: Secondary | ICD-10-CM | POA: Diagnosis not present

## 2019-10-09 DIAGNOSIS — E0590 Thyrotoxicosis, unspecified without thyrotoxic crisis or storm: Secondary | ICD-10-CM | POA: Diagnosis not present

## 2019-10-09 DIAGNOSIS — F0150 Vascular dementia without behavioral disturbance: Secondary | ICD-10-CM | POA: Diagnosis not present

## 2019-10-09 DIAGNOSIS — I4891 Unspecified atrial fibrillation: Secondary | ICD-10-CM | POA: Diagnosis not present

## 2019-10-09 DIAGNOSIS — I69318 Other symptoms and signs involving cognitive functions following cerebral infarction: Secondary | ICD-10-CM | POA: Diagnosis not present

## 2019-10-09 DIAGNOSIS — I10 Essential (primary) hypertension: Secondary | ICD-10-CM | POA: Diagnosis not present

## 2019-10-10 DIAGNOSIS — F0150 Vascular dementia without behavioral disturbance: Secondary | ICD-10-CM | POA: Diagnosis not present

## 2019-10-10 DIAGNOSIS — I10 Essential (primary) hypertension: Secondary | ICD-10-CM | POA: Diagnosis not present

## 2019-10-10 DIAGNOSIS — I69318 Other symptoms and signs involving cognitive functions following cerebral infarction: Secondary | ICD-10-CM | POA: Diagnosis not present

## 2019-10-10 DIAGNOSIS — E0590 Thyrotoxicosis, unspecified without thyrotoxic crisis or storm: Secondary | ICD-10-CM | POA: Diagnosis not present

## 2019-10-10 DIAGNOSIS — I4891 Unspecified atrial fibrillation: Secondary | ICD-10-CM | POA: Diagnosis not present

## 2019-10-11 DIAGNOSIS — I69318 Other symptoms and signs involving cognitive functions following cerebral infarction: Secondary | ICD-10-CM | POA: Diagnosis not present

## 2019-10-11 DIAGNOSIS — E0590 Thyrotoxicosis, unspecified without thyrotoxic crisis or storm: Secondary | ICD-10-CM | POA: Diagnosis not present

## 2019-10-11 DIAGNOSIS — I10 Essential (primary) hypertension: Secondary | ICD-10-CM | POA: Diagnosis not present

## 2019-10-11 DIAGNOSIS — I4891 Unspecified atrial fibrillation: Secondary | ICD-10-CM | POA: Diagnosis not present

## 2019-10-11 DIAGNOSIS — F0150 Vascular dementia without behavioral disturbance: Secondary | ICD-10-CM | POA: Diagnosis not present

## 2019-10-13 DIAGNOSIS — E0590 Thyrotoxicosis, unspecified without thyrotoxic crisis or storm: Secondary | ICD-10-CM | POA: Diagnosis not present

## 2019-10-13 DIAGNOSIS — I10 Essential (primary) hypertension: Secondary | ICD-10-CM | POA: Diagnosis not present

## 2019-10-13 DIAGNOSIS — I4891 Unspecified atrial fibrillation: Secondary | ICD-10-CM | POA: Diagnosis not present

## 2019-10-13 DIAGNOSIS — I69318 Other symptoms and signs involving cognitive functions following cerebral infarction: Secondary | ICD-10-CM | POA: Diagnosis not present

## 2019-10-13 DIAGNOSIS — F0150 Vascular dementia without behavioral disturbance: Secondary | ICD-10-CM | POA: Diagnosis not present

## 2019-10-15 DIAGNOSIS — F0150 Vascular dementia without behavioral disturbance: Secondary | ICD-10-CM | POA: Diagnosis not present

## 2019-10-15 DIAGNOSIS — I69318 Other symptoms and signs involving cognitive functions following cerebral infarction: Secondary | ICD-10-CM | POA: Diagnosis not present

## 2019-10-15 DIAGNOSIS — I10 Essential (primary) hypertension: Secondary | ICD-10-CM | POA: Diagnosis not present

## 2019-10-15 DIAGNOSIS — I4891 Unspecified atrial fibrillation: Secondary | ICD-10-CM | POA: Diagnosis not present

## 2019-10-15 DIAGNOSIS — E0590 Thyrotoxicosis, unspecified without thyrotoxic crisis or storm: Secondary | ICD-10-CM | POA: Diagnosis not present

## 2019-10-20 DIAGNOSIS — I10 Essential (primary) hypertension: Secondary | ICD-10-CM | POA: Diagnosis not present

## 2019-10-20 DIAGNOSIS — I69318 Other symptoms and signs involving cognitive functions following cerebral infarction: Secondary | ICD-10-CM | POA: Diagnosis not present

## 2019-10-20 DIAGNOSIS — E0590 Thyrotoxicosis, unspecified without thyrotoxic crisis or storm: Secondary | ICD-10-CM | POA: Diagnosis not present

## 2019-10-20 DIAGNOSIS — F0150 Vascular dementia without behavioral disturbance: Secondary | ICD-10-CM | POA: Diagnosis not present

## 2019-10-20 DIAGNOSIS — I4891 Unspecified atrial fibrillation: Secondary | ICD-10-CM | POA: Diagnosis not present

## 2019-10-21 DIAGNOSIS — I69318 Other symptoms and signs involving cognitive functions following cerebral infarction: Secondary | ICD-10-CM | POA: Diagnosis not present

## 2019-10-21 DIAGNOSIS — I4891 Unspecified atrial fibrillation: Secondary | ICD-10-CM | POA: Diagnosis not present

## 2019-10-21 DIAGNOSIS — F0150 Vascular dementia without behavioral disturbance: Secondary | ICD-10-CM | POA: Diagnosis not present

## 2019-10-21 DIAGNOSIS — I10 Essential (primary) hypertension: Secondary | ICD-10-CM | POA: Diagnosis not present

## 2019-10-21 DIAGNOSIS — E0590 Thyrotoxicosis, unspecified without thyrotoxic crisis or storm: Secondary | ICD-10-CM | POA: Diagnosis not present

## 2019-10-22 DIAGNOSIS — F0150 Vascular dementia without behavioral disturbance: Secondary | ICD-10-CM | POA: Diagnosis not present

## 2019-10-22 DIAGNOSIS — I4891 Unspecified atrial fibrillation: Secondary | ICD-10-CM | POA: Diagnosis not present

## 2019-10-22 DIAGNOSIS — E0590 Thyrotoxicosis, unspecified without thyrotoxic crisis or storm: Secondary | ICD-10-CM | POA: Diagnosis not present

## 2019-10-22 DIAGNOSIS — I69318 Other symptoms and signs involving cognitive functions following cerebral infarction: Secondary | ICD-10-CM | POA: Diagnosis not present

## 2019-10-22 DIAGNOSIS — I10 Essential (primary) hypertension: Secondary | ICD-10-CM | POA: Diagnosis not present

## 2019-10-23 ENCOUNTER — Inpatient Hospital Stay: Payer: Medicare Other | Admitting: Adult Health

## 2019-10-27 DIAGNOSIS — I10 Essential (primary) hypertension: Secondary | ICD-10-CM | POA: Diagnosis not present

## 2019-10-27 DIAGNOSIS — F0150 Vascular dementia without behavioral disturbance: Secondary | ICD-10-CM | POA: Diagnosis not present

## 2019-10-27 DIAGNOSIS — I4891 Unspecified atrial fibrillation: Secondary | ICD-10-CM | POA: Diagnosis not present

## 2019-10-27 DIAGNOSIS — E0590 Thyrotoxicosis, unspecified without thyrotoxic crisis or storm: Secondary | ICD-10-CM | POA: Diagnosis not present

## 2019-10-27 DIAGNOSIS — I69318 Other symptoms and signs involving cognitive functions following cerebral infarction: Secondary | ICD-10-CM | POA: Diagnosis not present

## 2019-10-29 DIAGNOSIS — F0150 Vascular dementia without behavioral disturbance: Secondary | ICD-10-CM | POA: Diagnosis not present

## 2019-10-29 DIAGNOSIS — I69318 Other symptoms and signs involving cognitive functions following cerebral infarction: Secondary | ICD-10-CM | POA: Diagnosis not present

## 2019-10-29 DIAGNOSIS — I10 Essential (primary) hypertension: Secondary | ICD-10-CM | POA: Diagnosis not present

## 2019-10-29 DIAGNOSIS — I4891 Unspecified atrial fibrillation: Secondary | ICD-10-CM | POA: Diagnosis not present

## 2019-10-29 DIAGNOSIS — E0590 Thyrotoxicosis, unspecified without thyrotoxic crisis or storm: Secondary | ICD-10-CM | POA: Diagnosis not present

## 2019-10-30 DIAGNOSIS — E0590 Thyrotoxicosis, unspecified without thyrotoxic crisis or storm: Secondary | ICD-10-CM | POA: Diagnosis not present

## 2019-10-30 DIAGNOSIS — I10 Essential (primary) hypertension: Secondary | ICD-10-CM | POA: Diagnosis not present

## 2019-10-30 DIAGNOSIS — I4891 Unspecified atrial fibrillation: Secondary | ICD-10-CM | POA: Diagnosis not present

## 2019-10-30 DIAGNOSIS — I69318 Other symptoms and signs involving cognitive functions following cerebral infarction: Secondary | ICD-10-CM | POA: Diagnosis not present

## 2019-10-30 DIAGNOSIS — F0150 Vascular dementia without behavioral disturbance: Secondary | ICD-10-CM | POA: Diagnosis not present

## 2019-10-31 DIAGNOSIS — E0590 Thyrotoxicosis, unspecified without thyrotoxic crisis or storm: Secondary | ICD-10-CM | POA: Diagnosis not present

## 2019-10-31 DIAGNOSIS — I10 Essential (primary) hypertension: Secondary | ICD-10-CM | POA: Diagnosis not present

## 2019-10-31 DIAGNOSIS — F0150 Vascular dementia without behavioral disturbance: Secondary | ICD-10-CM | POA: Diagnosis not present

## 2019-10-31 DIAGNOSIS — I4891 Unspecified atrial fibrillation: Secondary | ICD-10-CM | POA: Diagnosis not present

## 2019-10-31 DIAGNOSIS — I69318 Other symptoms and signs involving cognitive functions following cerebral infarction: Secondary | ICD-10-CM | POA: Diagnosis not present

## 2019-11-03 DIAGNOSIS — E0590 Thyrotoxicosis, unspecified without thyrotoxic crisis or storm: Secondary | ICD-10-CM | POA: Diagnosis not present

## 2019-11-03 DIAGNOSIS — I10 Essential (primary) hypertension: Secondary | ICD-10-CM | POA: Diagnosis not present

## 2019-11-03 DIAGNOSIS — F0150 Vascular dementia without behavioral disturbance: Secondary | ICD-10-CM | POA: Diagnosis not present

## 2019-11-03 DIAGNOSIS — I69318 Other symptoms and signs involving cognitive functions following cerebral infarction: Secondary | ICD-10-CM | POA: Diagnosis not present

## 2019-11-03 DIAGNOSIS — I4891 Unspecified atrial fibrillation: Secondary | ICD-10-CM | POA: Diagnosis not present

## 2019-11-05 DIAGNOSIS — E0590 Thyrotoxicosis, unspecified without thyrotoxic crisis or storm: Secondary | ICD-10-CM | POA: Diagnosis not present

## 2019-11-05 DIAGNOSIS — I10 Essential (primary) hypertension: Secondary | ICD-10-CM | POA: Diagnosis not present

## 2019-11-05 DIAGNOSIS — I69318 Other symptoms and signs involving cognitive functions following cerebral infarction: Secondary | ICD-10-CM | POA: Diagnosis not present

## 2019-11-05 DIAGNOSIS — I4891 Unspecified atrial fibrillation: Secondary | ICD-10-CM | POA: Diagnosis not present

## 2019-11-05 DIAGNOSIS — F0150 Vascular dementia without behavioral disturbance: Secondary | ICD-10-CM | POA: Diagnosis not present

## 2019-11-25 ENCOUNTER — Inpatient Hospital Stay: Payer: BLUE CROSS/BLUE SHIELD | Admitting: Adult Health

## 2019-12-06 ENCOUNTER — Inpatient Hospital Stay (HOSPITAL_COMMUNITY)
Admission: EM | Admit: 2019-12-06 | Discharge: 2019-12-10 | DRG: 177 | Disposition: A | Discharge status: Skilled Nursing Facility | Source: Skilled Nursing Facility | Attending: Internal Medicine | Admitting: Internal Medicine

## 2019-12-06 ENCOUNTER — Other Ambulatory Visit: Payer: Self-pay

## 2019-12-06 ENCOUNTER — Emergency Department (HOSPITAL_COMMUNITY)

## 2019-12-06 ENCOUNTER — Encounter (HOSPITAL_COMMUNITY): Payer: Self-pay

## 2019-12-06 DIAGNOSIS — R64 Cachexia: Secondary | ICD-10-CM | POA: Diagnosis present

## 2019-12-06 DIAGNOSIS — Z681 Body mass index (BMI) 19 or less, adult: Secondary | ICD-10-CM

## 2019-12-06 DIAGNOSIS — M4854XA Collapsed vertebra, not elsewhere classified, thoracic region, initial encounter for fracture: Secondary | ICD-10-CM | POA: Diagnosis present

## 2019-12-06 DIAGNOSIS — N179 Acute kidney failure, unspecified: Secondary | ICD-10-CM | POA: Diagnosis present

## 2019-12-06 DIAGNOSIS — F0390 Unspecified dementia without behavioral disturbance: Secondary | ICD-10-CM

## 2019-12-06 DIAGNOSIS — Z7901 Long term (current) use of anticoagulants: Secondary | ICD-10-CM

## 2019-12-06 DIAGNOSIS — F03C0 Unspecified dementia, severe, without behavioral disturbance, psychotic disturbance, mood disturbance, and anxiety: Secondary | ICD-10-CM

## 2019-12-06 DIAGNOSIS — J9601 Acute respiratory failure with hypoxia: Secondary | ICD-10-CM | POA: Diagnosis present

## 2019-12-06 DIAGNOSIS — D638 Anemia in other chronic diseases classified elsewhere: Secondary | ICD-10-CM | POA: Diagnosis present

## 2019-12-06 DIAGNOSIS — I10 Essential (primary) hypertension: Secondary | ICD-10-CM | POA: Diagnosis present

## 2019-12-06 DIAGNOSIS — T361X5A Adverse effect of cephalosporins and other beta-lactam antibiotics, initial encounter: Secondary | ICD-10-CM | POA: Diagnosis not present

## 2019-12-06 DIAGNOSIS — R0902 Hypoxemia: Secondary | ICD-10-CM

## 2019-12-06 DIAGNOSIS — Z8673 Personal history of transient ischemic attack (TIA), and cerebral infarction without residual deficits: Secondary | ICD-10-CM

## 2019-12-06 DIAGNOSIS — F0280 Dementia in other diseases classified elsewhere without behavioral disturbance: Secondary | ICD-10-CM | POA: Diagnosis present

## 2019-12-06 DIAGNOSIS — R0682 Tachypnea, not elsewhere classified: Secondary | ICD-10-CM | POA: Diagnosis present

## 2019-12-06 DIAGNOSIS — Z66 Do not resuscitate: Secondary | ICD-10-CM | POA: Diagnosis present

## 2019-12-06 DIAGNOSIS — E0520 Thyrotoxicosis with toxic multinodular goiter without thyrotoxic crisis or storm: Secondary | ICD-10-CM | POA: Diagnosis present

## 2019-12-06 DIAGNOSIS — Z880 Allergy status to penicillin: Secondary | ICD-10-CM

## 2019-12-06 DIAGNOSIS — Z886 Allergy status to analgesic agent status: Secondary | ICD-10-CM | POA: Diagnosis not present

## 2019-12-06 DIAGNOSIS — I4821 Permanent atrial fibrillation: Secondary | ICD-10-CM | POA: Diagnosis present

## 2019-12-06 DIAGNOSIS — Z91018 Allergy to other foods: Secondary | ICD-10-CM

## 2019-12-06 DIAGNOSIS — F419 Anxiety disorder, unspecified: Secondary | ICD-10-CM | POA: Diagnosis not present

## 2019-12-06 DIAGNOSIS — Z515 Encounter for palliative care: Secondary | ICD-10-CM | POA: Diagnosis not present

## 2019-12-06 DIAGNOSIS — R531 Weakness: Secondary | ICD-10-CM | POA: Diagnosis not present

## 2019-12-06 DIAGNOSIS — Z881 Allergy status to other antibiotic agents status: Secondary | ICD-10-CM | POA: Diagnosis not present

## 2019-12-06 DIAGNOSIS — R21 Rash and other nonspecific skin eruption: Secondary | ICD-10-CM | POA: Diagnosis not present

## 2019-12-06 DIAGNOSIS — Z20822 Contact with and (suspected) exposure to covid-19: Secondary | ICD-10-CM | POA: Diagnosis present

## 2019-12-06 DIAGNOSIS — R54 Age-related physical debility: Secondary | ICD-10-CM | POA: Diagnosis present

## 2019-12-06 DIAGNOSIS — Y95 Nosocomial condition: Secondary | ICD-10-CM | POA: Diagnosis present

## 2019-12-06 DIAGNOSIS — J189 Pneumonia, unspecified organism: Secondary | ICD-10-CM | POA: Diagnosis not present

## 2019-12-06 DIAGNOSIS — J690 Pneumonitis due to inhalation of food and vomit: Secondary | ICD-10-CM | POA: Diagnosis present

## 2019-12-06 DIAGNOSIS — Z993 Dependence on wheelchair: Secondary | ICD-10-CM

## 2019-12-06 DIAGNOSIS — Z833 Family history of diabetes mellitus: Secondary | ICD-10-CM

## 2019-12-06 DIAGNOSIS — Z823 Family history of stroke: Secondary | ICD-10-CM

## 2019-12-06 DIAGNOSIS — E0590 Thyrotoxicosis, unspecified without thyrotoxic crisis or storm: Secondary | ICD-10-CM

## 2019-12-06 DIAGNOSIS — J188 Other pneumonia, unspecified organism: Secondary | ICD-10-CM

## 2019-12-06 DIAGNOSIS — R451 Restlessness and agitation: Secondary | ICD-10-CM | POA: Diagnosis not present

## 2019-12-06 DIAGNOSIS — G309 Alzheimer's disease, unspecified: Secondary | ICD-10-CM | POA: Diagnosis present

## 2019-12-06 DIAGNOSIS — Z8249 Family history of ischemic heart disease and other diseases of the circulatory system: Secondary | ICD-10-CM

## 2019-12-06 DIAGNOSIS — Z7189 Other specified counseling: Secondary | ICD-10-CM | POA: Diagnosis not present

## 2019-12-06 LAB — BASIC METABOLIC PANEL
Anion gap: 9 (ref 5–15)
BUN: 31 mg/dL — ABNORMAL HIGH (ref 8–23)
CO2: 24 mmol/L (ref 22–32)
Calcium: 8.4 mg/dL — ABNORMAL LOW (ref 8.9–10.3)
Chloride: 107 mmol/L (ref 98–111)
Creatinine, Ser: 0.9 mg/dL (ref 0.44–1.00)
GFR calc Af Amer: >60 mL/min (ref >60)
GFR calc non Af Amer: 59 mL/min — ABNORMAL LOW (ref >60)
Glucose, Bld: 125 mg/dL — ABNORMAL HIGH (ref 70–99)
Potassium: 4.2 mmol/L (ref 3.5–5.1)
Sodium: 140 mmol/L (ref 135–145)

## 2019-12-06 LAB — RETICULOCYTES
Immature Retic Fract: 18 % — ABNORMAL HIGH (ref 2.3–15.9)
RBC.: 3.69 MIL/uL — ABNORMAL LOW (ref 3.87–5.11)
Retic Count, Absolute: 74.5 10*3/uL (ref 19.0–186.0)
Retic Ct Pct: 2 % (ref 0.4–3.1)

## 2019-12-06 LAB — CBC WITH DIFFERENTIAL/PLATELET
Abs Immature Granulocytes: 0.08 10*3/uL — ABNORMAL HIGH (ref 0.00–0.07)
Basophils Absolute: 0 10*3/uL (ref 0.0–0.1)
Basophils Relative: 0 %
Eosinophils Absolute: 0 10*3/uL (ref 0.0–0.5)
Eosinophils Relative: 0 %
HCT: 34.4 % — ABNORMAL LOW (ref 36.0–46.0)
Hemoglobin: 10.5 g/dL — ABNORMAL LOW (ref 12.0–15.0)
Immature Granulocytes: 1 %
Lymphocytes Relative: 5 %
Lymphs Abs: 0.6 10*3/uL — ABNORMAL LOW (ref 0.7–4.0)
MCH: 28.8 pg (ref 26.0–34.0)
MCHC: 30.5 g/dL (ref 30.0–36.0)
MCV: 94.5 fL (ref 80.0–100.0)
Monocytes Absolute: 1 10*3/uL (ref 0.1–1.0)
Monocytes Relative: 8 %
Neutro Abs: 10.8 10*3/uL — ABNORMAL HIGH (ref 1.7–7.7)
Neutrophils Relative %: 86 %
Platelets: 154 10*3/uL (ref 150–400)
RBC: 3.64 MIL/uL — ABNORMAL LOW (ref 3.87–5.11)
RDW: 16.4 % — ABNORMAL HIGH (ref 11.5–15.5)
WBC: 12.5 10*3/uL — ABNORMAL HIGH (ref 4.0–10.5)
nRBC: 0 % (ref 0.0–0.2)

## 2019-12-06 LAB — FOLATE: Folate: 12 ng/mL (ref >5.9)

## 2019-12-06 LAB — BRAIN NATRIURETIC PEPTIDE: B Natriuretic Peptide: 996.4 pg/mL — ABNORMAL HIGH (ref 0.0–100.0)

## 2019-12-06 LAB — BLOOD GAS, ARTERIAL
Acid-Base Excess: 2.6 mmol/L — ABNORMAL HIGH (ref 0.0–2.0)
Bicarbonate: 25 mmol/L (ref 20.0–28.0)
O2 Saturation: 88.8 %
Patient temperature: 98.7
pCO2 arterial: 32 mmHg (ref 32.0–48.0)
pH, Arterial: 7.506 — ABNORMAL HIGH (ref 7.350–7.450)
pO2, Arterial: 54.6 mmHg — ABNORMAL LOW (ref 83.0–108.0)

## 2019-12-06 LAB — IRON AND TIBC
Iron: 21 ug/dL — ABNORMAL LOW (ref 28–170)
Saturation Ratios: 7 % — ABNORMAL LOW (ref 10.4–31.8)
TIBC: 318 ug/dL (ref 250–450)
UIBC: 297 ug/dL

## 2019-12-06 LAB — RESPIRATORY PANEL BY RT PCR (FLU A&B, COVID)
Influenza A by PCR: NEGATIVE
Influenza B by PCR: NEGATIVE
SARS Coronavirus 2 by RT PCR: NEGATIVE

## 2019-12-06 LAB — TROPONIN I (HIGH SENSITIVITY): Troponin I (High Sensitivity): 46 ng/L — ABNORMAL HIGH (ref <18)

## 2019-12-06 LAB — TSH: TSH: 0.698 u[IU]/mL (ref 0.350–4.500)

## 2019-12-06 LAB — VITAMIN B12: Vitamin B-12: 374 pg/mL (ref 180–914)

## 2019-12-06 LAB — MAGNESIUM: Magnesium: 2 mg/dL (ref 1.7–2.4)

## 2019-12-06 LAB — FERRITIN: Ferritin: 110 ng/mL (ref 11–307)

## 2019-12-06 LAB — PHOSPHORUS: Phosphorus: 3.9 mg/dL (ref 2.5–4.6)

## 2019-12-06 MED ORDER — SODIUM CHLORIDE 0.9 % IV SOLN
2.0000 g | Freq: Once | INTRAVENOUS | Status: AC
  Administered 2019-12-06: 2 g via INTRAVENOUS
  Filled 2019-12-06: qty 2

## 2019-12-06 MED ORDER — FUROSEMIDE 10 MG/ML IJ SOLN
40.0000 mg | Freq: Once | INTRAMUSCULAR | Status: AC
  Administered 2019-12-06: 40 mg via INTRAVENOUS
  Filled 2019-12-06: qty 4

## 2019-12-06 MED ORDER — SODIUM CHLORIDE 0.9 % IV SOLN
2.0000 g | Freq: Two times a day (BID) | INTRAVENOUS | Status: DC
  Administered 2019-12-06 – 2019-12-07 (×2): 2 g via INTRAVENOUS
  Filled 2019-12-06 (×2): qty 2

## 2019-12-06 MED ORDER — VANCOMYCIN HCL 500 MG/100ML IV SOLN
500.0000 mg | INTRAVENOUS | Status: DC
  Administered 2019-12-07: 500 mg via INTRAVENOUS
  Filled 2019-12-06 (×2): qty 100

## 2019-12-06 MED ORDER — ALBUTEROL SULFATE (2.5 MG/3ML) 0.083% IN NEBU: 5.0000 mg | INHALATION_SOLUTION | Freq: Once | RESPIRATORY_TRACT | Status: DC

## 2019-12-06 MED ORDER — VANCOMYCIN HCL IN DEXTROSE 1-5 GM/200ML-% IV SOLN
1000.0000 mg | Freq: Once | INTRAVENOUS | Status: AC
  Administered 2019-12-06: 1000 mg via INTRAVENOUS
  Filled 2019-12-06: qty 200

## 2019-12-06 MED ORDER — ALBUTEROL SULFATE (2.5 MG/3ML) 0.083% IN NEBU
5.0000 mg | INHALATION_SOLUTION | Freq: Once | RESPIRATORY_TRACT | Status: AC
  Administered 2019-12-06: 5 mg via RESPIRATORY_TRACT
  Filled 2019-12-06: qty 6

## 2019-12-06 MED ORDER — SODIUM CHLORIDE 0.9 % IV SOLN: 2.0000 g | Freq: Three times a day (TID) | INTRAVENOUS | Status: DC

## 2019-12-06 MED ORDER — IPRATROPIUM BROMIDE 0.02 % IN SOLN: 0.5000 mg | Freq: Once | RESPIRATORY_TRACT | Status: DC

## 2019-12-06 MED ORDER — IPRATROPIUM BROMIDE 0.02 % IN SOLN
0.5000 mg | Freq: Once | RESPIRATORY_TRACT | Status: AC
  Administered 2019-12-06: 0.5 mg via RESPIRATORY_TRACT
  Filled 2019-12-06: qty 2.5

## 2019-12-06 MED ORDER — SODIUM CHLORIDE 0.9 % IV SOLN: INTRAVENOUS | Status: AC

## 2019-12-06 NOTE — ED Notes (Signed)
Pt lying in bed awake. Full monitor placed. Will continue to monitor.

## 2019-12-06 NOTE — H&P (Signed)
History and Physical    Amy Randall NLZ:[id number] DOB: June 27, 1934 DOA: 12/06/2019  PCP: Sinda Du, MD  Patient coming from: [place] have personally briefly reviewed patient's old medical records in [place]  Chief Complaint: change in breathing   HPI: Amy Randall is a 86 y.o. female with medical history significant of  Dementia,atrial fibrillation, hypertension,hyperthyroidism, ischemic stroke right PCA, 12/20 on hospice due to advanced dementia who presents to ED with increase work of breathing. Of note patient is a poor historian and history is taken from ED Chart, hospice RN and patient Daughter Arcelia Jew. Per daughter at baseline patient is intermittently alert to self only, follows simple commands, and ambulates with a wheel chair. Per daughter patient has had increase difficulty with breathing over the last 24 hours. She was evaluated by hospice RN earlier in the day and was noted to have increase work of breathing that was intermittent and would resolve. At that time patient was noted to have stable vitals and saturation at NH so no intervention was employed at that time. Unfortunately patient respiratory status decompensated with noted severe persistent tachypnea and patient was transferred to ED early am of 12/06/19 for stabilization and symptom management. Per daughter patient did not have a fever but she was noted to be intermittently congested.  ED Course: IN ed Initial vitals: 98.4, BP142/82  Hr 77,  rr 40 on 100% nrb sat 100% Room air sat lowest 87% patient transitioned to [place] 2L and later transitioned to ra with sat 94%  Labs: wbc 12.5, increase pmn 86, hgb 10.5 down from 12.4 Cr 0.9,bun 31 BNP 996 / 12/20 echo ef 60-655 no mention of diastolic dysfunction  TK:24, EKG : afib  Poor quality , low voltage  Repeat pending Respiratory panel pending Abg:7.5/32/po2 54/sat 88 CT  Thorax:  1. Multifocal opacity in the right lung, concerning for  pneumonia. Small pleural effusions with basilar atelectasis. 2. New T11 compression deformity since 01/02/19, but otherwise age indeterminate. 3. Multinodular goiter deviates the trachea to the right. In the setting of significant comorbidities or limited life expectancy, no follow-up recommended (ref: J Am Coll Radiol. 2015 Feb;12(2): 143-50). 4. Aortic Atherosclerosis (ICD10-I70.0). TX:  With nebs, Lasix 40 mg  Review of Systems: . Unable to obtain due to patient advanced dementia  Past Medical History:  Diagnosis Date  . Atrial fibrillation ([place])   . Dementia ([place])   . Hypertension        CVA Right PCA      Hyperthyroidism  Past Surgical History:  Procedure Laterality Date  . INTRAMEDULLARY (IM) NAIL INTERTROCHANTERIC Right 12/14/2018   Procedure: INTRAMEDULLARY (IM) NAIL INTERTROCHANTRIC;  Surgeon: Nicholes Stairs, MD;  Location: [place];  Service: Orthopedics;  Laterality: Right;     reports that she has never smoked. She has never used smokeless tobacco. No history on file for alcohol and drug.  Allergies  Allergen Reactions  . Bactrim [Sulfamethoxazole-Trimethoprim] Other (See Comments)    Loss of memory  . Ibuprofen Other (See Comments)    Causes bruising  . Orange Fruit [Citrus]     Per patient's daughter, orange snesitivity  . Penicillins Rash    Has patient had a PCN reaction causing immediate rash, facial/tongue/throat swelling, SOB or lightheadedness with hypotension: Yes Has patient had a PCN reaction causing severe rash involving mucus membranes or skin necrosis: No Has patient had a PCN reaction that required hospitalization: No Has patient had a PCN reaction occurring within  the last 10 years: No If all of the above answers are "NO", then may proceed with Cephalosporin use.     Family History  Problem Relation Age of Onset  . Hypertension Father   . Stroke Father   . Diabetes Maternal Grandmother    Prior to Admission medications   Medication  Sig Start Date End Date Taking? Authorizing Provider  acetaminophen (TYLENOL) 325 MG tablet Take 650 mg by mouth 2 (two) times daily.    [provider]  apixaban (ELIQUIS) 2.5 MG TABS tablet Take 1 tablet (2.5 mg total) by mouth 2 (two) times daily. 08/07/19   Barnetta Chapel, MD  diltiazem (CARDIZEM CD) 360 MG 24 hr capsule Take 1 capsule (360 mg total) by mouth daily. 01/04/19   [name], MD  Ensure (ENSURE) Take 237 mLs by mouth 3 (three) times daily between meals.    [provider]  methimazole (TAPAZOLE) 5 MG tablet Take 5 mg by mouth daily. 06/04/19   [provider]  metoprolol tartrate (LOPRESSOR) 25 MG tablet Take 1 tablet (25 mg total) by mouth 2 (two) times daily. 01/04/19   [name], MD  rivastigmine (EXELON) 13.3 MG/24HR Place 13.3 mg onto the skin daily. 06/10/19   [provider]    Physical Exam: Vitals:   12/06/19 0500 12/06/19 0515 12/06/19 0530 12/06/19 0545  BP: 119/83 (!) 134/94 (!) 148/92 (!) 139/109  Pulse: 78 84  99  Resp: (!) 27 (!) 37 (!) 22 (!) 39  Temp:      SpO2: 100% 99%  95%  Weight:      Height:        Constitutional: NAD, calm, comfortable Vitals:   12/06/19 0500 12/06/19 0515 12/06/19 0530 12/06/19 0545  BP: 119/83 (!) 134/94 (!) 148/92 (!) 139/109  Pulse: 78 84  99  Resp: (!) 27 (!) 37 (!) 22 (!) 39  Temp:      SpO2: 100% 99%  95%  Weight:      Height:      KNL:ZJQBHA elderly female resting mild use of accessory muslces Eyes: PERRL, lids and conjunctivae normal ENMT: Mucous membranes are dry. Normal dentition.  Respiratory: crackles on right diminished b/l, intermittent use of accessory muscle use. Increase respiratory rate Cardiovascular:IRRegular  Rhythm,normal rate, no murmurs / rubs / gallops. No extremity edema. 2+ pedal pulses. Abdomen: no tenderness, mild line lipoma. No hepatosplenomegaly. Bowel sounds positive.  Musculoskeletal: no clubbing / cyanosis. No joint deformity upper and  lower extremities. Good ROM, no contractures.  Skin: no rashes, lesions, ulcers. No induration Neurologic: CN 2-12 grossly intact. Sensation intact, Moving all fours Strength 3+/5 in all 4.  Psychiatric:unable to assess judgment and insight. Alert x0. No noted agitation   Labs on Admission: I have personally reviewed following labs and imaging studies  CBC: Recent Labs  Lab 12/06/19 0446  WBC 12.5*  NEUTROABS 10.8*  HGB 10.5*  HCT 34.4*  MCV 94.5  PLT 154   Basic Metabolic Panel: Recent Labs  Lab 12/06/19 0446  NA 140  K 4.2  CL 107  CO2 24  GLUCOSE 125*  BUN 31*  CREATININE 0.90  CALCIUM 8.4*   GFR: Estimated Creatinine Clearance: 31.7 mL/min (by C-G formula based on SCr of 0.9 mg/dL). Liver Function Tests: No results for input(s): AST, ALT, ALKPHOS, BILITOT, PROT, ALBUMIN in the last 168 hours. No results for input(s): LIPASE, AMYLASE in the last 168 hours. No results for input(s): AMMONIA in the last 168 hours. Coagulation  Profile: No results for input(s): INR, PROTIME in the last 168 hours. Cardiac Enzymes: No results for input(s): CKTOTAL, CKMB, CKMBINDEX, TROPONINI in the last 168 hours. BNP (last 3 results) No results for input(s): PROBNP in the last 8760 hours. HbA1C: No results for input(s): HGBA1C in the last 72 hours. CBG: No results for input(s): GLUCAP in the last 168 hours. Lipid Profile: No results for input(s): CHOL, HDL, LDLCALC, TRIG, CHOLHDL, LDLDIRECT in the last 72 hours. Thyroid Function Tests: No results for input(s): TSH, T4TOTAL, FREET4, T3FREE, THYROIDAB in the last 72 hours. Anemia Panel: No results for input(s): VITAMINB12, FOLATE, FERRITIN, TIBC, IRON, RETICCTPCT in the last 72 hours. Urine analysis:    Component Value Date/Time   COLORURINE YELLOW 01/02/2019 1022   APPEARANCEUR CLEAR 01/02/2019 1022   LABSPEC 1.011 01/02/2019 1022   PHURINE 7.0 01/02/2019 1022   GLUCOSEU NEGATIVE 01/02/2019 1022   HGBUR NEGATIVE 01/02/2019  1022   BILIRUBINUR NEGATIVE 01/02/2019 1022   KETONESUR NEGATIVE 01/02/2019 1022   PROTEINUR NEGATIVE 01/02/2019 1022   NITRITE NEGATIVE 01/02/2019 1022   LEUKOCYTESUR NEGATIVE 01/02/2019 1022    Radiological Exams on Admission: CT Chest Wo Contrast  Result Date: 12/06/2019 CLINICAL DATA:  Shortness of breath EXAM: CT CHEST WITHOUT CONTRAST TECHNIQUE: Multidetector CT imaging of the chest was performed following the standard protocol without IV contrast. COMPARISON:  01/02/2019 CTA chest FINDINGS: Cardiovascular: Moderate cardiomegaly with calcific aortic and coronary artery atherosclerosis. No pericardial effusion. Mediastinum/Nodes: Multinodular goiter deviates the trachea to the right. Lungs/Pleura: Multifocal opacity in the right lung, predominantly in the right upper and lower lobes. Small pleural effusions. Upper Abdomen: Large hepatic cyst is incompletely visualized. Musculoskeletal: There is a new compression deformity at T11. IMPRESSION: 1. Multifocal opacity in the right lung, concerning for pneumonia. Small pleural effusions with basilar atelectasis. 2. New T11 compression deformity since 01/02/19, but otherwise age indeterminate. 3. Multinodular goiter deviates the trachea to the right. In the setting of significant comorbidities or limited life expectancy, no follow-up recommended (ref: J Am Coll Radiol. 2015 Feb;12(2): 143-50). 4. Aortic Atherosclerosis (ICD10-I70.0). Electronically Signed   By: Deatra Robinson M.D.   On: 12/06/2019 06:47   DG Chest Portable 1 View  Result Date: 12/06/2019 CLINICAL DATA:  Shortness of breath EXAM: PORTABLE CHEST 1 VIEW COMPARISON:  08/01/2019 FINDINGS: Mild cardiomegaly. No focal airspace consolidation. No pleural effusion or pneumothorax. No pulmonary edema. IMPRESSION: Mild cardiomegaly without focal airspace disease or pulmonary edema. Electronically Signed   By: Deatra Robinson M.D.   On: 12/06/2019 05:29    EKG: Independently reviewed. See  above  Assessment/Plan  HCAP with hypoxic respiratory failure /tachypnea -presumed bacterial , start broad spectrum antibotics per HCAP protocol -f/u on respiratory viral panel /blood cultures/urinary antigens  -de-escalate antibiotics as able  -supplemental O2, pulmonary toilet, wean O2 as able   Anemia  -check anemia labs / FOBT   Milk AKI -due to volume status from poor intake  -gently ivfs    T11 compression fracture -supportive care    Recent ischemic CVA Right PCA  -continue on Eliquis for secondary CVA prophalasix   Permanent Afib  -continue Eliquis  As per daughters request -rate controlled continue metoprolol and diltiazem as blood pressure tolerates   Advance dementia  - placed on hospice 08/15/19 -continue home medication -baseline alert to self , wheel chair bound  -palliative care to follow while in     house   Hyperthyroidism  -check tsh  -continue tapazole  -ct thorax noting multinodular  goiter  Hypocalcemia mild -presumed due to low albumin -albumin pending -continue to monitor   DVT prophylaxis: Eliquis  Code Status:DNI/DNR Family Communication: Daughter  Updated and all questions answered  Disposition Plan: daughter would like to transition to inpatient hospice [facility] in 24 hours if bed is available.  Consults called: Palliative care consult placed Admission status:tele inpatient   Lurline Del MD [facility]  If 7PM-7AM, please contact night-coverage [email or web address] Password [facility]  12/06/2019, 7:35 AM

## 2019-12-06 NOTE — Progress Notes (Signed)
A consult was received from an ED physician for vancomycin per pharmacy dosing.  The patient's profile has been reviewed for ht/wt/allergies/indication/available labs.    A one time order has been placed for vancomycin 1000 mg x1.  Further antibiotics/pharmacy consults should be ordered by admitting physician if indicated.                       Thank you, Lucia Gaskins 12/06/2019  6:57 AM

## 2019-12-06 NOTE — ED Triage Notes (Signed)
DNR. Hospice care. No MOST form. Had a change of breathing that began at "some unknown point yesterday. [facility] called hospice nurse, hospice nurse called Dr and he wants her to be stabilized and then sent back. Usually wears 3L ncbut was noncompliant and sats were 87%. Diminished with audible wheezes.

## 2019-12-06 NOTE — Progress Notes (Signed)
[facility]- [facility] Hospitalized hospice patient visit.  Amy Randall, current hospice patient with terminal diagnosis of cerebrovascular disease. Patient had a change in respiratory status and was wheezing. Hospice on call nurse was notified and she consulted with the hospice physcian who advised the patient go to the ED for evaluation and treatment. Patient was admitted to [facility] on 12/06/2019 with a diagnosis of HCAP. Per Dr. Kern Reap this is a related hospital admission.  Visited patient at bedside, her daughter was present. Patient did not appear in distress. She is alert and disoriented at baseline. There is concern that she may have aspirated.  Her daughter wants her infection treated and hopefully return to [facility].   VS: 97.5, 127/93, 91, 20, 100% on 2Lnc I/O:  Abnormal labs: pH art 7.506, pO2 art 54.6, acid base ex 2.6, Iron 21, saturation ratios 7, RBC 3.69, Imm Retic 18.0, BUN 31, Calcium 8.4, BNP 996.4, Troponin 46, WBC 12.5, RBC 3.64, Hgb 10.5, HCT 34.4, RDW 16.4 Epic Imaging:  IMPRESSION: 1. Multifocal opacity in the right lung, concerning for pneumonia. Small pleural effusions with basilar atelectasis. 2. New T11 compression deformity since 01/02/19, but otherwise age indeterminate. 3. Multinodular goiter deviates the trachea to the right. In the setting of significant comorbidities or limited life expectancy, no follow-up recommended (ref: J Am Coll Radiol. 2015 Feb;12(2): 143-50). 4. Aortic Atherosclerosis (ICD10-I70.0).  Per MD notes: Treat HCAP with hypoxic respiratory failure /tachypnea -presumed bacterial , start broad spectrum antibotics per HCAP protocol -f/u on respiratory viral panel /blood cultures/urinary antigens  -de-escalate antibiotics as able  Hyperthyroidism -ct thorax noting multinodular goiter  Discharge Planning- ongoing. Daughter is interested in transferring patient to [facility] for symptom management. There was no  bed available today.  Family contact: as noted above.  GOC: DNR. Treat symptoms, provide comfort care.   Medication list and TS placed on shadow chart.   Should Patient need ambulance transport at discharge please use [facility] as they contract this service for our active hospice patients.   Please call with any hospice related questions.  Elsie Saas, RN, CCM [facility] Liaison [phone]

## 2019-12-06 NOTE — Progress Notes (Signed)
Pharmacy Antibiotic Note  Amy Randall is a 86 y.o. female admitted on 12/06/2019 with increased work of breathing. Pt has hx of dementia, AFib, HTN, ischemic stroke  Pharmacy has been consulted for vancomycin dosing or pna  Plan: Vancomycin 1gm IV x 1 in ED then 500mg  q24h (AUC 525.2, Scr 0.9) Follow renal function, cultures and clinical course Cefepime per MD, adjusted to 2gm IV q12h for renal function  Height: 5\' 1"  (154.9 cm) Weight: 43.1 kg (95 lb) IBW/kg (Calculated) : 47.8  Temp (24hrs), Avg:98.4 F (36.9 C), Min:98.3 F (36.8 C), Max:98.4 F (36.9 C)  Recent Labs  Lab 12/06/19 0446  WBC 12.5*  CREATININE 0.90    Estimated Creatinine Clearance: 31.7 mL/min (by C-G formula based on SCr of 0.9 mg/dL).    Allergies  Allergen Reactions  . Aricept [Donepezil]     Per mar  . Bactrim [Sulfamethoxazole-Trimethoprim] Other (See Comments)    Loss of memory  . Ibuprofen Other (See Comments)    Causes bruising  . Orange Fruit [Citrus]     Per patient's daughter, orange snesitivity  . Penicillins Rash    Has patient had a PCN reaction causing immediate rash, facial/tongue/throat swelling, SOB or lightheadedness with hypotension: Yes Has patient had a PCN reaction causing severe rash involving mucus membranes or skin necrosis: No Has patient had a PCN reaction that required hospitalization: No Has patient had a PCN reaction occurring within the last 10 years: No If all of the above answers are "NO", then may proceed with Cephalosporin use.     Antimicrobials this admission: 5/1 vanc >> 5/1 cefepime >> Dose adjustments this admission:   Microbiology results: 5/1 BCx:  5/1 Sputum:    Thank you for allowing pharmacy to be a part of this patient's care.  7/1 RPh 12/06/2019, 11:26 AM

## 2019-12-06 NOTE — ED Notes (Signed)
Pt lying in bed awake and pulling at lines. Breathing treatment performed. Will continue to monitor.

## 2019-12-06 NOTE — ED Provider Notes (Signed)
TIME SEEN: 4:23 AM  CHIEF COMPLAINT: Tachypnea  HPI: Patient is an 86 year old female with history of atrial fibrillation, hypertension, dementia who lives at [facility] who presents to the emergency department with EMS for concerns for changes in her respiratory status that occurred tonight.  History obtained by hospice nurse, Albin Felling.  Albin Felling reports that around 2:30 AM she was contacted by the staff at [facility] stating they noticed a change in patient's breathing.  Albin Felling reports that she went to the nursing facility to evaluate the patient and noticed a respiratory rate of 36 and that she was wheezing.  She states she contacted the hospice nurse practitioner on-call Angelica Pou who recommended sending the patient to the emergency department to be "stabilized".  They report that her terminal diagnosis is Alzheimer's.  She has no known respiratory medical issues.  Albin Felling reports that the nursing home staff did not have albuterol to give the patient.  They have attempted to contact patient's daughter [name] twice without success.  Patient is a DNR.  ROS: Level 5 caveat secondary to dementia  PAST MEDICAL HISTORY/PAST SURGICAL HISTORY:  Past Medical History:  Diagnosis Date  . Atrial fibrillation (HCC)   . Dementia (HCC)   . Hypertension     MEDICATIONS:  Prior to Admission medications   Medication Sig Start Date End Date Taking? Authorizing Provider  acetaminophen (TYLENOL) 325 MG tablet Take 650 mg by mouth 2 (two) times daily.    [provider]  apixaban (ELIQUIS) 2.5 MG TABS tablet Take 1 tablet (2.5 mg total) by mouth 2 (two) times daily. 08/07/19   Barnetta Chapel, MD  diltiazem (CARDIZEM CD) 360 MG 24 hr capsule Take 1 capsule (360 mg total) by mouth daily. 01/04/19   [name], MD  Ensure (ENSURE) Take 237 mLs by mouth 3 (three) times daily between meals.    [provider]  methimazole (TAPAZOLE) 5 MG tablet Take 5 mg by mouth daily. 06/04/19    [provider]  metoprolol tartrate (LOPRESSOR) 25 MG tablet Take 1 tablet (25 mg total) by mouth 2 (two) times daily. 01/04/19   [name], MD  rivastigmine (EXELON) 13.3 MG/24HR Place 13.3 mg onto the skin daily. 06/10/19   [provider]    ALLERGIES:  Allergies  Allergen Reactions  . Bactrim [Sulfamethoxazole-Trimethoprim] Other (See Comments)    Loss of memory  . Ibuprofen Other (See Comments)    Causes bruising  . Orange Fruit [Citrus]     Per patient's daughter, orange snesitivity  . Penicillins Rash    Has patient had a PCN reaction causing immediate rash, facial/tongue/throat swelling, SOB or lightheadedness with hypotension: Yes Has patient had a PCN reaction causing severe rash involving mucus membranes or skin necrosis: No Has patient had a PCN reaction that required hospitalization: No Has patient had a PCN reaction occurring within the last 10 years: No If all of the above answers are "NO", then may proceed with Cephalosporin use.     SOCIAL HISTORY:  Social History   Tobacco Use  . Smoking status: Never Smoker  . Smokeless tobacco: Never Used  Substance Use Topics  . Alcohol use: Not on file    FAMILY HISTORY: Family History  Problem Relation Age of Onset  . Hypertension Father   . Stroke Father   . Diabetes Maternal Grandmother     EXAM: BP (!) 142/82 (BP Location: Left Arm)   Pulse 77   Temp 98.4 F (36.9 C)   Resp (!)  40   Ht 5\' 1"  (1.549 m)   Wt 43.1 kg   SpO2 100%   BMI 17.95 kg/m  CONSTITUTIONAL: Alert, elderly, demented, unable to answer questions appropriately HEAD: Normocephalic EYES: Conjunctivae clear, pupils appear equal, EOM appear intact ENT: normal nose; moist mucous membranes NECK: Supple, normal ROM CARD: Irregularly irregular but rate controlled; S1 and S2 appreciated; no murmurs, no clicks, no rubs, no gallops RESP: Patient is tachypneic.  Currently on nasal cannula.  Inspiratory and expiratory  wheezes.  No rhonchi or rales appreciated.  Increased work of breathing but does not appear to be in distress.  Sats 87% on RA. ABD/GI: Normal bowel sounds; non-distended; soft, non-tender, no rebound, no guarding, no peritoneal signs, no hepatosplenomegaly BACK:  The back appears normal EXT: Normal ROM in all joints; no deformity noted, minimal LE edema; no cyanosis, no calf tenderness or calf swelling SKIN: Normal color for age and race; warm; no rash on exposed skin NEURO: Moves all extremities equally PSYCH: The patient's mood and manner are appropriate.   MEDICAL DECISION MAKING: Patient here with tachypnea, wheezing.  No known history of asthma or COPD.  Does have documented history of CHF with previous admission for the same.  Currently in atrial fibrillation which is her baseline but rate controlled.  On exam, patient is pleasantly demented.  She does not appear significantly volume overloaded peripherally.  Attempted to get in touch with patient's daughter Langley Gauss at [phone] and left a message.  Unable to confirm what family would like done the situation.  We will proceed with labs, EKG, chest x-ray.  I have also attempted to contact [facility] at [phone] without answer.  ED PROGRESS: Labs reviewed/interpreted and show elevated BNP and mildly elevated troponin again likely secondary to CHF but chest x-ray reviewed/interpreted and shows cardiomegaly without other acute change.  Will provide breathing treatment to see if she gets any relief from this.  Will likely need CT of the chest for further evaluation.  I have attempted to get in contact with [facility] and patient's daughter a second time without success.  5:58 AM  Spoke with daughter Langley Gauss by phone.  She confirms patient is a DNR, DNI.  Would not want invasive procedures.  Would not want a feeding tube.  Daughter feels patient would want medical treatment including antibiotics, IVF and in this setting breathing treatments  and diuresis as needed.  Daughter is fine with stabilization in the ER but at this time is not sure if they would want hospitalization and would like to talk to the hospice team first and call me back.  Patient currently receiving breathing treatment and given I have high clinical suspicion for CHF exacerbation, will give 40 mg of IV Lasix and proceed with noncontrast CT of the chest.  6:18 AM  Spoke with patient's daughter again by phone.  She has spoke to the hospice team and feels comfortable with admission with palliative care following along while in the hospital.  She is comfortable with BiPAP if needed but does not want her mother to be uncomfortable or "tortured".  We will escalate as needed to a point but no invasive procedures to be performed.  6:45 AM  Pt's breath sounds and aeration improving with neb.  Daughter confirms no h/o bronchospasm, asthma, COPD.   Patient 94% on RA but still tachypneic.  Trying to pull out PIC in righ hand.  Will place in left wrist restraint.  Unable to redirect.  No significantly agitated.  6:55 AM  Pt's CT chest concerning for PNA in RLL.  Will cover broadly given patient lives in nursing home.  COVID pending.  Will admit.  7:24 AM Discussed patient's case with hospitalist, Dr. Maisie Fus.  I have recommended admission and patient (and family if present) agree with this plan. Admitting physician will place admission orders.   Hospitalist requests ABG to help with diagnosis, treatment, prognosis.    7:28 AM  Updated daughter by phone.  She on her way to the ED.  Agrees with antibiotics and admission.  Understands patient is very ill and has poor prognosis.  Will continue to re-evaluate goals of care but hopes treatment will improve her mother's respiratory status to help with comfort as comfort is her number one goal.  Patient has had both COVID vaccines per daughter.  Last vaccine was 09/25/19.    Daughter reports patient appeared to have breathing problems  yesterday.  Given h/o CVA, she is concerned for aspiration.    I reviewed all nursing notes, vitals, pertinent previous records and reviewed/interpreted all EKGs, lab and urine results, imaging (as available).    EKG Interpretation  Date/Time:  Saturday Dec 06 2019 05:31:06 EDT Ventricular Rate:  86 PR Interval:    QRS Duration: 86 QT Interval:  407 QTC Calculation: 487 R Axis:   93 Text Interpretation: Atrial fibrillation Ventricular premature complex Anterolateral infarct, age indeterminate Poor data quality in lead III New ST changes in lateral leads compared to December 2020 Confirmed by Rochele Raring [phone]) on 12/06/2019 5:36:20 AM        CRITICAL CARE Performed by: Baxter Hire Maekayla Giorgio   Total critical care time: 50 minutes  Critical care time was exclusive of separately billable procedures and treating other patients.  Critical care was necessary to treat or prevent imminent or life-threatening deterioration.  Critical care was time spent personally by me on the following activities: development of treatment plan with patient and/or surrogate as well as nursing, discussions with consultants, evaluation of patient's response to treatment, examination of patient, obtaining history from patient or surrogate, ordering and performing treatments and interventions, ordering and review of laboratory studies, ordering and review of radiographic studies, pulse oximetry and re-evaluation of patient's condition.   [place] SHANTEL HELWIG was evaluated in Emergency Department on 12/06/2019 for the symptoms described in the history of present illness. She was evaluated in the context of the global COVID-19 pandemic, which necessitated consideration that the patient might be at risk for infection with the SARS-CoV-2 virus that causes COVID-19. Institutional protocols and algorithms that pertain to the evaluation of patients at risk for COVID-19 are in a state of rapid change based on information released by  regulatory bodies including the CDC and federal and state organizations. These policies and algorithms were followed during the patient's care in the ED.      Tiphani Mells, Layla Maw, DO 12/06/19 0730

## 2019-12-06 NOTE — Evaluation (Signed)
Clinical/Bedside Swallow Evaluation Patient Details  Name: Amy Randall MRN: [id number] Date of Birth: 1934-02-26  Today's Date: 12/06/2019 Time: SLP Start Time (ACUTE ONLY): 1435 SLP Stop Time (ACUTE ONLY): 1505 SLP Time Calculation (min) (ACUTE ONLY): 30 min  Past Medical History:  Past Medical History:  Diagnosis Date  . Atrial fibrillation ([place])   . Dementia ([place])   . Hypertension    Past Surgical History:  Past Surgical History:  Procedure Laterality Date  . INTRAMEDULLARY (IM) NAIL INTERTROCHANTERIC Right 12/14/2018   Procedure: INTRAMEDULLARY (IM) NAIL INTERTROCHANTRIC;  Surgeon: Nicholes Stairs, MD;  Location: [facility];  Service: Orthopedics;  Laterality: Right;   HPI:  Ms Amy Randall, 86y/f, presented to ED from SNF [facility] care) with tachypnea and intermittent congestion. PMH of dementia, A-fib, hypertension, hyperthyroidism, Ischemic stroke righ PCA 12/20. Patient seen by ST for swallowing after stroke 12/20. At that time she presented with mild oropharyngeal dysphagia likely related to dementia. At that time she required Dys 2, thin liquid diet.    Assessment / Plan / Recommendation Clinical Impression  Clinical swallow evaluation completed at bedside. Patient's daughter was at bedside and was the historian for pt. She reports pt has always needed to cut her foods very small for intake for as long as she can recall. Pt was seen by ST 12/20 after a stroke and was DC'd on a Dys 2, thin liquid diet. Oral motor exam revealed generalized weakness. PO trials of ice chips, water cup sips, straw and serial straw sips were tolerated with no s/s of aspiration. Pureed and cracker were masticated and manipulated orally very well with minimal oral residue.  Daughter reported her mother being stressed and saddened by a visit from her sister last week and felt that this decline in health may have been related or a cause for aspiration sometime last week. Recommend a Dys 3, thin  liquid diet based on her tolerance during evaluation. ST will monitor diet tolerance and possible need for MBS. Daughter was educated about aspiration precautions and plan of care and was agreeable.  SLP Visit Diagnosis: Dysphagia, oropharyngeal phase (R13.12)    Aspiration Risk  Mild aspiration risk    Diet Recommendation Dysphagia 3 (Mech soft);Thin liquid   Liquid Administration via: Cup;Straw Medication Administration: Whole meds with puree Supervision: Staff to assist with self feeding Compensations: Minimize environmental distractions;Slow rate;Small sips/bites Postural Changes: Seated upright at 90 degrees;Remain upright for at least 30 minutes after po intake    Other  Recommendations Oral Care Recommendations: Oral care BID   Follow up Recommendations Skilled Nursing facility      Frequency and Duration min 2x/week  2 weeks       Prognosis Prognosis for Safe Diet Advancement: Good Barriers to Reach Goals: Cognitive deficits      Swallow Study   General Date of Onset: 12/05/19 HPI: Ms Amy Randall, 86y/f, presented to ED from SNF (Hospice/memory care) with tachypnea and intermittent congestion. PMH of dementia, A-fib, hypertension, hyperthyroidism, Ischemic stroke righ PCA 12/20. Patient seen by ST for swallowing after stroke 12/20. At that time she presented with mild oropharyngeal dysphagia likely related to dementia. At that time she required Dys 2, thin liquid diet.  Type of Study: Bedside Swallow Evaluation Previous Swallow Assessment: 12/20 Diet Prior to this Study: NPO Temperature Spikes Noted: No Respiratory Status: Room air History of Recent Intubation: No Behavior/Cognition: Alert;Pleasant mood Oral Cavity Assessment: Within Functional Limits Oral Care Completed by SLP: Yes Oral Cavity - Dentition: Adequate natural  dentition Vision: Functional for self-feeding Self-Feeding Abilities: Able to feed self Patient Positioning: Upright in bed Baseline Vocal  Quality: Normal Volitional Cough: Weak Volitional Swallow: Able to elicit    Oral/Motor/Sensory Function Overall Oral Motor/Sensory Function: Generalized oral weakness   Ice Chips Ice chips: Within functional limits Presentation: Spoon   Thin Liquid Thin Liquid: Within functional limits Presentation: Cup;Straw    Nectar Thick Nectar Thick Liquid: Not tested   Honey Thick Honey Thick Liquid: Not tested   Puree Puree: Within functional limits Presentation: Self Fed   Solid     Solid: Within functional limits Presentation: Self Amy Mccoy, MA CCC-SLP 12/06/2019,3:33 PM

## 2019-12-06 NOTE — Progress Notes (Signed)
RT drew ABG per order at 0737, lab was contacted and sample was sent to them at 0739. RT will continue to monitor.

## 2019-12-07 DIAGNOSIS — R531 Weakness: Secondary | ICD-10-CM

## 2019-12-07 DIAGNOSIS — I4821 Permanent atrial fibrillation: Secondary | ICD-10-CM

## 2019-12-07 DIAGNOSIS — Z8673 Personal history of transient ischemic attack (TIA), and cerebral infarction without residual deficits: Secondary | ICD-10-CM

## 2019-12-07 DIAGNOSIS — E0590 Thyrotoxicosis, unspecified without thyrotoxic crisis or storm: Secondary | ICD-10-CM

## 2019-12-07 DIAGNOSIS — F03C0 Unspecified dementia, severe, without behavioral disturbance, psychotic disturbance, mood disturbance, and anxiety: Secondary | ICD-10-CM

## 2019-12-07 DIAGNOSIS — J189 Pneumonia, unspecified organism: Secondary | ICD-10-CM

## 2019-12-07 DIAGNOSIS — Z7189 Other specified counseling: Secondary | ICD-10-CM

## 2019-12-07 DIAGNOSIS — F0390 Unspecified dementia without behavioral disturbance: Secondary | ICD-10-CM

## 2019-12-07 DIAGNOSIS — D638 Anemia in other chronic diseases classified elsewhere: Secondary | ICD-10-CM

## 2019-12-07 DIAGNOSIS — Z515 Encounter for palliative care: Secondary | ICD-10-CM

## 2019-12-07 LAB — COMPREHENSIVE METABOLIC PANEL
ALT: 24 U/L (ref 0–44)
AST: 27 U/L (ref 15–41)
Albumin: 2.9 g/dL — ABNORMAL LOW (ref 3.5–5.0)
Alkaline Phosphatase: 177 U/L — ABNORMAL HIGH (ref 38–126)
Anion gap: 6 (ref 5–15)
BUN: 27 mg/dL — ABNORMAL HIGH (ref 8–23)
CO2: 26 mmol/L (ref 22–32)
Calcium: 8.4 mg/dL — ABNORMAL LOW (ref 8.9–10.3)
Chloride: 105 mmol/L (ref 98–111)
Creatinine, Ser: 0.91 mg/dL (ref 0.44–1.00)
GFR calc Af Amer: >60 mL/min (ref >60)
GFR calc non Af Amer: 58 mL/min — ABNORMAL LOW (ref >60)
Glucose, Bld: 106 mg/dL — ABNORMAL HIGH (ref 70–99)
Potassium: 3.7 mmol/L (ref 3.5–5.1)
Sodium: 137 mmol/L (ref 135–145)
Total Bilirubin: 1.6 mg/dL — ABNORMAL HIGH (ref 0.3–1.2)
Total Protein: 5.8 g/dL — ABNORMAL LOW (ref 6.5–8.1)

## 2019-12-07 LAB — CBC
HCT: 34.8 % — ABNORMAL LOW (ref 36.0–46.0)
Hemoglobin: 10.7 g/dL — ABNORMAL LOW (ref 12.0–15.0)
MCH: 28.5 pg (ref 26.0–34.0)
MCHC: 30.7 g/dL (ref 30.0–36.0)
MCV: 92.6 fL (ref 80.0–100.0)
Platelets: 179 10*3/uL (ref 150–400)
RBC: 3.76 MIL/uL — ABNORMAL LOW (ref 3.87–5.11)
RDW: 16.4 % — ABNORMAL HIGH (ref 11.5–15.5)
WBC: 9.2 10*3/uL (ref 4.0–10.5)
nRBC: 0 % (ref 0.0–0.2)

## 2019-12-07 MED ORDER — ACETAMINOPHEN 325 MG PO TABS
650.0000 mg | ORAL_TABLET | ORAL | Status: DC | PRN
  Administered 2019-12-07 – 2019-12-09 (×4): 650 mg via ORAL
  Filled 2019-12-07 (×4): qty 2

## 2019-12-07 MED ORDER — ACETAMINOPHEN 650 MG RE SUPP: 325.0000 mg | RECTAL | Status: DC | PRN

## 2019-12-07 MED ORDER — DILTIAZEM HCL ER COATED BEADS 180 MG PO CP24
360.0000 mg | ORAL_CAPSULE | Freq: Every day | ORAL | Status: DC
  Administered 2019-12-07 – 2019-12-09 (×2): 360 mg via ORAL
  Filled 2019-12-07 (×3): qty 2

## 2019-12-07 MED ORDER — METHIMAZOLE 5 MG PO TABS
5.0000 mg | ORAL_TABLET | Freq: Every day | ORAL | Status: DC
  Administered 2019-12-07 – 2019-12-09 (×3): 5 mg via ORAL
  Filled 2019-12-07 (×3): qty 1

## 2019-12-07 MED ORDER — LOPERAMIDE HCL 2 MG PO CAPS: 2.0000 mg | ORAL_CAPSULE | Freq: Four times a day (QID) | ORAL | Status: DC | PRN

## 2019-12-07 MED ORDER — SODIUM CHLORIDE 0.9 % IV SOLN
1.0000 g | Freq: Three times a day (TID) | INTRAVENOUS | Status: DC
  Administered 2019-12-07 – 2019-12-08 (×2): 1 g via INTRAVENOUS
  Filled 2019-12-07 (×4): qty 1

## 2019-12-07 MED ORDER — METOPROLOL TARTRATE 5 MG/5ML IV SOLN
INTRAVENOUS | Status: AC
  Filled 2019-12-07: qty 5

## 2019-12-07 MED ORDER — LORAZEPAM 0.5 MG PO TABS: 0.2500 mg | ORAL_TABLET | Freq: Four times a day (QID) | ORAL | Status: DC | PRN

## 2019-12-07 MED ORDER — METOPROLOL TARTRATE 25 MG PO TABS
25.0000 mg | ORAL_TABLET | Freq: Two times a day (BID) | ORAL | Status: DC
  Administered 2019-12-07: 25 mg via ORAL
  Filled 2019-12-07 (×2): qty 1

## 2019-12-07 MED ORDER — APIXABAN 2.5 MG PO TABS
2.5000 mg | ORAL_TABLET | Freq: Two times a day (BID) | ORAL | Status: DC
  Administered 2019-12-07 – 2019-12-09 (×6): 2.5 mg via ORAL
  Filled 2019-12-07 (×6): qty 1

## 2019-12-07 MED ORDER — ONDANSETRON HCL 4 MG/2ML IJ SOLN: 4.0000 mg | Freq: Four times a day (QID) | INTRAMUSCULAR | Status: DC | PRN

## 2019-12-07 MED ORDER — DILTIAZEM HCL 90 MG PO TABS
90.0000 mg | ORAL_TABLET | Freq: Once | ORAL | Status: DC
  Filled 2019-12-07: qty 1

## 2019-12-07 MED ORDER — ATORVASTATIN CALCIUM 10 MG PO TABS: 20.0000 mg | ORAL_TABLET | Freq: Every day | ORAL | Status: DC

## 2019-12-07 MED ORDER — SODIUM CHLORIDE 0.9 % IV SOLN: INTRAVENOUS | Status: DC

## 2019-12-07 MED ORDER — ENALAPRILAT 1.25 MG/ML IV SOLN
1.2500 mg | Freq: Four times a day (QID) | INTRAVENOUS | Status: DC | PRN
  Filled 2019-12-07: qty 1

## 2019-12-07 MED ORDER — METOPROLOL TARTRATE 5 MG/5ML IV SOLN
5.0000 mg | Freq: Three times a day (TID) | INTRAVENOUS | Status: DC
  Administered 2019-12-07 – 2019-12-08 (×3): 5 mg via INTRAVENOUS
  Filled 2019-12-07 (×3): qty 5

## 2019-12-07 MED ORDER — ENSURE ENLIVE PO LIQD
237.0000 mL | Freq: Three times a day (TID) | ORAL | Status: DC
  Administered 2019-12-07 – 2019-12-09 (×7): 237 mL via ORAL

## 2019-12-07 MED ORDER — HYDRALAZINE HCL 20 MG/ML IJ SOLN: 5.0000 mg | Freq: Four times a day (QID) | INTRAMUSCULAR | Status: DC | PRN

## 2019-12-07 MED ORDER — LORAZEPAM 2 MG/ML IJ SOLN
0.2500 mg | Freq: Three times a day (TID) | INTRAMUSCULAR | Status: DC | PRN
  Administered 2019-12-07: 0.25 mg via INTRAVENOUS
  Filled 2019-12-07: qty 1

## 2019-12-07 MED ORDER — RIVASTIGMINE 13.3 MG/24HR TD PT24
13.3000 mg | MEDICATED_PATCH | Freq: Every day | TRANSDERMAL | Status: DC
  Administered 2019-12-07 – 2019-12-09 (×3): 13.3 mg via TRANSDERMAL
  Filled 2019-12-07 (×3): qty 1

## 2019-12-07 NOTE — Progress Notes (Signed)
PROGRESS NOTE    AUDELIA Randall  IRJ:[id number] DOB: 1934/03/24 DOA: 12/06/2019 PCP: [name], MD    Chief Complaint  Patient presents with  . Respiratory Distress    Brief Narrative:  HPI per Dr. Zonia Kief Amy Randall is a 86 y.o. female with medical history significant of  Dementia,atrial fibrillation, hypertension,hyperthyroidism, ischemic stroke right PCA, 12/20 on hospice due to advanced dementia who presents to ED with increase work of breathing. Of note patient is a poor historian and history is taken from ED Chart, hospice RN and patient Daughter Amy Randall. Per daughter at baseline patient is intermittently alert to self only, follows simple commands, and ambulates with a wheel chair. Per daughter patient has had increase difficulty with breathing over the last 24 hours. She was evaluated by hospice RN earlier in the day and was noted to have increase work of breathing that was intermittent and would resolve. At that time patient was noted to have stable vitals and saturation at NH so no intervention was employed at that time. Unfortunately patient respiratory status decompensated with noted severe persistent tachypnea and patient was transferred to ED early am of 12/06/19 for stabilization and symptom management. Per daughter patient did not have a fever but she was noted to be intermittently congested.  ED Course: IN ed Initial vitals: 98.4, BP142/82  Hr 77,  rr 40 on 100% nrb sat 100% Room air sat lowest 87% patient transitioned to Stella 2L and later transitioned to ra with sat 94%  Labs: wbc 12.5, increase pmn 86, hgb 10.5 down from 12.4 Cr 0.9,bun 31 BNP 996 / 12/20 echo ef 60-655 no mention of diastolic dysfunction  CE:46, EKG : afib  Poor quality , low voltage  Repeat pending Respiratory panel pending Abg:7.5/32/po2 54/sat 88 CT  Thorax:  1. Multifocal opacity in the right lung, concerning for pneumonia. Small pleural effusions with basilar atelectasis. 2.  New T11 compression deformity since 01/02/19, but otherwise age indeterminate. 3. Multinodular goiter deviates the trachea to the right. In the setting of significant comorbidities or limited life expectancy, no follow-up recommended (ref: J Am Coll Radiol. 2015 Feb;12(2): 143-50). 4. Aortic Atherosclerosis (ICD10-I70.0). TX:  With nebs, Lasix 40 mg  Assessment & Plan:   Active Problems:   HCAP (healthcare-associated pneumonia)   Multifocal pneumonia   Hyperthyroidism   Permanent atrial fibrillation (HCC)   Advanced dementia (HCC)   History of CVA (cerebrovascular accident)   Anemia of chronic disease   #1 acute hypoxic respiratory failure secondary to HCAP versus aspiration pneumonia Patient admitted acute hypoxic respiratory failure with tachypnea, worsening confusion, increasing shortness of breath and increased work of breathing.  Patient noted to have severe persistent tachypnea subsequently transferred to the ED.  CT chest done consistent with a multifocal pneumonia on the right with multinodular goiter deviating trachea to the right.  Patient with history of dementia.  MRSA PCR pending.  Sputum Gram stain and culture pending.  Blood cultures pending.  SARS coronavirus PCR negative.  Influenza A and B negative.  Continue IV vancomycin.  D/C IV cefepime as patient noted to have developed a rash while receiving cefepime per RN.  Place on IV aztreonam.  Supportive care.  2.  Anemia of chronic disease H&H stable.  3.  T11 compression fracture Supportive care.  4.  History of recent ischemic CVA/right PCA Currently stable.  Continue Eliquis for secondary stroke prophylaxis.  Continue statin, risk factor modification.  5.  Permanent atrial fibrillation Continue metoprolol and diltiazem  for rate control.  Eliquis for anticoagulation.  6.  Advanced dementia Patient being followed by hospice at memory care unit.  Patient was placed on hospice 08/15/2019.  Patient alert to self only,  wheelchair-bound.  Palliative care consulted have seen patient and recommended on discharge to [facility].  7.  Hyperthyroidism CT chest with multinodular goiter.  Continue Tapazole.  8.  Pseudohypocalcemia Corrected calcium of 9.28.   DVT prophylaxis: Eliquis Code Status: DNR Family Communication: Updated patient and daughter at bedside. Disposition:   Status is: Inpatient    Dispo: The patient is from: Memory care unit/[facility]              Anticipated d/c is to: Back to memory care unit/[facility] with hospice following.              Anticipated d/c date is: In 2 to 3 days.              Patient currently on IV antibiotics for pneumonia.        Consultants:   Palliative care: Dr. Linna DarnerAnwar 12/07/2019  Procedures:   CT chest 12/06/2019  Chest x-ray 12/06/2019    Antimicrobials:   IV aztreonam 12/07/2019  IV cefepime 12/06/2019>>>> 12/07/2019  IV vancomycin 12/06/2019>>>>    Subjective: Patient alert.  Pleasantly confused.  Denies chest pain or shortness of breath.  Events overnight noted.  Per RN patient with development of a rash on the torso while getting IV cefepime however improved after cefepime discontinued.  Objective: Vitals:   12/07/19 0400 12/07/19 0600 12/07/19 0644 12/07/19 1300  BP: (!) 132/91  128/88 (!) 142/94  Pulse:   (!) 109 74  Resp:   20 20  Temp:  98.2 F (36.8 C) 98.2 F (36.8 C) 99 F (37.2 C)  TempSrc:  Axillary Axillary Oral  SpO2:   96% 100%  Weight:      Height:        Intake/Output Summary (Last 24 hours) at 12/07/2019 1846 Last data filed at 12/07/2019 0224 Gross per 24 hour  Intake 860 ml  Output 150 ml  Net 710 ml   Filed Weights   12/06/19 0412  Weight: 43.1 kg    Examination:  General exam: Appears calm and comfortable  Respiratory system: Clear to auscultation anterior lung fields.  No wheezing.  Respiratory effort normal. Cardiovascular system: Irregularly irregular.  No JVD, no  murmurs rubs or gallops.  No lower extremity edema.   Gastrointestinal system: Abdomen is soft, nontender, nondistended, positive bowel sounds.  No rebound.  No guarding. Central nervous system: Alert. No focal neurological deficits.  Moving extremities spontaneously. Extremities: Symmetric 5 x 5 power. Skin: No rashes, lesions or ulcers Psychiatry: Judgement and insight appear poor. Mood & affect appropriate.     Data Reviewed: I have personally reviewed following labs and imaging studies  CBC: Recent Labs  Lab 12/06/19 0446 12/07/19 0534  WBC 12.5* 9.2  NEUTROABS 10.8*  --   HGB 10.5* 10.7*  HCT 34.4* 34.8*  MCV 94.5 92.6  PLT 154 179    Basic Metabolic Panel: Recent Labs  Lab 12/06/19 0446 12/06/19 1304 12/07/19 0534  NA 140  --  137  K 4.2  --  3.7  CL 107  --  105  CO2 24  --  26  GLUCOSE 125*  --  106*  BUN 31*  --  27*  CREATININE 0.90  --  0.91  CALCIUM 8.4*  --  8.4*  MG  --  2.0  --   PHOS  --  3.9  --     GFR: Estimated Creatinine Clearance: 31.3 mL/min (by C-G formula based on SCr of 0.91 mg/dL).  Liver Function Tests: Recent Labs  Lab 12/07/19 0534  AST 27  ALT 24  ALKPHOS 177*  BILITOT 1.6*  PROT 5.8*  ALBUMIN 2.9*    CBG: No results for input(s): GLUCAP in the last 168 hours.   Recent Results (from the past 240 hour(s))  Respiratory Panel by RT PCR (Flu A&B, Covid) - Nasopharyngeal Swab     Status: None   Collection Time: 12/06/19  7:18 AM   Specimen: Nasopharyngeal Swab  Result Value Ref Range Status   SARS Coronavirus 2 by RT PCR NEGATIVE NEGATIVE Final    Comment: (NOTE) SARS-CoV-2 target nucleic acids are NOT DETECTED. The SARS-CoV-2 RNA is generally detectable in upper respiratoy specimens during the acute phase of infection. The lowest concentration of SARS-CoV-2 viral copies this assay can detect is 131 copies/mL. A negative result does not preclude SARS-Cov-2 infection and should not be used as the sole basis for  treatment or other patient management decisions. A negative result may occur with  improper specimen collection/handling, submission of specimen other than nasopharyngeal swab, presence of viral mutation(s) within the areas targeted by this assay, and inadequate number of viral copies (<131 copies/mL). A negative result must be combined with clinical observations, patient history, and epidemiological information. The expected result is Negative. Fact Sheet for Patients:  PinkCheek.be Fact Sheet for Healthcare Providers:  GravelBags.it This test is not yet ap proved or cleared by the [place] FDA and  has been authorized for detection and/or diagnosis of SARS-CoV-2 by FDA under an Emergency Use Authorization (EUA). This EUA will remain  in effect (meaning this test can be used) for the duration of the COVID-19 declaration under Section 564(b)(1) of the Act, 21 U.S.C. section 360bbb-3(b)(1), unless the authorization is terminated or revoked sooner.    Influenza A by PCR NEGATIVE NEGATIVE Final   Influenza B by PCR NEGATIVE NEGATIVE Final    Comment: (NOTE) The Xpert Xpress SARS-CoV-2/FLU/RSV assay is intended as an aid in  the diagnosis of influenza from Nasopharyngeal swab specimens and  should not be used as a sole basis for treatment. Nasal washings and  aspirates are unacceptable for Xpert Xpress SARS-CoV-2/FLU/RSV  testing. Fact Sheet for Patients: PinkCheek.be Fact Sheet for Healthcare Providers: GravelBags.it This test is not yet approved or cleared by the [place] FDA and  has been authorized for detection and/or diagnosis of SARS-CoV-2 by  FDA under an Emergency Use Authorization (EUA). This EUA will remain  in effect (meaning this test can be used) for the duration of the  Covid-19 declaration under Section 564(b)(1) of the Act, 21  U.S.C. section  360bbb-3(b)(1), unless the authorization is  terminated or revoked. Performed at [facility], [place] [address] [place], [place] [address]   Blood culture (routine x 2)     Status: None (Preliminary result)   Collection Time: 12/06/19  9:30 AM   Specimen: BLOOD  Result Value Ref Range Status   Specimen Description   Final    BLOOD RIGHT ANTECUBITAL Performed at [place] [address], [place], [place] [address]    Special Requests   Final    BOTTLES DRAWN AEROBIC AND ANAEROBIC Blood Culture results may not be optimal due to an inadequate volume of blood received in culture bottles  Performed at [facility], [address] [place], [place] [address]    Culture   Final    NO GROWTH < 24 HOURS Performed at [facility], [address] [place], [place] [address]    Report Status PENDING  Incomplete  Blood culture (routine x 2)     Status: None (Preliminary result)   Collection Time: 12/06/19  9:35 AM   Specimen: BLOOD  Result Value Ref Range Status   Specimen Description   Final    BLOOD RIGHT ANTECUBITAL Performed at [facility], [address], [place], [place] [address]    Special Requests   Final    BOTTLES DRAWN AEROBIC AND ANAEROBIC Blood Culture results may not be optimal due to an inadequate volume of blood received in culture bottles Performed at [facility], [address], [place], [place] [address]    Culture   Final    NO GROWTH < 24 HOURS Performed at [facility], [address] [place], [place] [address]    Report Status PENDING  Incomplete         Radiology Studies: CT Chest Wo Contrast  Result Date: 12/06/2019 CLINICAL DATA:  Shortness of breath EXAM: CT CHEST WITHOUT CONTRAST TECHNIQUE: Multidetector CT imaging of the chest was performed following the standard protocol without IV contrast. COMPARISON:  01/02/2019 CTA chest FINDINGS:  Cardiovascular: Moderate cardiomegaly with calcific aortic and coronary artery atherosclerosis. No pericardial effusion. Mediastinum/Nodes: Multinodular goiter deviates the trachea to the right. Lungs/Pleura: Multifocal opacity in the right lung, predominantly in the right upper and lower lobes. Small pleural effusions. Upper Abdomen: Large hepatic cyst is incompletely visualized. Musculoskeletal: There is a new compression deformity at T11. IMPRESSION: 1. Multifocal opacity in the right lung, concerning for pneumonia. Small pleural effusions with basilar atelectasis. 2. New T11 compression deformity since 01/02/19, but otherwise age indeterminate. 3. Multinodular goiter deviates the trachea to the right. In the setting of significant comorbidities or limited life expectancy, no follow-up recommended (ref: J Am Coll Radiol. 2015 Feb;12(2): 143-50). 4. Aortic Atherosclerosis (ICD10-I70.0). Electronically Signed   By: Deatra Robinson M.D.   On: 12/06/2019 06:47   DG Chest Portable 1 View  Result Date: 12/06/2019 CLINICAL DATA:  Shortness of breath EXAM: PORTABLE CHEST 1 VIEW COMPARISON:  08/01/2019 FINDINGS: Mild cardiomegaly. No focal airspace consolidation. No pleural effusion or pneumothorax. No pulmonary edema. IMPRESSION: Mild cardiomegaly without focal airspace disease or pulmonary edema. Electronically Signed   By: Deatra Robinson M.D.   On: 12/06/2019 05:29        Scheduled Meds: . apixaban  2.5 mg Oral BID  . diltiazem  360 mg Oral Daily  . diltiazem  90 mg Oral Once  . feeding supplement (ENSURE ENLIVE)  237 mL Oral TID BM  . methimazole  5 mg Oral Daily  . metoprolol tartrate      . metoprolol tartrate  5 mg Intravenous Q8H  . rivastigmine  13.3 mg Transdermal Daily   Continuous Infusions: . sodium chloride 50 mL/hr at 12/07/19 1305  . aztreonam    . vancomycin       LOS: 1 day    Time spent: 40 minutes    Ramiro Harvest, MD [facility]   To contact the attending  provider between 7A-7P or the covering provider during after hours 7P-7A, please log into the web site [email or web address] and access using universal [place] password for that web site. If you do not  have the password, please call the hospital operator.  12/07/2019, 6:46 PM

## 2019-12-07 NOTE — Consult Note (Signed)
Consultation Note Date: 12/07/2019   Patient Name: Amy Randall  DOB: 1934/02/22  MRN: [id number]  Age / Sex: 86 y.o., female  PCP: Amy Du, MD Referring Physician: Eugenie Filler, MD  Reason for Consultation: Establishing goals of care  HPI/Patient Profile: 86 y.o. female  admitted on 12/06/2019   Clinical Assessment and Goals of Care: 86 year old lady with dementia atrial fibrillation hypertension hyperthyroidism ischemic stroke history.  Patient has been on hospice care since December 2020.  Has advanced dementia.  Patient presented with decompensated respiratory status persistent tachypnea and was admitted to hospital medicine service for healthcare associated pneumonia, hypoxic respiratory failure.  Patient has T11 compression fracture also had mild acute kidney injury.  History of recent ischemic cerebrovascular accident remains on anticoagulation with Eliquis also has permanent A. Fib.  Patient's baseline is such that she is alert to self but is wheelchair-bound.  She is currently at [facility].  She has hospice support.  Palliative consult for ongoing supportive care has been requested.  Amy Randall is awake alert resting in bed.  She verbalizes some.  She does not appear to be in distress.  Daughter present at bedside.  Introduced myself and palliative care as follows: Palliative medicine is specialized medical care for people living with serious illness. It focuses on providing relief from the symptoms and stress of a serious illness. The goal is to improve quality of life for both the patient and the family.  Goals of care: Broad aims of medical therapy in relation to the patient's values and preferences. Our aim is to provide medical care aimed at enabling patients to achieve the goals that matter most to them, given the circumstances of their particular medical situation and their  constraints.   We reviewed about the patient's current condition.  Decline trajectory from dementia standpoint discussed in detail.  Patient's daughter is thankful for hospice support at [facility].  For now, she wishes to continue with IV fluids and antibiotics.  We discussed about residential hospice.  For now, patient's daughter wishes to continue current mode of care and hopeful for patient transitioning back to [facility] with hospice support.  She will further discuss with her brother.  Offered active listening and supportive care.  HCPOA Daughter Amy Randall.  Patient also has a son.  Husband is deceased.  She lives at [facility].  She has had hospice services over at [facility].  SUMMARY OF RECOMMENDATIONS   Agree with DO NOT RESUSCITATE Continue current mode of care Appreciate hospice follow-up Discussed with daughter about next steps, goals of care.  Patient possibly to discharge back to [facility] with hospice following.  If she has acute precipitous decline or escalating symptom burden then at that point daughter will consider residential hospice, not at this point. Monitor p.o. intake Thank you for the consult. Code Status/Advance Care Planning:  DNR    Symptom Management:   As above  Palliative Prophylaxis:   Delirium Protocol     Psycho-social/Spiritual:   Desire for further  Chaplaincy support:yes  Additional Recommendations: Caregiving  Support/Resources  Prognosis:   < 3 months  Discharge Planning: Skilled Nursing Facility with Hospice      Primary Diagnoses: Present on Admission: . HCAP (healthcare-associated pneumonia)   I have reviewed the medical record, interviewed the patient and family, and examined the patient. The following aspects are pertinent.  Past Medical History:  Diagnosis Date  . Atrial fibrillation (HCC)   . Dementia (HCC)   . Hypertension    Social History   Socioeconomic History  . Marital status: Widowed      Spouse name: Not on file  . Number of children: Not on file  . Years of education: Not on file  . Highest education level: Not on file  Occupational History  . Not on file  Tobacco Use  . Smoking status: Never Smoker  . Smokeless tobacco: Never Used  Substance and Sexual Activity  . Alcohol use: Not on file  . Drug use: Not on file  . Sexual activity: Not on file  Other Topics Concern  . Not on file  Social History Narrative  . Not on file   Social Determinants of Health   Financial Resource Strain:   . Difficulty of Paying Living Expenses:   Food Insecurity:   . Worried About Programme researcher, broadcasting/film/video in the Last Year:   . Barista in the Last Year:   Transportation Needs:   . Freight forwarder (Medical):   Marland Kitchen Lack of Transportation (Non-Medical):   Physical Activity:   . Days of Exercise per Week:   . Minutes of Exercise per Session:   Stress:   . Feeling of Stress :   Social Connections:   . Frequency of Communication with Friends and Family:   . Frequency of Social Gatherings with Friends and Family:   . Attends Religious Services:   . Active Member of Clubs or Organizations:   . Attends Banker Meetings:   Marland Kitchen Marital Status:    Family History  Problem Relation Age of Onset  . Hypertension Father   . Stroke Father   . Diabetes Maternal Grandmother    Scheduled Meds: . apixaban  2.5 mg Oral BID  . atorvastatin  20 mg Oral q1800  . diltiazem  360 mg Oral Daily  . diltiazem  90 mg Oral Once  . feeding supplement (ENSURE ENLIVE)  237 mL Oral TID BM  . methimazole  5 mg Oral Daily  . metoprolol tartrate  25 mg Oral BID  . rivastigmine  13.3 mg Transdermal Daily   Continuous Infusions: . ceFEPime (MAXIPIME) IV 2 g (12/06/19 2114)  . vancomycin     PRN Meds:.enalaprilat, loperamide, LORazepam Medications Prior to Admission:  Prior to Admission medications   Medication Sig Start Date End Date Taking? Authorizing Provider  acetaminophen  (TYLENOL) 325 MG tablet Take 650 mg by mouth 3 (three) times daily.    Yes [provider]  apixaban (ELIQUIS) 2.5 MG TABS tablet Take 1 tablet (2.5 mg total) by mouth 2 (two) times daily. 08/07/19  Yes Berton Mount I, MD  atorvastatin (LIPITOR) 20 MG tablet Take 20 mg by mouth daily. 11/01/19  Yes [provider]  diltiazem (CARDIZEM CD) 360 MG 24 hr capsule Take 1 capsule (360 mg total) by mouth daily. 01/04/19  Yes [name], MD  Ensure (ENSURE) Take 237 mLs by mouth 3 (three) times daily between meals.   Yes [provider]  loperamide (IMODIUM A-D)  2 MG tablet Take 2-4 mg by mouth 4 (four) times daily as needed for diarrhea or loose stools.   Yes [provider]  LORazepam (ATIVAN) 0.5 MG tablet Take 0.25 mg by mouth every 6 (six) hours as needed for anxiety (agitation).   Yes [provider]  methimazole (TAPAZOLE) 5 MG tablet Take 5 mg by mouth daily. 06/04/19  Yes [provider]  metoprolol tartrate (LOPRESSOR) 25 MG tablet Take 1 tablet (25 mg total) by mouth 2 (two) times daily. 01/04/19  Yes [name], MD  rivastigmine (EXELON) 13.3 MG/24HR Place 13.3 mg onto the skin daily. 06/10/19  Yes [provider]   Allergies  Allergen Reactions  . Aricept [Donepezil]     Per mar  . Bactrim [Sulfamethoxazole-Trimethoprim] Other (See Comments)    Loss of memory  . Ibuprofen Other (See Comments)    Causes bruising  . Orange Fruit [Citrus]     Per patient's daughter, orange snesitivity  . Penicillins Rash    Has patient had a PCN reaction causing immediate rash, facial/tongue/throat swelling, SOB or lightheadedness with hypotension: Yes Has patient had a PCN reaction causing severe rash involving mucus membranes or skin necrosis: No Has patient had a PCN reaction that required hospitalization: No Has patient had a PCN reaction occurring within the last 10 years: No If all of the above answers are "NO", then may  proceed with Cephalosporin use.    Review of Systems Verbalizes some.   Physical Exam Patient is a frail elderly lady resting in bed Patient has senile purpuric spots both upper extremities, she is on Eliquis Appears to have regular work of breathing Abdomen is not distended She is frail and cachectic No edema Awake reasonably alert does not verbalize much has baseline dementia  Vital Signs: BP 128/88 (BP Location: Left Arm)   Pulse (!) 109   Temp 98.2 F (36.8 C) (Axillary)   Resp 20   Ht 5\' 1"  (1.549 m)   Wt 43.1 kg   SpO2 96%   BMI 17.95 kg/m  Pain Scale: 0-10   Pain Score: 0-No pain   SpO2: SpO2: 96 % O2 Device:SpO2: 96 % O2 Flow Rate: .O2 Flow Rate (L/min): 2 L/min  IO: Intake/output summary:   Intake/Output Summary (Last 24 hours) at 12/07/2019 1158 Last data filed at 12/07/2019 0224 Gross per 24 hour  Intake 1142.5 ml  Output 150 ml  Net 992.5 ml    LBM:   Baseline Weight: Weight: 43.1 kg Most recent weight: Weight: 43.1 kg     Palliative Assessment/Data:   PPS 30%  Time In:  9 Time Out:10.10   Time Total:  70  Greater than 50%  of this time was spent counseling and coordinating care related to the above assessment and plan.  Signed by: 02/06/2020, MD   Please contact Palliative Medicine Team phone at [phone] for questions and concerns.  For individual provider: See [phone]

## 2019-12-07 NOTE — Progress Notes (Signed)
This shift pt's nurse came to writer regarding pts  BP 156/100. Pt has no PRN's at this time and contacted attending, [place]. Nurse has been waiting for response so Clinical research associate, Press photographer, text paged attending. Received reply stating no new orders at this time. Family member concerned and [facility] Joe contacted regarding BP concerns. Awaiting response, will continue to monitor

## 2019-12-07 NOTE — Progress Notes (Addendum)
Patient's BP is currently 156/100, taken with Dinamap and taken manually. Order to notify MD if patient's DBP is greater than 90. Notified MD on call Bruna Potter) to attempt to get PRN order for BP, patient has history of HTN and Afib and has been in A fib since admission after an hour no response. Charge nurse notified of my concerns and charge nurse paged MD on call, order was placed to follow care instructions, no new medication orders given. Patient's daughter contacted [facility] ([facility]) and discussed her concerns, AC then call to speak with me, I informed him of situation and he contacted MD on call, [facility] informed me that MD on call informed him that she was on call for emergencies only and did not give any new orders. Contacted attending on call, informed him of patient's status, attending MD placed PRN order for Vasotec to be given if patient's SBP is greater than 180 and Diastolic greater than 110. Rechecked patient's BP, BP is now 168/117. Attending informed of patient's BP, new orders placed, medication administered(see MAR) patient is being monitored and vitals will be rechecked. Marcelle Overlie, RN  Update: BP is now 132/97 @ [phone]

## 2019-12-07 NOTE — Progress Notes (Signed)
Initial Nutrition Assessment  RD working remotely.   DOCUMENTATION CODES:   Underweight  INTERVENTION:  - continue Ensure Enlive TID, each supplement provides 350 kcal and 20 grams of protein. - will order Magic Cup with lunch meals, each supplement provides 290 kcal and 9 grams of protein. - will complete NFPE at follow-up.    NUTRITION DIAGNOSIS:   Increased nutrient needs related to acute illness as evidenced by estimated needs.  GOAL:   Patient will meet greater than or equal to 90% of their needs  MONITOR:   PO intake, Supplement acceptance, Labs, Weight trends  REASON FOR ASSESSMENT:   Malnutrition Screening Tool   ASSESSMENT:   86 y.o. female with medical history of dementia, afib, HTN, hyperthyroidism, ischemic stroke, R-sided PCA. She is currently enrolled in hospice d/t advanced dementia. She presented to the ED due to increased WOB. Notes indicate that she is wheelchair bound.  Patient noted to be a/o to self only. Ensure Enlive was ordered TID and she accepted bottle offered to her this AM. No intakes documented since admission. Patient was last seen by a [place] RD on 12/14/18.  Per chart review, weight yesterday in the ED was recorded as 95 lb and PTA, the most recently documented weight was on 01/02/19 when she weighed 100 lb. This indicates 5 lb weight loss (5% body weight) in the past 1 year; not significant for time frame.  She was assessed by SLP yesterday afternoon and recommendation was made for Dysphagia 3, thin liquids (current diet order).  Palliative Care talked with patient's daughter earlier today. Patient is from a facility with hospice following. Residential hospice was discussed but plan is for return to the facility with hospice follow-up. Dr. Inda Castle note states that prognosis is <3 months.    Labs reviewed; BUN: 27 mg/dl, Ca: 8.4 mg/dl, alk phos elevated. Medications reviewed;  IVF; NS @ 50 ml/hr.     NUTRITION - FOCUSED PHYSICAL  EXAM:  unable to complete at this time.   Diet Order:   Diet Order            DIET DYS 3 Room service appropriate? Yes; Fluid consistency: Thin  Diet effective now              EDUCATION NEEDS:   No education needs have been identified at this time  Skin:  Skin Assessment: Reviewed RN Assessment  Last BM:  PTA/unknown  Height:   Ht Readings from Last 1 Encounters:  12/06/19 _0  (1.549 m)    Weight:   Wt Readings from Last 1 Encounters:  12/06/19 43.1 kg    Estimated Nutritional Needs:  Kcal:  1300-1500 kcal Protein:  60-70 grams Fluid:  >/= 1.5 L/day     Jarome Matin, MS, RD, LDN, CNSC Inpatient Clinical Dietitian RD pager # available in [facility]  After hours/weekend pager # available in [facility]

## 2019-12-07 NOTE — Progress Notes (Signed)
[facility] 1516- [facility] Hospitalized hospice patient visit.  Amy Randall, current hospice patient with terminal diagnosis of cerebrovascular disease. Patient had a change in respiratory status and was wheezing. Hospice on call nurse was notified and she consulted with the hospice physcian who advised the patient go to the ED for evaluation and treatment. Patient was admitted to [facility] on 12/06/2019 with a diagnosis of HCAP. Per Dr. Kern Reap this is a related hospital admission.  Visited patient at bedside with daughter present. Patient seems more agitated and restless today. During visit, it was noted patient was developing a rash on abdomen and sides. Cefepime was discontinued. Daughter also voiced concerns that there were not orders for her mother's routine cardiac medication ordered. She became tachycardic and hypertensive as a result. The on DM refused to order the meds. Daughter contacted [facility] hospital administrator on call to address this situation. The patient is now receiving her cardiac medications.   VS: 99.0, 142/94, 74, 20, 100% on 2Lnc I/O: 1142.5/150  IV and continuous medications: NS@50ml /hr, Azactam 1gm IVPB TID, Vancomycin 500mg  IVPB QD, tylenol 650mg  PRN @ 1312  Per MD notes: Palliative care team note: Agree with DO NOT RESUSCITATE Continue current mode of care Appreciate hospice follow-up Discussed with daughter about next steps, goals of care.  Patient possibly to discharge back to [facility] with hospice following.  If she has acute precipitous decline or escalating symptom burden then at that point daughter will consider residential hospice, not at this point. Monitor p.o. intake  Discharge Planning- ongoing. Daughter does not want to pursue [facility] residential hospice unless the patient has greater decline or needs escalating symptom management.   Family contact: as noted above.  GOC: DNR. Treat symptoms, provide comfort care.    Should Patient need ambulance transport at discharge please use [facility] as they contract this service for our active hospice patients.   Please call with any hospice related questions.  [facility], RN, CCM [facility] Liaison [phone]

## 2019-12-07 NOTE — Progress Notes (Signed)
MD on call paged concerning patient's daily medications that consist of Cardizem and Metoprolol. Daughter was also concerned that patient was not getting her regular daily scheduled medications. Notified doctor, MD on call Bruna Potter) and was informed that patient's attending MD would see her in the AM and go over her medications and add medications needed. Informed daughter of conversation. Will continue to monitor patient, Amy Overlie, RN

## 2019-12-08 DIAGNOSIS — R451 Restlessness and agitation: Secondary | ICD-10-CM

## 2019-12-08 LAB — BASIC METABOLIC PANEL
Anion gap: 9 (ref 5–15)
BUN: 25 mg/dL — ABNORMAL HIGH (ref 8–23)
CO2: 21 mmol/L — ABNORMAL LOW (ref 22–32)
Calcium: 8.3 mg/dL — ABNORMAL LOW (ref 8.9–10.3)
Chloride: 110 mmol/L (ref 98–111)
Creatinine, Ser: 0.69 mg/dL (ref 0.44–1.00)
GFR calc Af Amer: >60 mL/min (ref >60)
GFR calc non Af Amer: >60 mL/min (ref >60)
Glucose, Bld: 94 mg/dL (ref 70–99)
Potassium: 3.5 mmol/L (ref 3.5–5.1)
Sodium: 140 mmol/L (ref 135–145)

## 2019-12-08 LAB — CBC WITH DIFFERENTIAL/PLATELET
Abs Immature Granulocytes: 0.06 10*3/uL (ref 0.00–0.07)
Basophils Absolute: 0.1 10*3/uL (ref 0.0–0.1)
Basophils Relative: 1 %
Eosinophils Absolute: 0.4 10*3/uL (ref 0.0–0.5)
Eosinophils Relative: 4 %
HCT: 35.9 % — ABNORMAL LOW (ref 36.0–46.0)
Hemoglobin: 10.8 g/dL — ABNORMAL LOW (ref 12.0–15.0)
Immature Granulocytes: 1 %
Lymphocytes Relative: 7 %
Lymphs Abs: 0.6 10*3/uL — ABNORMAL LOW (ref 0.7–4.0)
MCH: 28.6 pg (ref 26.0–34.0)
MCHC: 30.1 g/dL (ref 30.0–36.0)
MCV: 95 fL (ref 80.0–100.0)
Monocytes Absolute: 0.5 10*3/uL (ref 0.1–1.0)
Monocytes Relative: 6 %
Neutro Abs: 7.3 10*3/uL (ref 1.7–7.7)
Neutrophils Relative %: 81 %
Platelets: 181 10*3/uL (ref 150–400)
RBC: 3.78 MIL/uL — ABNORMAL LOW (ref 3.87–5.11)
RDW: 16.4 % — ABNORMAL HIGH (ref 11.5–15.5)
WBC: 9 10*3/uL (ref 4.0–10.5)
nRBC: 0 % (ref 0.0–0.2)

## 2019-12-08 LAB — STREP PNEUMONIAE URINARY ANTIGEN: Strep Pneumo Urinary Antigen: NEGATIVE

## 2019-12-08 LAB — MRSA PCR SCREENING: MRSA by PCR: NEGATIVE

## 2019-12-08 MED ORDER — POTASSIUM CHLORIDE 20 MEQ PO PACK
40.0000 meq | PACK | Freq: Once | ORAL | Status: AC
  Administered 2019-12-08: 40 meq via ORAL
  Filled 2019-12-08: qty 2

## 2019-12-08 MED ORDER — IPRATROPIUM-ALBUTEROL 0.5-2.5 (3) MG/3ML IN SOLN
3.0000 mL | Freq: Four times a day (QID) | RESPIRATORY_TRACT | Status: DC | PRN
  Administered 2019-12-08 – 2019-12-09 (×5): 3 mL via RESPIRATORY_TRACT
  Filled 2019-12-08 (×5): qty 3

## 2019-12-08 MED ORDER — METOPROLOL TARTRATE 5 MG/5ML IV SOLN
5.0000 mg | Freq: Three times a day (TID) | INTRAVENOUS | Status: DC
  Administered 2019-12-08 – 2019-12-09 (×4): 5 mg via INTRAVENOUS
  Filled 2019-12-08 (×3): qty 5

## 2019-12-08 MED ORDER — OLANZAPINE 5 MG PO TBDP
2.5000 mg | ORAL_TABLET | Freq: Every evening | ORAL | Status: DC | PRN
  Filled 2019-12-08: qty 0.5

## 2019-12-08 MED ORDER — SODIUM CHLORIDE 0.9 % IV SOLN
2.0000 g | Freq: Three times a day (TID) | INTRAVENOUS | Status: DC
  Administered 2019-12-08 – 2019-12-09 (×4): 2 g via INTRAVENOUS
  Filled 2019-12-08 (×6): qty 2

## 2019-12-08 NOTE — TOC Initial Note (Addendum)
Transition of Care [facility]) - Initial/Assessment Note    Patient Details  Name: Amy Randall MRN: [id number] Date of Birth: February 11, 1934  Transition of [facility]) CM/SW Contact:    Ida Rogue, LCSW Phone Number: 12/08/2019, 12:53 PM  Clinical Narrative:  [facility] to discuss patient.  Rosine Abe confirmed that she is a memory care patient, and that she is also being served by [facility] for hospice services.  At d/c, they need a d/c summary faxed to [phone] and a COVID test within 48 hours of d/c. TOC will continue to follow during the course of hospitalization.                  Expected Discharge Plan: Assisted Living Barriers to Discharge: No Barriers Identified   Patient Goals and CMS Choice        Expected Discharge Plan and Services Expected Discharge Plan: Assisted Living                                              Prior Living Arrangements/Services                       Activities of Daily Living Home Assistive Devices/Equipment: None ADL Screening (condition at time of admission) Patient's cognitive ability adequate to safely complete daily activities?: No Is the patient deaf or have difficulty hearing?: Yes Does the patient have difficulty seeing, even when wearing glasses/contacts?: Yes Does the patient have difficulty concentrating, remembering, or making decisions?: Yes Patient able to express need for assistance with ADLs?: No Does the patient have difficulty dressing or bathing?: Yes Independently performs ADLs?: No Communication: Dependent Is this a change from baseline?: Pre-admission baseline Dressing (OT): Dependent Does the patient have difficulty walking or climbing stairs?: Yes Weakness of Legs: Both Weakness of Arms/Hands: Both  Permission Sought/Granted                  Emotional Assessment              Admission diagnosis:  Hypoxia [R09.02] HCAP (healthcare-associated pneumonia)  [J18.9] Multifocal pneumonia [J18.9] Patient Active Problem List   Diagnosis Date Noted  . Multifocal pneumonia   . Hyperthyroidism   . Permanent atrial fibrillation (HCC)   . Advanced dementia (HCC)   . History of CVA (cerebrovascular accident)   . Anemia of chronic disease   . HCAP (healthcare-associated pneumonia) 12/06/2019  . Stroke (HCC) 08/01/2019  . Tachycardia 01/02/2019  . Acute CHF (congestive heart failure) (HCC) 01/02/2019  . Hip fracture (HCC) 12/14/2018  . Essential hypertension 12/14/2018  . Dementia without behavioral disturbance (HCC) 12/14/2018  . AF (paroxysmal atrial fibrillation) (HCC) 12/14/2018  . Hyperglycemia 12/14/2018   PCP:  [name], MD Pharmacy:   [facility] [address] [place] - [address] [place] [address] Phone: [phone] Fax: [phone]  [facility] [place], [place] - [address] [place] [address] Phone: [phone] Fax: [phone]  [facility] - [place], [place] - [address] [place] [address] Phone: [phone] Fax: [phone]     Social Determinants of Health (SDOH) Interventions    Readmission Risk Interventions Readmission Risk Prevention Plan 01/04/2019  Transportation Screening Complete  PCP or Specialist Appt within 5-7  Days Complete  Home Care Screening Complete  Medication Review (RN CM) Complete  Some recent data might be hidden

## 2019-12-08 NOTE — Plan of Care (Signed)
  Problem: Clinical Measurements: Goal: Will remain free from infection Outcome: Progressing   Problem: Clinical Measurements: Goal: Respiratory complications will improve Outcome: Progressing   Problem: Education: Goal: Knowledge of General Education information will improve Description: Including pain rating scale, medication(s)/side effects and non-pharmacologic comfort measures Outcome: Progressing   

## 2019-12-08 NOTE — Progress Notes (Signed)
[facility]16- [facility]) Hospitalized Hospice Patient  Amy Randall is under hospice services with a terminal diagnosis of cerebrovascular disease.  On 5/1, pt was noted to be SOB and wheezing at facility.  Facility contacted hospice and decision was made to send her to ED for further evaluation.  She is admitted with community acquired pneumonia.  This is a related admission per Dr. Barbee Shropshire with [facility].  Report exchanged, visited pt and daughter at the bedside.  MD rounding advised will likely d/c back to facility on 5/4 once she is finished with her IV antibiotics.  Pt asleep and does not have any needs that are unmet by the hospital staff.  V/S:  97.9 oral, 146/81, HR 80, RR 20, SPO2 100% on RA I&O:  Not recorded, noted concentrated urine in purewick suction cannister Abnormal labs:  RBC 3.78, hgb 10.8, HCT 35.9, blood cx showing no growth IVs/PRNs:  Lopressor 5 mg IV TID, NS @ 50 ml/hr (now stopped), azactam 2g IV TID, ativan 0.25 mg IV x 1 Diagnostics:  CXR on 5/1- Multifocal opacity in the right lung  Problem List -CAP- antibiotics as above, afebrile, blood cx showing no growth -respiratory failure - on RA, SPO2 100%  GOC:  Clear, DNR, return to facility once medically optimized D/C planning:  Return to facility with hospice support Family:  Updated at the bedside IDT:  Updated  Once she is ready to d/c, please use [facility] for transport.  They contract this service for our active hospice patients.  Wallis Bamberg RN, BSN, CCRN [facility] Liaison (in [place]) [phone]

## 2019-12-08 NOTE — Progress Notes (Signed)
Daily Progress Note   Patient Name: Amy Randall       Date: 12/08/2019 DOB: [birth year]  Age: 86 y.o. MRN#: [id number] Attending Physician: Rodolph Bong, MD Primary Care Physician: [name], MD Admit Date: 12/06/2019  Reason for Consultation/Follow-up: Establishing goals of care  Subjective:    Length of Stay: 2  Current Medications: Scheduled Meds:  . apixaban  2.5 mg Oral BID  . diltiazem  360 mg Oral Daily  . diltiazem  90 mg Oral Once  . feeding supplement (ENSURE ENLIVE)  237 mL Oral TID BM  . methimazole  5 mg Oral Daily  . metoprolol tartrate  5 mg Intravenous Q8H  . rivastigmine  13.3 mg Transdermal Daily    Continuous Infusions: . aztreonam      PRN Meds: acetaminophen, acetaminophen, enalaprilat, hydrALAZINE, ipratropium-albuterol, loperamide, LORazepam, ondansetron (ZOFRAN) IV  Physical Exam          Vital Signs: BP 125/77 (BP Location: Left Arm)   Pulse 78   Temp 97.8 F (36.6 C) (Oral)   Resp 20   Ht 5\' 1"  (1.549 m)   Wt 43.1 kg   SpO2 98%   BMI 17.95 kg/m  SpO2: SpO2: 98 % O2 Device: O2 Device: Room Air O2 Flow Rate: O2 Flow Rate (L/min): 2 L/min  Intake/output summary:   Intake/Output Summary (Last 24 hours) at 12/08/2019 1153 Last data filed at 12/08/2019 02/07/2020 Gross per 24 hour  Intake 200 ml  Output 150 ml  Net 50 ml   LBM: Last BM Date: 12/07/19 Baseline Weight: Weight: 43.1 kg Most recent weight: Weight: 43.1 kg       Palliative Assessment/Data:      Patient Active Problem List   Diagnosis Date Noted  . Multifocal pneumonia   . Hyperthyroidism   . Permanent atrial fibrillation (HCC)   . Advanced dementia (HCC)   . History of CVA (cerebrovascular accident)   . Anemia of chronic disease   . HCAP  (healthcare-associated pneumonia) 12/06/2019  . Stroke (HCC) 08/01/2019  . Tachycardia 01/02/2019  . Acute CHF (congestive heart failure) (HCC) 01/02/2019  . Hip fracture (HCC) 12/14/2018  . Essential hypertension 12/14/2018  . Dementia without behavioral disturbance (HCC) 12/14/2018  . AF (paroxysmal atrial fibrillation) (HCC) 12/14/2018  . Hyperglycemia 12/14/2018    Palliative Care Assessment &  Plan   Patient Profile:  86 year old lady with dementia atrial fibrillation hypertension hyperthyroidism ischemic stroke history.  Patient has been on hospice care since December 2020.  Has advanced dementia.  Patient presented with decompensated respiratory status persistent tachypnea and was admitted to hospital medicine service for healthcare associated pneumonia, hypoxic respiratory failure.  Patient has T11 compression fracture also had mild acute kidney injury.  History of recent ischemic cerebrovascular accident remains on anticoagulation with Eliquis also has permanent A. Fib.  Patient's baseline is such that she is alert to self but is wheelchair-bound.  She is currently at [facility].  She has hospice support.  Palliative consult for ongoing supportive care has been requested.   Assessment:  acute hypoxic resp failure secondary to HCAP versus component of aspiration PNA. A fib.  Episode of volume overload last night, IVF reduced and patient given breathing treatments.  Restless ness and agitation at times.  Minimal to moderate PO intake.   Recommendations/Plan:   ongoing goals of care discussions with patient's daughter regarding the trajectory of decline from the patient's serious illness of dementia and also her heart conditions was undertaken. Reviewed about end of life signs and symptoms. Discussed about differences between [facility] with hospice versus the type of care that can be provided in a residential hospice setting.   Continue Tylenol for pain,continue PRN low  dose Ativan for anxiety.   PMT to follow.    Code Status:    Code Status Orders  (From admission, onward)         Start     Ordered   12/06/19 1031  Do not attempt resuscitation (DNR)  Continuous    Question Answer Comment  In the event of cardiac or respiratory ARREST Do not call a "code blue"   In the event of cardiac or respiratory ARREST Do not perform Intubation, CPR, defibrillation or ACLS   In the event of cardiac or respiratory ARREST Use medication by any route, position, wound care, and other measures to relive pain and suffering. May use oxygen, suction and manual treatment of airway obstruction as needed for comfort.   Comments see MOST FORM      12/06/19 1036        Code Status History    Date Active Date Inactive Code Status Order ID Comments User Context   12/06/2019 0622 12/06/2019 1036 DNR [id number]  Ward, Layla Maw, DO ED   08/04/2019 1255 08/08/2019 2312 DNR [id number]  Edsel Petrin, DO Inpatient   08/01/2019 1707 08/04/2019 1255 Full Code [id number]  Lanae Boast, MD ED   01/02/2019 1805 01/04/2019 1638 Full Code [id number]  Shon Hale, MD Inpatient   12/14/2018 0952 12/17/2018 2051 Full Code [id number]  Jonah Blue, MD ED   Advance Care Planning Activity    Advance Directive Documentation     Most Recent Value  Type of Advance Directive  Healthcare Power of Attorney  Pre-existing out of facility DNR order (yellow form or pink MOST form)  -  "MOST" Form in Place?  -       Prognosis:   guarded.  Discharge Planning:  To Be Determined  Care plan was discussed with SLP, daughter [name].   Thank you for allowing the Palliative Medicine Team to assist in the care of this patient.   Time In:   11 Time Out: 11.25 Total Time 25 Prolonged Time Billed  no       Greater than 50%  of this time was spent counseling  and coordinating care related to the above assessment and plan.  Loistine Chance, MD  Please contact Palliative Medicine Team phone at  [phone] for questions and concerns.

## 2019-12-08 NOTE — Progress Notes (Signed)
PHARMACY NOTE:  ANTIMICROBIAL RENAL DOSAGE ADJUSTMENT  Current antimicrobial regimen includes a mismatch between antimicrobial dosage and estimated renal function.  As per policy approved by the Pharmacy & Therapeutics and Medical Executive Committees, the antimicrobial dosage will be adjusted accordingly.  Current antimicrobial dosage:  Aztreonam 1 g IV q8h  Indication: PNA  Renal Function:  Estimated Creatinine Clearance: 35.6 mL/min (by C-G formula based on SCr of 0.69 mg/dL).    Antimicrobial dosage has been changed to:  Aztreonam 2 g IV q8h  Thank you for allowing pharmacy to be a part of this patient's care.  Cindi Carbon, [facility] 12/08/2019 9:37 AM

## 2019-12-08 NOTE — Progress Notes (Signed)
Speech Language Pathology Treatment: Dysphagia  Patient Details Name: Amy Randall MRN: [id number] DOB: 1934-07-21 Today's Date: 12/08/2019 Time: 2707-8675 SLP Time Calculation (min) (ACUTE ONLY): 22 min  Assessment / Plan / Recommendation Clinical Impression  Ms Amy Randall, 86y/f, presented to ED from SNF (Hospice/memory care) with tachypnea and intermittent congestion. PMH of dementia, A-fib, hypertension, hyperthyroidism, Ischemic stroke righ PCA 12/20. Patient seen by ST for swallowing after stroke 12/20. At that time she presented with mild oropharyngeal dysphagia likely related to dementia. At that time she required Dys 2, thin liquid diet.  Note per palliative notes, pt prognosis is less than 3 months.  Follow up to educate to swallow precautions and review concept of comfort intake  Discussion with daughter re: pt's premorbid status including minimal fluid intake her entire life (sounds as if intake was approx 20 ounce max daily) which may then increase dehydration risk if thickened drinks are used.  Reviewed other factors that contribute to aspiration pna risk including work of breathing and mobility status.  Daughter states she has noted when she visits the pt, coughing on occasion with thin sodas due to use of straw and consumption of large bolus.  Daughter states slow rate of consumption will prevent coughing.  Also pt benefits from repositioning when daughter hears congestion at the "back of her mouth" - suspect repositioning pushes fluid/secretions off vocal cords.  Option of MBS reviewed but given this is the patient's first pna, daughter is agreeable to forgoing MBS at this time.  Pt was not adequately alert for po trials and was lowered in bed, thus no observation conducted.  However daughter appears to have good understanding of dysphagia.    HPI HPI: Ms Amy Randall, 86y/f, presented to ED from SNF [facility] care) with tachypnea and intermittent congestion. PMH of  dementia, A-fib, hypertension, hyperthyroidism, Ischemic stroke righ PCA 12/20. Patient seen by ST for swallowing after stroke 12/20. At that time she presented with mild oropharyngeal dysphagia likely related to dementia. At that time she required Dys 2, thin liquid diet.  Note per palliative notes, pt prognosis is less than 3 months.  Follow up to educate to swallow precautions and review concept of comfort intake  Discussion with daughter re: pt's premorbid status including minimal fluid intake her entire life (sounds as if intake was approx 20 ounce max daily) which may then increase dehydration risk if thickened drinks are used.  Reviewed other factors that contribute to aspiration pna risk including work of breathing and mobility status.  Daughter states she has noted when she visits the pt, coughing on occasion with thin sodas due to use of straw and consumption of large bolus.  Daughter states slow rate of consumption will prevent coughing.  Also pt benefits from repositioning when daughter hears congestion at the "back of her mouth" - suspect repositioning pushes fluid/secretions off vocal cords.  Option of MBS reviewed but given this is the patient's first pna, daughter is agreeable to forgoing MBS at this time.  Pt was not adequately alert for po trials and was lowered in bed, thus no observation conducted.  However daughter appears to have good understanding of dysphagia.      SLP Plan  All goals met       Recommendations  Diet recommendations: Dysphagia 3 (mechanical soft);Nectar-thick liquid(water protocol) Medication Administration: Whole meds with puree Supervision: Patient able to self feed Compensations: Minimize environmental distractions;Slow rate;Small sips/bites Postural Changes and/or Swallow Maneuvers: Seated upright 90 degrees;Upright 30-60 min after meal  Oral Care Recommendations: Oral care BID Follow up Recommendations: Skilled Nursing facility SLP  Visit Diagnosis: Dysphagia, oropharyngeal phase (R13.12) Plan: All goals met       GO                Macario Golds 12/08/2019, 10:51 AM  Kathleen Lime, MS [place] Office [phone]

## 2019-12-08 NOTE — Progress Notes (Signed)
Noted pt to have more labored breathing and more tachypneic than during my previous hourly round and shift assessment. Pt with bilateral expiratory wheezing and crackles. O2 94% on room air. Pt then changed after incontinent episode and obviously became more labored while flat during linen change. Somewhat improved breath sounds after patient coughed and HOB increased. Provider made aware and orders received to d/c IVF. Will continue to monitor.

## 2019-12-08 NOTE — Progress Notes (Addendum)
PROGRESS NOTE    Amy Randall  ZOX:[id number] DOB: 1933-09-14 DOA: 12/06/2019 PCP: Sinda Du, MD    Chief Complaint  Patient presents with  . Respiratory Distress    Brief Narrative:  HPI per Dr. Rea College KHELANI KOPS is a 86 y.o. female with medical history significant of  Dementia,atrial fibrillation, hypertension,hyperthyroidism, ischemic stroke right PCA, 12/20 on hospice due to advanced dementia who presents to ED with increase work of breathing. Of note patient is a poor historian and history is taken from ED Chart, hospice RN and patient Daughter Arcelia Jew. Per daughter at baseline patient is intermittently alert to self only, follows simple commands, and ambulates with a wheel chair. Per daughter patient has had increase difficulty with breathing over the last 24 hours. She was evaluated by hospice RN earlier in the day and was noted to have increase work of breathing that was intermittent and would resolve. At that time patient was noted to have stable vitals and saturation at NH so no intervention was employed at that time. Unfortunately patient respiratory status decompensated with noted severe persistent tachypnea and patient was transferred to ED early am of 12/06/19 for stabilization and symptom management. Per daughter patient did not have a fever but she was noted to be intermittently congested.  ED Course: IN ed Initial vitals: 98.4, BP142/82  Hr 77,  rr 40 on 100% nrb sat 100% Room air sat lowest 87% patient transitioned to [place] 2L and later transitioned to ra with sat 94%  Labs: wbc 12.5, increase pmn 86, hgb 10.5 down from 12.4 Cr 0.9,bun 31 BNP 996 / 12/20 echo ef 60-655 no mention of diastolic dysfunction  WJ:19, EKG : afib  Poor quality , low voltage  Repeat pending Respiratory panel pending Abg:7.5/32/po2 54/sat 88 CT  [place]:  1. Multifocal opacity in the right lung, concerning for pneumonia. Small pleural effusions with basilar atelectasis. 2.  New T11 compression deformity since 01/02/19, but otherwise age indeterminate. 3. Multinodular goiter deviates the trachea to the right. In the setting of significant comorbidities or limited life expectancy, no follow-up recommended (ref: J Am Coll Radiol. 2015 Feb;12(2): 143-50). 4. Aortic Atherosclerosis (ICD10-I70.0). TX:  With nebs, Lasix 40 mg  Assessment & Plan:   Active Problems:   HCAP (healthcare-associated pneumonia)   Multifocal pneumonia   Hyperthyroidism   Permanent atrial fibrillation (HCC)   Advanced dementia ([place])   History of CVA (cerebrovascular accident)   Anemia of chronic disease   1 acute hypoxic respiratory failure secondary to HCAP versus aspiration pneumonia Patient admitted acute hypoxic respiratory failure with tachypnea, worsening confusion, increasing shortness of breath and increased work of breathing.  Patient noted to have severe persistent tachypnea subsequently transferred to the ED.  CT chest done consistent with a multifocal pneumonia on the right with multinodular goiter deviating trachea to the right.  Patient with history of dementia.  MRSA PCR negative.  Sputum Gram stain and culture pending.  Blood cultures pending.  SARS coronavirus PCR negative.  Influenza A and B negative. D/C IV vancomycin.  IV cefepime has been discontinued due to development of a rash per RN when antibiotic was given.  Continue IV aztreonam.  Supportive care.   2.  Anemia of chronic disease H&H stable.  3.  T11 compression fracture Supportive care.  4.  History of recent ischemic CVA/right PCA, POA Continue Eliquis for secondary stroke prophylaxis.  Continue statin, risk factor modification.  5.  Permanent atrial fibrillation Metoprolol and diltiazem for rate control.  Eliquis for anticoagulation.   6.  Advanced dementia Patient being followed by hospice at memory care unit.  Patient was placed on hospice 08/15/2019.  Patient alert to self only, wheelchair-bound.   Place on Zyprexa 2.5 mg nightly as needed agitation.  Palliative care consulted have seen patient and recommended on discharge to follow-up with hospice at [facility] place.  7.  Hyperthyroidism CT chest with multinodular goiter.  Continue Tapazole.  Outpatient follow-up.  8.  Pseudohypocalcemia Corrected calcium of 9.28.   DVT prophylaxis: Eliquis Code Status: DNR Family Communication: Updated patient and daughter at bedside. Disposition:   Status is: Inpatient    Dispo: The patient is from: Memory care unit/[facility]              Anticipated d/c is to: Back to memory care unit/[facility] with hospice following.              Anticipated d/c date is: Hopefully tomorrow.               Patient currently on IV antibiotics for pneumonia.        Consultants:   Palliative care: Dr. Linna Darner 12/07/2019  Procedures:   CT chest 12/06/2019  Chest x-ray 12/06/2019    Antimicrobials:   IV aztreonam 12/07/2019  IV cefepime 12/06/2019>>>> 12/07/2019  IV vancomycin 12/06/2019>>>>12/08/2019    Subjective: Patient sleeping peacefully.  Easily arousable.  Denies any chest pain or shortness of breath.  Daughter at bedside.  Patient noted to have had a bout of flash pulmonary edema overnight and IV fluids subsequently discontinued and patient given breathing treatments.  Objective: Vitals:   12/07/19 2054 12/08/19 0607 12/08/19 0617 12/08/19 0927  BP: (!) 128/95  (!) 156/96 125/77  Pulse: 73  90 78  Resp: 20     Temp: 97.8 F (36.6 C)  97.8 F (36.6 C)   TempSrc: Oral  Oral   SpO2: 97% 98% 98% 98%  Weight:      Height:        Intake/Output Summary (Last 24 hours) at 12/08/2019 1240 Last data filed at 12/08/2019 6440 Gross per 24 hour  Intake 200 ml  Output 150 ml  Net 50 ml   Filed Weights   12/06/19 0412  Weight: 43.1 kg    Examination:  General exam: NAD  Respiratory system: CTAB anterior lung fields. Cardiovascular system: Irregularly irregular.  No murmurs rubs or  gallops.  No JVD.  No lower extremity edema.  Gastrointestinal system: Abdomen is nontender, nondistended, soft, positive bowel sounds.  No rebound.  No guarding. Central nervous system: No focal neurological deficits.  Moving extremities spontaneously. Extremities: Symmetric 5 x 5 power. Skin: No rashes, lesions or ulcers Psychiatry: Judgement and insight appear poor. Mood & affect appropriate.     Data Reviewed: I have personally reviewed following labs and imaging studies  CBC: Recent Labs  Lab 12/06/19 0446 12/07/19 0534 12/08/19 0545  WBC 12.5* 9.2 9.0  NEUTROABS 10.8*  --  7.3  HGB 10.5* 10.7* 10.8*  HCT 34.4* 34.8* 35.9*  MCV 94.5 92.6 95.0  PLT 154 179 181    Basic Metabolic Panel: Recent Labs  Lab 12/06/19 0446 12/06/19 1304 12/07/19 0534 12/08/19 0545  NA 140  --  137 140  K 4.2  --  3.7 3.5  CL 107  --  105 110  CO2 24  --  26 21*  GLUCOSE 125*  --  106* 94  BUN 31*  --  27* 25*  CREATININE 0.90  --  0.91 0.69  CALCIUM 8.4*  --  8.4* 8.3*  MG  --  2.0  --   --   PHOS  --  3.9  --   --     GFR: Estimated Creatinine Clearance: 35.6 mL/min (by C-G formula based on SCr of 0.69 mg/dL).  Liver Function Tests: Recent Labs  Lab 12/07/19 0534  AST 27  ALT 24  ALKPHOS 177*  BILITOT 1.6*  PROT 5.8*  ALBUMIN 2.9*    CBG: No results for input(s): GLUCAP in the last 168 hours.   Recent Results (from the past 240 hour(s))  Respiratory Panel by RT PCR (Flu A&B, Covid) - Nasopharyngeal Swab     Status: None   Collection Time: 12/06/19  7:18 AM   Specimen: Nasopharyngeal Swab  Result Value Ref Range Status   SARS Coronavirus 2 by RT PCR NEGATIVE NEGATIVE Final    Comment: (NOTE) SARS-CoV-2 target nucleic acids are NOT DETECTED. The SARS-CoV-2 RNA is generally detectable in upper respiratoy specimens during the acute phase of infection. The lowest concentration of SARS-CoV-2 viral copies this assay can detect is 131 copies/mL. A negative result does  not preclude SARS-Cov-2 infection and should not be used as the sole basis for treatment or other patient management decisions. A negative result may occur with  improper specimen collection/handling, submission of specimen other than nasopharyngeal swab, presence of viral mutation(s) within the areas targeted by this assay, and inadequate number of viral copies (<131 copies/mL). A negative result must be combined with clinical observations, patient history, and epidemiological information. The expected result is Negative. Fact Sheet for Patients:  https://www.moore.com/ Fact Sheet for Healthcare Providers:  https://www.young.biz/ This test is not yet ap proved or cleared by the [place] FDA and  has been authorized for detection and/or diagnosis of SARS-CoV-2 by FDA under an Emergency Use Authorization (EUA). This EUA will remain  in effect (meaning this test can be used) for the duration of the COVID-19 declaration under Section 564(b)(1) of the Act, 21 U.S.C. section 360bbb-3(b)(1), unless the authorization is terminated or revoked sooner.    Influenza A by PCR NEGATIVE NEGATIVE Final   Influenza B by PCR NEGATIVE NEGATIVE Final    Comment: (NOTE) The Xpert Xpress SARS-CoV-2/FLU/RSV assay is intended as an aid in  the diagnosis of influenza from Nasopharyngeal swab specimens and  should not be used as a sole basis for treatment. Nasal washings and  aspirates are unacceptable for Xpert Xpress SARS-CoV-2/FLU/RSV  testing. Fact Sheet for Patients: https://www.moore.com/ Fact Sheet for Healthcare Providers: https://www.young.biz/ This test is not yet approved or cleared by the [place] FDA and  has been authorized for detection and/or diagnosis of SARS-CoV-2 by  FDA under an Emergency Use Authorization (EUA). This EUA will remain  in effect (meaning this test can be used) for the duration of the    Covid-19 declaration under Section 564(b)(1) of the Act, 21  U.S.C. section 360bbb-3(b)(1), unless the authorization is  terminated or revoked. Performed at [facility], [address], [place], [place] [address]   Blood culture (routine x 2)     Status: None (Preliminary result)   Collection Time: 12/06/19  9:30 AM   Specimen: BLOOD  Result Value Ref Range Status   Specimen Description   Final    BLOOD RIGHT ANTECUBITAL Performed at [facility], [address] [place], [place] [address]    Special Requests   Final    BOTTLES DRAWN AEROBIC AND ANAEROBIC  Blood Culture results may not be optimal due to an inadequate volume of blood received in culture bottles Performed at [facility], [address] [place], [place] [address]    Culture   Final    NO GROWTH 2 DAYS Performed at [facility], [address], [place], [place] [address]    Report Status PENDING  Incomplete  Blood culture (routine x 2)     Status: None (Preliminary result)   Collection Time: 12/06/19  9:35 AM   Specimen: BLOOD  Result Value Ref Range Status   Specimen Description   Final    BLOOD RIGHT ANTECUBITAL Performed at [facility], [address], [place], [place] [address]    Special Requests   Final    BOTTLES DRAWN AEROBIC AND ANAEROBIC Blood Culture results may not be optimal due to an inadequate volume of blood received in culture bottles Performed at [facility], [address], [place], [place] [address]    Culture   Final    NO GROWTH 2 DAYS Performed at [facility], [address], [place], [place] [address]    Report Status PENDING  Incomplete  MRSA PCR Screening     Status: None   Collection Time: 12/07/19 10:56 AM   Specimen: Urine, Clean Catch; Nasopharyngeal  Result Value Ref Range Status   MRSA by PCR NEGATIVE NEGATIVE Final    Comment:        The GeneXpert MRSA Assay  (FDA approved for NASAL specimens only), is one component of a comprehensive MRSA colonization surveillance program. It is not intended to diagnose MRSA infection nor to guide or monitor treatment for MRSA infections. Performed at [facility], [address] [place], [place] [address]          Radiology Studies: No results found.      Scheduled Meds: . apixaban  2.5 mg Oral BID  . diltiazem  360 mg Oral Daily  . diltiazem  90 mg Oral Once  . feeding supplement (ENSURE ENLIVE)  237 mL Oral TID BM  . methimazole  5 mg Oral Daily  . metoprolol tartrate  5 mg Intravenous Q8H  . rivastigmine  13.3 mg Transdermal Daily   Continuous Infusions: . aztreonam       LOS: 2 days    Time spent: 40 minutes    Ramiro Harvest, MD [facility]   To contact the attending provider between 7A-7P or the covering provider during after hours 7P-7A, please log into the web site [email or web address] and access using universal [place] password for that web site. If you do not have the password, please call the hospital operator.  12/08/2019, 12:40 PM

## 2019-12-09 DIAGNOSIS — R0902 Hypoxemia: Secondary | ICD-10-CM

## 2019-12-09 DIAGNOSIS — F0390 Unspecified dementia without behavioral disturbance: Secondary | ICD-10-CM

## 2019-12-09 LAB — RESPIRATORY PANEL BY RT PCR (FLU A&B, COVID)
Influenza A by PCR: NEGATIVE
Influenza B by PCR: NEGATIVE
SARS Coronavirus 2 by RT PCR: NEGATIVE

## 2019-12-09 LAB — BASIC METABOLIC PANEL
Anion gap: 8 (ref 5–15)
BUN: 24 mg/dL — ABNORMAL HIGH (ref 8–23)
CO2: 21 mmol/L — ABNORMAL LOW (ref 22–32)
Calcium: 8.2 mg/dL — ABNORMAL LOW (ref 8.9–10.3)
Chloride: 112 mmol/L — ABNORMAL HIGH (ref 98–111)
Creatinine, Ser: 0.58 mg/dL (ref 0.44–1.00)
GFR calc Af Amer: >60 mL/min (ref >60)
GFR calc non Af Amer: >60 mL/min (ref >60)
Glucose, Bld: 94 mg/dL (ref 70–99)
Potassium: 3.6 mmol/L (ref 3.5–5.1)
Sodium: 141 mmol/L (ref 135–145)

## 2019-12-09 LAB — CBC
HCT: 33.3 % — ABNORMAL LOW (ref 36.0–46.0)
Hemoglobin: 10 g/dL — ABNORMAL LOW (ref 12.0–15.0)
MCH: 28.7 pg (ref 26.0–34.0)
MCHC: 30 g/dL (ref 30.0–36.0)
MCV: 95.7 fL (ref 80.0–100.0)
Platelets: 156 10*3/uL (ref 150–400)
RBC: 3.48 MIL/uL — ABNORMAL LOW (ref 3.87–5.11)
RDW: 16.5 % — ABNORMAL HIGH (ref 11.5–15.5)
WBC: 5.9 10*3/uL (ref 4.0–10.5)
nRBC: 0 % (ref 0.0–0.2)

## 2019-12-09 LAB — LEGIONELLA PNEUMOPHILA SEROGP 1 UR AG: L. pneumophila Serogp 1 Ur Ag: NEGATIVE

## 2019-12-09 MED ORDER — ACETAMINOPHEN 325 MG PO TABS
650.0000 mg | ORAL_TABLET | Freq: Four times a day (QID) | ORAL | Status: DC
  Administered 2019-12-09 (×3): 650 mg via ORAL
  Filled 2019-12-09 (×3): qty 2

## 2019-12-09 MED ORDER — LORAZEPAM 0.5 MG PO TABS: 0.2500 mg | ORAL_TABLET | Freq: Four times a day (QID) | ORAL | 0 refills | Status: AC | PRN

## 2019-12-09 MED ORDER — METOPROLOL TARTRATE 50 MG PO TABS
50.0000 mg | ORAL_TABLET | Freq: Two times a day (BID) | ORAL | Status: AC
  Administered 2019-12-09: 50 mg via ORAL
  Filled 2019-12-09: qty 2

## 2019-12-09 MED ORDER — IPRATROPIUM-ALBUTEROL 0.5-2.5 (3) MG/3ML IN SOLN: 3.0000 mL | Freq: Four times a day (QID) | RESPIRATORY_TRACT | 0 refills | Status: AC | PRN

## 2019-12-09 MED ORDER — OLANZAPINE 5 MG PO TBDP: 2.5000 mg | ORAL_TABLET | Freq: Every evening | ORAL | 0 refills | Status: AC | PRN

## 2019-12-09 MED ORDER — MUCINEX 600 MG PO TB12
600.0000 mg | ORAL_TABLET | Freq: Two times a day (BID) | ORAL | 0 refills | Status: AC
Start: 2019-12-09 — End: 2019-12-14

## 2019-12-09 MED ORDER — LEVOFLOXACIN 750 MG PO TABS
750.0000 mg | ORAL_TABLET | ORAL | 0 refills | Status: AC
Start: 2019-12-09 — End: 2019-12-13

## 2019-12-09 MED ORDER — LORAZEPAM 0.5 MG PO TABS
0.2500 mg | ORAL_TABLET | Freq: Every day | ORAL | Status: AC
  Administered 2019-12-09: 0.25 mg via ORAL
  Filled 2019-12-09: qty 1

## 2019-12-09 NOTE — TOC Transition Note (Addendum)
Transition of Care [facility]) - CM/SW Discharge Note   Patient Details  Name: Amy Randall MRN: [id number] Date of Birth: 09-04-1933  Transition [facility] CM/SW Contact:  Ida Rogue, LCSW Phone Number: 12/09/2019, 4:24 PM   Clinical Narrative:  Patient to return to [facility] today. I called [facility] to alert them.  [facility] will transport. Nursing please call for transportation after nebulizer has arrived-[phone]. Also, please call report to [phone] for memory care.  TOC sign off.     Final next level of care: Memory Care Barriers to Discharge: No Barriers Identified   Patient Goals and CMS Choice        Discharge Placement                       Discharge Plan and Services                                     Social Determinants of Health (SDOH) Interventions     Readmission Risk Interventions Readmission Risk Prevention Plan 01/04/2019  Transportation Screening Complete  PCP or Specialist Appt within 5-7 Days Complete  Home Care Screening Complete  Medication Review (RN CM) Complete  Some recent data might be hidden

## 2019-12-09 NOTE — Care Management Important Message (Signed)
Important Message  Patient Details  IM Letter given to Daryel Gerald SW Case Manager to present to the Patient Name: Amy Randall MRN: [id number] Date of Birth: 1933/12/28   Medicare Important Message Given:  Yes     Caren Macadam 12/09/2019, 12:21 PM

## 2019-12-09 NOTE — Discharge Summary (Signed)
Physician Discharge Summary  Lysle DingwallVirginia T Bloodsworth EAV:[id number] DOB: [birth year] DOA: 12/06/2019  PCP: Kari BaarsHawkins, Edward, MD  Admit date: 12/06/2019 Discharge date: 12/09/2019  Time spent: 60 minutes  Recommendations for Outpatient Follow-up:  1. Follow-up with Kari BaarsHawkins, Edward, MD in 1 to 2 weeks.  On follow-up patient will need a basic metabolic profile done to follow-up on electrolytes and renal function.   Discharge Diagnoses:  Active Problems:   HCAP (healthcare-associated pneumonia)   Multifocal pneumonia   Hyperthyroidism   Permanent atrial fibrillation (HCC)   Advanced dementia (HCC)   History of CVA (cerebrovascular accident)   Anemia of chronic disease   Discharge Condition: Stable and improved  Diet recommendation: Dysphagia 3 diet with thin liquids.  Filed Weights   12/06/19 0412  Weight: 43.1 kg    History of present illness:  HPI per Dr. Zonia Kiefhomas  Amy Randall is a 86 y.o. female with medical history significant of  Dementia,atrial fibrillation, hypertension,hyperthyroidism, ischemic stroke right PCA, 12/20 on hospice due to advanced dementia who presented to ED with increase work of breathing. Of note patient is a poor historian and history was taken from ED Chart, hospice RN and patient Daughter Shelah LewandowskyDenise Whitmire. Per daughter at baseline patient was intermittently alert to self only, follows simple commands, and ambulates with a wheel chair. Per daughter patient has had increase difficulty with breathing over the last 24 hours. She was evaluated by hospice RN earlier in the day and was noted to have increase work of breathing that was intermittent and would resolve. At that time patient was noted to have stable vitals and saturation at NH so no intervention was employed at that time. Unfortunately patient respiratory status decompensated with noted severe persistent tachypnea and patient was transferred to ED early am of 12/06/19 for stabilization and symptom management. Per  daughter patient did not have a fever but she was noted to be intermittently congested.  ED Course: IN ed Initial vitals: 98.4, BP142/82  Hr 77,  rr 40 on 100% nrb sat 100% Room air sat lowest 87% patient transitioned to [place] 2L and later transitioned to ra with sat 94%  Labs: wbc 12.5, increase pmn 86, hgb 10.5 down from 12.4 Cr 0.9,bun 31 BNP 996 / 12/20 echo ef 60-655 no mention of diastolic dysfunction  CE:46, EKG : afib  Poor quality , low voltage  Repeat pending Respiratory panel pending Abg:7.5/32/po2 54/sat 88 CT  [place]:  1. Multifocal opacity in the right lung, concerning for pneumonia. Small pleural effusions with basilar atelectasis. 2. New T11 compression deformity since 01/02/19, but otherwise age indeterminate. 3. Multinodular goiter deviates the trachea to the right. In the setting of significant comorbidities or limited life expectancy, no follow-up recommended (ref: J Am Coll Radiol. 2015 Feb;12(2): 143-50). 4. Aortic Atherosclerosis (ICD10-I70.0). TX:  With nebs, Lasix 40 mg  Hospital Course:  1 acute hypoxic respiratory failure secondary to HCAP versus aspiration pneumonia Patient admitted acute hypoxic respiratory failure with tachypnea, worsening confusion, increasing shortness of breath and increased work of breathing.  Patient noted to have severe persistent tachypnea subsequently transferred to the ED.  CT chest done consistent with a multifocal pneumonia on the right with multinodular goiter deviating trachea to the right.  Patient with history of dementia.  MRSA PCR negative.  Sputum Gram stain and culture ordered with no growth to date.  Blood cultures which were done with no growth to date.  SARS coronavirus PCR negative.  Influenza A and B negative.  Patient initially placed  empirically on IV vancomycin IV cefepime.  IV vancomycin subsequently discontinued as MRSA PCR was negative.  IV cefepime discontinued due to development of a rash per RN when antibiotic  was given.  Patient was subsequently placed on IV aztreonam which she tolerated.  Patient will be discharged on 4 more days of oral Levaquin 750 mg every 48 hours to complete a 7-day course of antibiotic treatment.  Patient also be discharged on nebs as needed.  By day of discharge patient sats was 100% on room air.  Outpatient follow-up with PCP.   2.  Anemia of chronic disease H&H remained stable.  3.  T11 compression fracture Supportive care.  4.  History of recent ischemic CVA/right PCA, POA, other non hemmorrhagic Patient was maintained on home regimen of Eliquis for secondary stroke prophylaxis.    Patient was also started on statin however daughter stated statin was discontinued per PCP.  Risk factor modification.   5.  Permanent atrial fibrillation Patient was maintained on metoprolol and diltiazem for rate control.  Eliquis for anticoagulation.   6.  Advanced dementia Patient being followed by hospice at memory care unit.  Patient was placed on hospice 08/15/2019.  Patient alert to self only, wheelchair-bound.  Placed on Zyprexa 2.5 mg nightly as needed agitation.  Palliative care consulted have seen patient and recommended on discharge to follow-up with hospice at [facility].  7.  Hyperthyroidism CT chest with multinodular goiter.    Patient maintained on home regimen of Tapazole.  Outpatient follow-up.    8.  Pseudohypocalcemia Corrected calcium of 9.28.  Procedures:  CT chest 12/06/2019  Chest x-ray 12/06/2019    Consultations:  Palliative care: Dr. Rowe Pavy 12/07/2019  Discharge Exam: Vitals:   12/09/19 1332 12/09/19 1438  BP:  (!) 141/85  Pulse:  (!) 102  Resp:  20  Temp:  98.5 F (36.9 C)  SpO2: 98% 100%    General: NAD Cardiovascular: Irregularly irregular Respiratory: Minimal expiratory wheezing/upper airway noise, speaking in full sentences.  Discharge Instructions   Discharge Instructions    Diet general   Complete by: As directed     Dysphagia 3 diet with thin liquids   Increase activity slowly   Complete by: As directed      Allergies as of 12/09/2019      Reactions   Aricept [donepezil]    Per mar   Bactrim [sulfamethoxazole-trimethoprim] Other (See Comments)   Loss of memory   Ibuprofen Other (See Comments)   Causes bruising   Orange Fruit [citrus]    Per patient's daughter, orange snesitivity   Cefepime Rash   Possible rash developed after cefepime dose administered on pt's abdomen   Penicillins Rash   Has patient had a PCN reaction causing immediate rash, facial/tongue/throat swelling, SOB or lightheadedness with hypotension: Yes Has patient had a PCN reaction causing severe rash involving mucus membranes or skin necrosis: No Has patient had a PCN reaction that required hospitalization: No Has patient had a PCN reaction occurring within the last 10 years: No If all of the above answers are "NO", then may proceed with Cephalosporin use.      Medication List    STOP taking these medications   atorvastatin 20 MG tablet Commonly known as: LIPITOR     TAKE these medications   acetaminophen 325 MG tablet Commonly known as: TYLENOL Take 650 mg by mouth 3 (three) times daily.   apixaban 2.5 MG Tabs tablet Commonly known as: ELIQUIS Take 1 tablet (2.5 mg total)  by mouth 2 (two) times daily.   diltiazem 360 MG 24 hr capsule Commonly known as: CARDIZEM CD Take 1 capsule (360 mg total) by mouth daily.   Ensure Take 237 mLs by mouth 3 (three) times daily between meals.   ipratropium-albuterol 0.5-2.5 (3) MG/3ML Soln Commonly known as: DUONEB Take 3 mLs by nebulization every 6 (six) hours as needed.   levofloxacin 750 MG tablet Commonly known as: Levaquin Take 1 tablet (750 mg total) by mouth every other day for 4 days.   loperamide 2 MG tablet Commonly known as: IMODIUM A-D Take 2-4 mg by mouth 4 (four) times daily as needed for diarrhea or loose stools.   LORazepam 0.5 MG tablet Commonly known  as: ATIVAN Take 0.5 tablets (0.25 mg total) by mouth every 6 (six) hours as needed for anxiety (agitation).   methimazole 5 MG tablet Commonly known as: TAPAZOLE Take 5 mg by mouth daily.   metoprolol tartrate 25 MG tablet Commonly known as: LOPRESSOR Take 1 tablet (25 mg total) by mouth 2 (two) times daily.   Mucinex 600 MG 12 hr tablet Generic drug: guaiFENesin Take 1 tablet (600 mg total) by mouth 2 (two) times daily for 5 days.   OLANZapine zydis 5 MG disintegrating tablet Commonly known as: ZYPREXA Take 0.5 tablets (2.5 mg total) by mouth at bedtime as needed (agitation).   rivastigmine 13.3 MG/24HR Commonly known as: EXELON Place 13.3 mg onto the skin daily.            Durable Medical Equipment  (From admission, onward)         Start     Ordered   12/09/19 1308  For home use only DME Nebulizer machine  Once    Question Answer Comment  Patient needs a nebulizer to treat with the following condition Dyspnea   Length of Need Lifetime      12/09/19 1307         Allergies  Allergen Reactions  . Aricept [Donepezil]     Per mar  . Bactrim [Sulfamethoxazole-Trimethoprim] Other (See Comments)    Loss of memory  . Ibuprofen Other (See Comments)    Causes bruising  . Orange Fruit [Citrus]     Per patient's daughter, orange snesitivity  . Cefepime Rash    Possible rash developed after cefepime dose administered on pt's abdomen  . Penicillins Rash    Has patient had a PCN reaction causing immediate rash, facial/tongue/throat swelling, SOB or lightheadedness with hypotension: Yes Has patient had a PCN reaction causing severe rash involving mucus membranes or skin necrosis: No Has patient had a PCN reaction that required hospitalization: No Has patient had a PCN reaction occurring within the last 10 years: No If all of the above answers are "NO", then may proceed with Cephalosporin use.    Follow-up Information    [name], MD. Schedule an appointment as  soon as possible for a visit in 1 week(s).   Specialty: Pulmonary Disease Why: f/u in 1-2 weeks. Contact information: [address] [place] [address] [phone]        Antoine Poche, MD .   Specialty: Cardiology Contact information: [address] [place] [address] [phone]            The results of significant diagnostics from this hospitalization (including imaging, microbiology, ancillary and laboratory) are listed below for reference.    Significant Diagnostic Studies: CT Chest Wo Contrast  Result Date: 12/06/2019 CLINICAL DATA:  Shortness of breath  EXAM: CT CHEST WITHOUT CONTRAST TECHNIQUE: Multidetector CT imaging of the chest was performed following the standard protocol without IV contrast. COMPARISON:  01/02/2019 CTA chest FINDINGS: Cardiovascular: Moderate cardiomegaly with calcific aortic and coronary artery atherosclerosis. No pericardial effusion. Mediastinum/Nodes: Multinodular goiter deviates the trachea to the right. Lungs/Pleura: Multifocal opacity in the right lung, predominantly in the right upper and lower lobes. Small pleural effusions. Upper Abdomen: Large hepatic cyst is incompletely visualized. Musculoskeletal: There is a new compression deformity at T11. IMPRESSION: 1. Multifocal opacity in the right lung, concerning for pneumonia. Small pleural effusions with basilar atelectasis. 2. New T11 compression deformity since 01/02/19, but otherwise age indeterminate. 3. Multinodular goiter deviates the trachea to the right. In the setting of significant comorbidities or limited life expectancy, no follow-up recommended (ref: J Am Coll Radiol. 2015 Feb;12(2): 143-50). 4. Aortic Atherosclerosis (ICD10-I70.0). Electronically Signed   By: Deatra Robinson M.D.   On: 12/06/2019 06:47   DG Chest Portable 1 View  Result Date: 12/06/2019 CLINICAL DATA:  Shortness of breath EXAM: PORTABLE CHEST 1 VIEW COMPARISON:  08/01/2019 FINDINGS: Mild cardiomegaly.  No focal airspace consolidation. No pleural effusion or pneumothorax. No pulmonary edema. IMPRESSION: Mild cardiomegaly without focal airspace disease or pulmonary edema. Electronically Signed   By: Deatra Robinson M.D.   On: 12/06/2019 05:29    Microbiology: Recent Results (from the past 240 hour(s))  Respiratory Panel by RT PCR (Flu A&B, Covid) - Nasopharyngeal Swab     Status: None   Collection Time: 12/06/19  7:18 AM   Specimen: Nasopharyngeal Swab  Result Value Ref Range Status   SARS Coronavirus 2 by RT PCR NEGATIVE NEGATIVE Final    Comment: (NOTE) SARS-CoV-2 target nucleic acids are NOT DETECTED. The SARS-CoV-2 RNA is generally detectable in upper respiratoy specimens during the acute phase of infection. The lowest concentration of SARS-CoV-2 viral copies this assay can detect is 131 copies/mL. A negative result does not preclude SARS-Cov-2 infection and should not be used as the sole basis for treatment or other patient management decisions. A negative result may occur with  improper specimen collection/handling, submission of specimen other than nasopharyngeal swab, presence of viral mutation(s) within the areas targeted by this assay, and inadequate number of viral copies (<131 copies/mL). A negative result must be combined with clinical observations, patient history, and epidemiological information. The expected result is Negative. Fact Sheet for Patients:  https://www.moore.com/ Fact Sheet for Healthcare Providers:  https://www.young.biz/ This test is not yet ap proved or cleared by the [place] FDA and  has been authorized for detection and/or diagnosis of SARS-CoV-2 by FDA under an Emergency Use Authorization (EUA). This EUA will remain  in effect (meaning this test can be used) for the duration of the COVID-19 declaration under Section 564(b)(1) of the Act, 21 U.S.C. section 360bbb-3(b)(1), unless the authorization is  terminated or revoked sooner.    Influenza A by PCR NEGATIVE NEGATIVE Final   Influenza B by PCR NEGATIVE NEGATIVE Final    Comment: (NOTE) The Xpert Xpress SARS-CoV-2/FLU/RSV assay is intended as an aid in  the diagnosis of influenza from Nasopharyngeal swab specimens and  should not be used as a sole basis for treatment. Nasal washings and  aspirates are unacceptable for Xpert Xpress SARS-CoV-2/FLU/RSV  testing. Fact Sheet for Patients: https://www.moore.com/ Fact Sheet for Healthcare Providers: https://www.young.biz/ This test is not yet approved or cleared by the [place] FDA and  has been authorized for detection and/or diagnosis of SARS-CoV-2 by  FDA under an Emergency  Use Authorization (EUA). This EUA will remain  in effect (meaning this test can be used) for the duration of the  Covid-19 declaration under Section 564(b)(1) of the Act, 21  U.S.C. section 360bbb-3(b)(1), unless the authorization is  terminated or revoked. Performed at [facility], [address] [place], [place] [address]   Blood culture (routine x 2)     Status: None (Preliminary result)   Collection Time: 12/06/19  9:30 AM   Specimen: BLOOD  Result Value Ref Range Status   Specimen Description   Final    BLOOD RIGHT ANTECUBITAL Performed at [facility], [address] [place], [place] [address]    Special Requests   Final    BOTTLES DRAWN AEROBIC AND ANAEROBIC Blood Culture results may not be optimal due to an inadequate volume of blood received in culture bottles Performed at [facility], [address], [place], [place] [address]    Culture   Final    NO GROWTH 3 DAYS Performed at [facility], [address] [place], [place] [address]    Report Status PENDING  Incomplete  Blood culture (routine x 2)     Status: None (Preliminary result)   Collection Time: 12/06/19  9:35 AM   Specimen:  BLOOD  Result Value Ref Range Status   Specimen Description   Final    BLOOD RIGHT ANTECUBITAL Performed at [facility], [address], [place], [place] [address]    Special Requests   Final    BOTTLES DRAWN AEROBIC AND ANAEROBIC Blood Culture results may not be optimal due to an inadequate volume of blood received in culture bottles Performed at [facility], [address] [employer] [place], [place] [address]    Culture   Final    NO GROWTH 3 DAYS Performed at [facility], [address] [place], [place] [address]    Report Status PENDING  Incomplete  MRSA PCR Screening     Status: None   Collection Time: 12/07/19 10:56 AM   Specimen: Urine, Clean Catch; Nasopharyngeal  Result Value Ref Range Status   MRSA by PCR NEGATIVE NEGATIVE Final    Comment:        The GeneXpert MRSA Assay (FDA approved for NASAL specimens only), is one component of a comprehensive MRSA colonization surveillance program. It is not intended to diagnose MRSA infection nor to guide or monitor treatment for MRSA infections. Performed at [facility], [address], [place], [place] [address]   Respiratory Panel by RT PCR (Flu A&B, Covid) - Nasopharyngeal Swab     Status: None   Collection Time: 12/09/19 10:28 AM   Specimen: Nasopharyngeal Swab  Result Value Ref Range Status   SARS Coronavirus 2 by RT PCR NEGATIVE NEGATIVE Final    Comment: (NOTE) SARS-CoV-2 target nucleic acids are NOT DETECTED. The SARS-CoV-2 RNA is generally detectable in upper respiratoy specimens during the acute phase of infection. The lowest concentration of SARS-CoV-2 viral copies this assay can detect is 131 copies/mL. A negative result does not preclude SARS-Cov-2 infection and should not be used as the sole basis for treatment or other patient management decisions. A negative result may occur with  improper specimen collection/handling, submission of specimen  other than nasopharyngeal swab, presence of viral mutation(s) within the areas targeted by this assay, and inadequate number of viral copies (<131 copies/mL). A negative result must be combined with clinical observations, patient history, and  epidemiological information. The expected result is Negative. Fact Sheet for Patients:  https://www.moore.com/ Fact Sheet for Healthcare Providers:  https://www.young.biz/ This test is not yet ap proved or cleared by the [place] FDA and  has been authorized for detection and/or diagnosis of SARS-CoV-2 by FDA under an Emergency Use Authorization (EUA). This EUA will remain  in effect (meaning this test can be used) for the duration of the COVID-19 declaration under Section 564(b)(1) of the Act, 21 U.S.C. section 360bbb-3(b)(1), unless the authorization is terminated or revoked sooner.    Influenza A by PCR NEGATIVE NEGATIVE Final   Influenza B by PCR NEGATIVE NEGATIVE Final    Comment: (NOTE) The Xpert Xpress SARS-CoV-2/FLU/RSV assay is intended as an aid in  the diagnosis of influenza from Nasopharyngeal swab specimens and  should not be used as a sole basis for treatment. Nasal washings and  aspirates are unacceptable for Xpert Xpress SARS-CoV-2/FLU/RSV  testing. Fact Sheet for Patients: https://www.moore.com/ Fact Sheet for Healthcare Providers: https://www.young.biz/ This test is not yet approved or cleared by the [place] FDA and  has been authorized for detection and/or diagnosis of SARS-CoV-2 by  FDA under an Emergency Use Authorization (EUA). This EUA will remain  in effect (meaning this test can be used) for the duration of the  Covid-19 declaration under Section 564(b)(1) of the Act, 21  U.S.C. section 360bbb-3(b)(1), unless the authorization is  terminated or revoked. Performed at [facility], [address], [place], [place] [address]      Labs: Basic Metabolic Panel: Recent Labs  Lab 12/06/19 0446 12/06/19 1304 12/07/19 0534 12/08/19 0545 12/09/19 0556  NA 140  --  137 140 141  K 4.2  --  3.7 3.5 3.6  CL 107  --  105 110 112*  CO2 24  --  26 21* 21*  GLUCOSE 125*  --  106* 94 94  BUN 31*  --  27* 25* 24*  CREATININE 0.90  --  0.91 0.69 0.58  CALCIUM 8.4*  --  8.4* 8.3* 8.2*  MG  --  2.0  --   --   --   PHOS  --  3.9  --   --   --    Liver Function Tests: Recent Labs  Lab 12/07/19 0534  AST 27  ALT 24  ALKPHOS 177*  BILITOT 1.6*  PROT 5.8*  ALBUMIN 2.9*   No results for input(s): LIPASE, AMYLASE in the last 168 hours. No results for input(s): AMMONIA in the last 168 hours. CBC: Recent Labs  Lab 12/06/19 0446 12/07/19 0534 12/08/19 0545 12/09/19 0556  WBC 12.5* 9.2 9.0 5.9  NEUTROABS 10.8*  --  7.3  --   HGB 10.5* 10.7* 10.8* 10.0*  HCT 34.4* 34.8* 35.9* 33.3*  MCV 94.5 92.6 95.0 95.7  PLT 154 179 181 156   Cardiac Enzymes: No results for input(s): CKTOTAL, CKMB, CKMBINDEX, TROPONINI in the last 168 hours. BNP: BNP (last 3 results) Recent Labs    01/02/19 1102 12/06/19 0446  BNP 257.0* 996.4*    ProBNP (last 3 results) No results for input(s): PROBNP in the last 8760 hours.  CBG: No results for input(s): GLUCAP in the last 168 hours.     Signed:  Ramiro Harvest MD.  [facility] 12/09/2019, 3:38 PM

## 2019-12-09 NOTE — Progress Notes (Signed)
Daily Progress Note   Patient Name: Amy Randall       Date: 12/09/2019 DOB: [birth year]  Age: 86 y.o. MRN#: [id number] Attending Physician: Rodolph Bong, MD Primary Care Physician: [name], MD Admit Date: 12/06/2019  Reason for Consultation/Follow-up: Establishing goals of care  Subjective: I saw and examined Amy Randall today.  She is pleasantly confused but denies complaints today.  Her daughter is a bedside and we discussed plan for return to memory care unit with hospice services.  Discussed plan for symptom management and she requested that tylenol be scheduled every 6 hours on discharge.  Reports that it was scheduled medication prior to admission and she did well with pain management with it on scheduled basis as she does not have insight to request it when she hurts.   Length of Stay: 3  Current Medications: Scheduled Meds:  . acetaminophen  650 mg Oral Q6H  . apixaban  2.5 mg Oral BID  . diltiazem  360 mg Oral Daily  . diltiazem  90 mg Oral Once  . feeding supplement (ENSURE ENLIVE)  237 mL Oral TID BM  . methimazole  5 mg Oral Daily  . metoprolol tartrate  5 mg Intravenous Q8H  . rivastigmine  13.3 mg Transdermal Daily    Continuous Infusions: . aztreonam 2 g (12/09/19 0618)    PRN Meds: enalaprilat, hydrALAZINE, ipratropium-albuterol, loperamide, LORazepam, OLANZapine zydis, ondansetron (ZOFRAN) IV  Physical Exam  General: Alert, awake, in no acute distress.  HEENT: No bruits, no goiter, no JVD Heart: Regular rate and rhythm. Lungs: No increased work of breathing Ext: No significant edema Skin: Warm and dry          Vital Signs: BP (!) 152/95 (BP Location: Left Arm)   Pulse 92   Temp (!) 97.5 F (36.4 C) (Oral)   Resp 20   Ht 5\' 1"  (1.549  m)   Wt 43.1 kg   SpO2 98%   BMI 17.95 kg/m  SpO2: SpO2: 98 % O2 Device: O2 Device: Room Air O2 Flow Rate: O2 Flow Rate (L/min): 2 L/min  Intake/output summary:   Intake/Output Summary (Last 24 hours) at 12/09/2019 1055 Last data filed at 12/08/2019 2140 Gross per 24 hour  Intake 69.69 ml  Output 100 ml  Net -30.31 ml   LBM: Last BM  Date: 12/08/19 Baseline Weight: Weight: 43.1 kg Most recent weight: Weight: 43.1 kg       Palliative Assessment/Data:      Patient Active Problem List   Diagnosis Date Noted  . Multifocal pneumonia   . Hyperthyroidism   . Permanent atrial fibrillation (HCC)   . Advanced dementia (HCC)   . History of CVA (cerebrovascular accident)   . Anemia of chronic disease   . HCAP (healthcare-associated pneumonia) 12/06/2019  . Stroke (HCC) 08/01/2019  . Tachycardia 01/02/2019  . Acute CHF (congestive heart failure) (HCC) 01/02/2019  . Hip fracture (HCC) 12/14/2018  . Essential hypertension 12/14/2018  . Dementia without behavioral disturbance (HCC) 12/14/2018  . AF (paroxysmal atrial fibrillation) (HCC) 12/14/2018  . Hyperglycemia 12/14/2018    Palliative Care Assessment & Plan   Patient Profile:  86 year old lady with dementia atrial fibrillation hypertension hyperthyroidism ischemic stroke history.  Patient has been on hospice care since December 2020.  Has advanced dementia.  Patient presented with decompensated respiratory status persistent tachypnea and was admitted to hospital medicine service for healthcare associated pneumonia, hypoxic respiratory failure.  Patient has T11 compression fracture also had mild acute kidney injury.  History of recent ischemic cerebrovascular accident remains on anticoagulation with Eliquis also has permanent A. Fib.  Patient's baseline is such that she is alert to self but is wheelchair-bound.  She is currently at [facility].  She has hospice support.  Palliative consult for ongoing supportive care has been  requested.   Assessment:  acute hypoxic resp failure secondary to HCAP versus component of aspiration PNA. A fib.  Episode of volume overload last night, IVF reduced and patient given breathing treatments.  Restless ness and agitation at times.  Minimal to moderate PO intake.   Recommendations/Plan:   Pain: Scheduled tylenol on regular basis on discharge back to memory care with hospice.  Goals: Transition back to [facility] memory care with hospice services today.   Code Status:    Code Status Orders  (From admission, onward)         Start     Ordered   12/06/19 1031  Do not attempt resuscitation (DNR)  Continuous    Question Answer Comment  In the event of cardiac or respiratory ARREST Do not call a "code blue"   In the event of cardiac or respiratory ARREST Do not perform Intubation, CPR, defibrillation or ACLS   In the event of cardiac or respiratory ARREST Use medication by any route, position, wound care, and other measures to relive pain and suffering. May use oxygen, suction and manual treatment of airway obstruction as needed for comfort.   Comments see MOST FORM      12/06/19 1036        Code Status History    Date Active Date Inactive Code Status Order ID Comments User Context   12/06/2019 0622 12/06/2019 1036 DNR [id number]  Ward, Layla Maw, DO ED   08/04/2019 1255 08/08/2019 2312 DNR [id number]  Edsel Petrin, DO Inpatient   08/01/2019 1707 08/04/2019 1255 Full Code [id number]  Lanae Boast, MD ED   01/02/2019 1805 01/04/2019 1638 Full Code [id number]  Shon Hale, MD Inpatient   12/14/2018 0952 12/17/2018 2051 Full Code [id number]  Jonah Blue, MD ED   Advance Care Planning Activity    Advance Directive Documentation     Most Recent Value  Type of Advance Directive  Healthcare Power of Attorney  Pre-existing out of facility DNR order (yellow form or pink MOST form)  -  "  MOST" Form in Place?  -      Discharge Planning:  [place] with  continued hospice services through [facility] was discussed with daughter Amy Randall.   Thank you for allowing the Palliative Medicine Team to assist in the care of this patient.   Time In: 1020 Time Out: 1040 Total Time 20 Prolonged Time Billed  no    Greater than 50%  of this time was spent counseling and coordinating care related to the above assessment and plan.   Micheline Rough, MD  Please contact Palliative Medicine Team phone at [phone] for questions and concerns.

## 2019-12-09 NOTE — Progress Notes (Signed)
Went over discharge instructions w/ pt daughter. She verbalized understanding

## 2019-12-09 NOTE — Progress Notes (Signed)
PTAR notified that patient was ready for discharge and needed transportation to [facility] [place] and I spoke with Dreama. [employer] notified of patients return to facility with discharge instructions. Discharge instructions received by fax per Jobie Quaker, Med Tech.

## 2019-12-11 LAB — CULTURE, BLOOD (ROUTINE X 2)
Culture: NO GROWTH
Culture: NO GROWTH

## 2020-02-20 ENCOUNTER — Emergency Department (HOSPITAL_COMMUNITY)
Admission: EM | Admit: 2020-02-20 | Discharge: 2020-02-20 | Disposition: A | Discharge status: Home / Self Care | Attending: Emergency Medicine | Admitting: Emergency Medicine

## 2020-02-20 ENCOUNTER — Encounter (HOSPITAL_COMMUNITY): Payer: Self-pay | Admitting: *Deleted

## 2020-02-20 ENCOUNTER — Other Ambulatory Visit: Payer: Self-pay

## 2020-02-20 ENCOUNTER — Encounter (HOSPITAL_COMMUNITY): Payer: Self-pay

## 2020-02-20 DIAGNOSIS — I2510 Atherosclerotic heart disease of native coronary artery without angina pectoris: Secondary | ICD-10-CM | POA: Insufficient documentation

## 2020-02-20 DIAGNOSIS — Y929 Unspecified place or not applicable: Secondary | ICD-10-CM | POA: Diagnosis not present

## 2020-02-20 DIAGNOSIS — W1839XA Other fall on same level, initial encounter: Secondary | ICD-10-CM | POA: Diagnosis not present

## 2020-02-20 DIAGNOSIS — Y999 Unspecified external cause status: Secondary | ICD-10-CM | POA: Diagnosis not present

## 2020-02-20 DIAGNOSIS — S50311A Abrasion of right elbow, initial encounter: Secondary | ICD-10-CM | POA: Diagnosis not present

## 2020-02-20 DIAGNOSIS — F0390 Unspecified dementia without behavioral disturbance: Secondary | ICD-10-CM | POA: Diagnosis not present

## 2020-02-20 DIAGNOSIS — I10 Essential (primary) hypertension: Secondary | ICD-10-CM | POA: Diagnosis not present

## 2020-02-20 DIAGNOSIS — S59901A Unspecified injury of right elbow, initial encounter: Secondary | ICD-10-CM | POA: Diagnosis present

## 2020-02-20 DIAGNOSIS — W19XXXA Unspecified fall, initial encounter: Secondary | ICD-10-CM

## 2020-02-20 DIAGNOSIS — Y939 Activity, unspecified: Secondary | ICD-10-CM | POA: Insufficient documentation

## 2020-02-20 HISTORY — DX: Dementia in other diseases classified elsewhere, unspecified severity, without behavioral disturbance, psychotic disturbance, mood disturbance, and anxiety: F02.80

## 2020-02-20 HISTORY — DX: Atherosclerotic heart disease of native coronary artery without angina pectoris: I25.10

## 2020-02-20 HISTORY — DX: Hyperlipidemia, unspecified: E78.5

## 2020-02-20 NOTE — ED Provider Notes (Signed)
[facility] EMERGENCY DEPARTMENT Provider Note  CSN: [id number] Arrival date & time: 02/20/20 0140  Chief Complaint(s) Fall ED Triage Notes Tacey Heap, RN (Registered Nurse) . Marland Kitchen Date of Service: 02/20/2020 1:50 AM . . Signed   Pt arrived by [facility] from [facility] after rolling from bed. Pt is on Eliquis and is also a hospice patient. Skin tear noted to R upper arm. C-collar in place       HPI Amy Randall is a 86 y.o. female patient presents from skilled nursing facility after being found down next to her bed.  Patient on anticoagulation.  Activated as a level 2 trauma by EMS.  Hemodynamically stable.  Remainder of history, ROS, and physical exam limited due to patient's condition (dementia). Additional information was obtained from EMS and daughter.   Level V Caveat.    HPI  Past Medical History Past Medical History:  Diagnosis Date  . Alzheimer disease (HCC)   . Coronary artery disease   . Dementia (HCC)   . Hyperlipidemia   . Hypertension    There are no problems to display for this patient.  Home Medication(s) Prior to Admission medications   Not on File                                                                                                                                    Past Surgical History History reviewed. No pertinent surgical history. Family History No family history on file.  Social History Social History   Tobacco Use  . Smoking status: Not on file  Substance Use Topics  . Alcohol use: Never  . Drug use: Never   Allergies Patient has no allergy information on record.  Review of Systems Review of Systems  Unable to perform ROS: Dementia    Physical Exam Vital Signs  I have reviewed the triage vital signs BP (!) 151/99 (BP Location: Left Arm)   Pulse 98   Temp 97.6 F (36.4 C) (Temporal)   Resp (!) 22   Wt 43.1 kg Comment: previous chart   SpO2 100%   Physical Exam Constitutional:       General: She is not in acute distress.    Appearance: She is well-developed. She is not diaphoretic.  HENT:     Head: Normocephalic and atraumatic.     Right Ear: External ear normal.     Left Ear: External ear normal.     Nose: Nose normal.  Eyes:     General: No scleral icterus.       Right eye: No discharge.        Left eye: No discharge.     Conjunctiva/sclera: Conjunctivae normal.     Pupils: Pupils are equal, round, and reactive to light.  Cardiovascular:     Rate and Rhythm: Normal rate and regular rhythm.     Pulses:  Radial pulses are 2+ on the right side and 2+ on the left side.       Dorsalis pedis pulses are 2+ on the right side and 2+ on the left side.     Heart sounds: Normal heart sounds. No murmur heard.  No friction rub. No gallop.   Pulmonary:     Effort: Pulmonary effort is normal. No respiratory distress.     Breath sounds: Normal breath sounds. No stridor. No wheezing.  Abdominal:     General: There is no distension.     Palpations: Abdomen is soft.     Tenderness: There is no abdominal tenderness.  Musculoskeletal:        General: No tenderness.       Arms:     Cervical back: Normal range of motion and neck supple. No bony tenderness.     Thoracic back: No bony tenderness.     Lumbar back: No bony tenderness.     Comments: Clavicles stable. Chest stable to AP/Lat compression. Pelvis stable to Lat compression. No obvious extremity deformity. No chest or abdominal wall contusion.  Skin:    General: Skin is warm and dry.     Findings: No erythema or rash.  Neurological:     Mental Status: She is alert and oriented to person, place, and time.     Comments: Moving all extremities     ED Results and Treatments Labs (all labs ordered are listed, but only abnormal results are displayed) Labs Reviewed - No data to display                                                                                                                       EKG   EKG Interpretation  Date/Time:    Ventricular Rate:    PR Interval:    QRS Duration:   QT Interval:    QTC Calculation:   R Axis:     Text Interpretation:        Radiology No results found.  Pertinent labs & imaging results that were available during my care of the patient were reviewed by me and considered in my medical decision making (see chart for details).  Medications Ordered in ED Medications - No data to display  Procedures Procedures  (including critical care time)  Medical Decision Making / ED Course I have reviewed the nursing notes for this encounter and the patient's prior records (if available in EHR or on provided paperwork).   Amy Randall was evaluated in Emergency Department on 02/20/2020 for the symptoms described in the history of present illness. She was evaluated in the context of the global COVID-19 pandemic, which necessitated consideration that the patient might be at risk for infection with the SARS-CoV-2 virus that causes COVID-19. Institutional protocols and algorithms that pertain to the evaluation of patients at risk for COVID-19 are in a state of rapid change based on information released by regulatory bodies including the CDC and federal and state organizations. These policies and algorithms were followed during the patient's care in the ED.  Level 2 trauma due to fall on anticoagulation. Hospice patient. Spoke with patient's daughter who based on patient's current baseline mental status and lack of evidence showing new injury, she did not want any imaging or work-up performed.  I feel this is appropriate.        Final Clinical Impression(s) / ED Diagnoses Final diagnoses:  Fall, initial encounter   The patient appears reasonably screened and/or stabilized for discharge and I doubt any other medical condition or  other [facility] requiring further screening, evaluation, or treatment in the ED at this time prior to discharge. Safe for discharge with strict return precautions.  Disposition: Discharge  Condition: Good  I have discussed the results, Dx and Tx plan with the patient/family who expressed understanding and agree(s) with the plan. Discharge instructions discussed at length. The patient/family was given strict return precautions who verbalized understanding of the instructions. No further questions at time of discharge.    ED Discharge Orders    None       Follow Up: [name], MD  Schedule an appointment as soon as possible for a visit  As needed     This chart was dictated using voice recognition software.  Despite best efforts to proofread,  errors can occur which can change the documentation meaning.   Nira Conn, MD 02/20/20 [phone]

## 2020-02-20 NOTE — ED Notes (Signed)
Ptar called 

## 2020-02-20 NOTE — Progress Notes (Signed)
Orthopedic Tech Progress Note Patient Details:  Amy Randall 10-04-1933 [id number] Level 2 Trauma  Patient ID: Amy Randall, female   DOB: 01/24/1934, 86 y.o.   MRN: [id number]   Amy Randall 02/20/2020, 1:52 AM

## 2020-02-20 NOTE — ED Triage Notes (Signed)
Pt arrived by [facility] from [facility] after rolling from bed. Pt is on Eliquis and is also a hospice patient. Skin tear noted to R upper arm. C-collar in place

## 2020-03-19 ENCOUNTER — Emergency Department (HOSPITAL_COMMUNITY)

## 2020-03-19 ENCOUNTER — Inpatient Hospital Stay (HOSPITAL_COMMUNITY)
Admission: EM | Admit: 2020-03-19 | Discharge: 2020-03-23 | DRG: 100 | Disposition: A | Discharge status: Hospice | Source: Skilled Nursing Facility | Attending: Internal Medicine | Admitting: Internal Medicine

## 2020-03-19 ENCOUNTER — Encounter (HOSPITAL_COMMUNITY): Payer: Self-pay | Admitting: Emergency Medicine

## 2020-03-19 ENCOUNTER — Other Ambulatory Visit: Payer: Self-pay

## 2020-03-19 DIAGNOSIS — F0390 Unspecified dementia without behavioral disturbance: Secondary | ICD-10-CM | POA: Diagnosis present

## 2020-03-19 DIAGNOSIS — R296 Repeated falls: Secondary | ICD-10-CM | POA: Diagnosis present

## 2020-03-19 DIAGNOSIS — K7200 Acute and subacute hepatic failure without coma: Secondary | ICD-10-CM | POA: Diagnosis present

## 2020-03-19 DIAGNOSIS — Y92009 Unspecified place in unspecified non-institutional (private) residence as the place of occurrence of the external cause: Secondary | ICD-10-CM

## 2020-03-19 DIAGNOSIS — R569 Unspecified convulsions: Secondary | ICD-10-CM | POA: Diagnosis present

## 2020-03-19 DIAGNOSIS — R4182 Altered mental status, unspecified: Secondary | ICD-10-CM | POA: Diagnosis present

## 2020-03-19 DIAGNOSIS — Z888 Allergy status to other drugs, medicaments and biological substances status: Secondary | ICD-10-CM

## 2020-03-19 DIAGNOSIS — R7989 Other specified abnormal findings of blood chemistry: Secondary | ICD-10-CM | POA: Diagnosis not present

## 2020-03-19 DIAGNOSIS — R7401 Elevation of levels of liver transaminase levels: Secondary | ICD-10-CM | POA: Diagnosis present

## 2020-03-19 DIAGNOSIS — N179 Acute kidney failure, unspecified: Secondary | ICD-10-CM | POA: Diagnosis present

## 2020-03-19 DIAGNOSIS — E875 Hyperkalemia: Secondary | ICD-10-CM | POA: Diagnosis present

## 2020-03-19 DIAGNOSIS — G9349 Other encephalopathy: Secondary | ICD-10-CM | POA: Diagnosis present

## 2020-03-19 DIAGNOSIS — W19XXXA Unspecified fall, initial encounter: Secondary | ICD-10-CM

## 2020-03-19 DIAGNOSIS — S0003XA Contusion of scalp, initial encounter: Secondary | ICD-10-CM | POA: Diagnosis present

## 2020-03-19 DIAGNOSIS — Z881 Allergy status to other antibiotic agents status: Secondary | ICD-10-CM | POA: Diagnosis not present

## 2020-03-19 DIAGNOSIS — I10 Essential (primary) hypertension: Secondary | ICD-10-CM | POA: Diagnosis present

## 2020-03-19 DIAGNOSIS — Z20822 Contact with and (suspected) exposure to covid-19: Secondary | ICD-10-CM | POA: Diagnosis present

## 2020-03-19 DIAGNOSIS — R400 Somnolence: Secondary | ICD-10-CM

## 2020-03-19 DIAGNOSIS — D649 Anemia, unspecified: Secondary | ICD-10-CM | POA: Diagnosis present

## 2020-03-19 DIAGNOSIS — Y92129 Unspecified place in nursing home as the place of occurrence of the external cause: Secondary | ICD-10-CM | POA: Diagnosis not present

## 2020-03-19 DIAGNOSIS — Z66 Do not resuscitate: Secondary | ICD-10-CM | POA: Diagnosis present

## 2020-03-19 DIAGNOSIS — Z515 Encounter for palliative care: Secondary | ICD-10-CM | POA: Diagnosis present

## 2020-03-19 DIAGNOSIS — Z882 Allergy status to sulfonamides status: Secondary | ICD-10-CM

## 2020-03-19 DIAGNOSIS — R410 Disorientation, unspecified: Secondary | ICD-10-CM | POA: Diagnosis not present

## 2020-03-19 LAB — CDS SEROLOGY

## 2020-03-19 MED ORDER — LORAZEPAM 2 MG/ML IJ SOLN
INTRAMUSCULAR | Status: AC
  Administered 2020-03-19: 2 mg
  Filled 2020-03-19: qty 1

## 2020-03-19 MED ORDER — PROTHROMBIN COMPLEX CONC HUMAN 500 UNITS IV KIT: 50.0000 [IU]/kg | PACK | Status: DC

## 2020-03-19 MED ORDER — LORAZEPAM 2 MG/ML IJ SOLN
INTRAMUSCULAR | Status: AC
  Filled 2020-03-19: qty 1

## 2020-03-19 MED ORDER — LEVETIRACETAM IN NACL 1000 MG/100ML IV SOLN
1000.0000 mg | Freq: Once | INTRAVENOUS | Status: AC
  Administered 2020-03-19: 1000 mg via INTRAVENOUS

## 2020-03-19 NOTE — ED Triage Notes (Signed)
Pt presents to ED BIB [facility] from SNF. Pt presents as LEVEL 1 trauma, fall. Per facility pt has 2 ground level falls and then AMS. Upon arrival pt seizing, L sided gaze, GCS: 6. Per Ems pt on eliquis.

## 2020-03-19 NOTE — ED Notes (Signed)
Pt's daughter placed all of pt's belongings into a bag from home. Before this RN transported pt to assigned room, daughter said that she would be taking the pt's belongings home with her for the night, and she left [facility] ED with pt's belongings.

## 2020-03-19 NOTE — H&P (Signed)
History and Physical   Amy T Truett AVW:[id number]:[id number] DOB: 1934-01-24 DOA: 03/19/2020  Referring MD/NP/PA: Dr. Silverio LayYao  PCP: Kari BaarsHawkins, Edward, MD   Outpatient Specialists: None  Patient coming from: Skilled facility  Chief Complaint: Fall with seizure  HPI: Amy Randall is a 86 y.o. adult with medical history significant of dementia, hypertension, who is a resident of skilled nursing facility that sustained 2 episodes of level 1 fall today and has become unresponsive.  She did have an observed seizure apparently.  Patient was brought in by EMS as a case of trauma.  On arrival she was also noted to have seizure.  Was treated with Ativan and subsequently Keppra.  Trauma team activated but patient is a DNR and currently has been on hospice care at the facility.  As such treatment is going to be more conservative.  Evaluation so far showed no intracranial bleed but she has hematoma in the forehead.  Patient is completely obtunded at this point not able to give any history.  She is being admitted mainly for observation with a half of patient returning to baseline prior to discharge back to facility..  ED Course: Temperature is 97.8 blood pressure 150/108 pulse 170 respirate 30 oxygen sat 94% room air.  White count 11.4 hemoglobin 10.6 and platelets 390.  Calcium 8.7.  INR is 1.2 glucose 142.  CT cervical spine CT head and CT maxillofacial showed no acute abnormalities.  She did have a hematoma on the anterior frontal area of the skull.  She is otherwise no obvious findings.  Chest x-ray shows*no new findings.  Patient being admitted for observation  Review of Systems: As per HPI otherwise 10 point review of systems negative.    History reviewed. No pertinent past medical history.  History reviewed. No pertinent surgical history.   has no history on file for tobacco use, alcohol use, and drug use.  Allergies  Allergen Reactions  . Aricept [Donepezil] Other (See Comments)    Unknown reaction  [facility]  . Bactrim [Sulfamethoxazole-Trimethoprim] Other (See Comments)    [facility] reaction - [facility]  . Citrus Other (See Comments)    [facility]  . Ibuprofen Other (See Comments)    Caused bruising  . Penicillins Other (See Comments)    [facility]  . Sulfa Antibiotics Other (See Comments)    "Loss of memory," per other chart in Epic  . Cefepime Rash and Other (See Comments)    "Possible rash developed after cefepime dose administered on pt's abdomen," per patient's other chart in Epic    History reviewed. No pertinent family history.   Prior to Admission medications   Not on File    Physical Exam: Vitals:   03/19/20 2301 03/19/20 2313 03/19/20 2330 03/20/20 0000  BP:  127/87 113/85 (!) 140/94  Pulse:  89 79 86  Resp:      Temp: (!) 97.5 F (36.4 C)   (!) 97 F (36.1 C)  TempSrc: Axillary   Axillary  SpO2:  99% 98% 100%  Weight:      Height:          Constitutional: Obtunded unresponsive,  forehead hematoma Vitals:   03/19/20 2301 03/19/20 2313 03/19/20 2330 03/20/20 0000  BP:  127/87 113/85 (!) 140/94  Pulse:  89 79 86  Resp:      Temp: (!) 97.5 F (36.4 C)   (!) 97 F (36.1 C)  TempSrc: Axillary   Axillary  SpO2:  99% 98% 100%  Weight:      Height:       Eyes: PERRL, lids and conjunctivae normal ENMT: Mucous membranes are moist. Posterior pharynx clear of any exudate or lesions.Normal dentition.  Neck: normal, supple, no masses, no thyromegaly Respiratory: clear to auscultation bilaterally, no wheezing, no crackles. Normal respiratory effort. No accessory muscle use.  Cardiovascular: Regular rate and rhythm, no murmurs / rubs / gallops. No extremity edema. 2+ pedal pulses. No carotid bruits.  Abdomen: no tenderness, no masses palpated. No hepatosplenomegaly. Bowel sounds positive.  Musculoskeletal: no clubbing / cyanosis. No joint deformity upper and lower  extremities. Good ROM, no contractures. Normal muscle tone.  Skin: no rashes, lesions, ulcers. No induration Neurologic: CN 2-12 grossly intact. Sensation intact, DTR normal. Strength 5/5 in all 4.  Obtunded, withdrawing all 4 limbs Psychiatric: Completely obtunded and unresponsive    Labs on Admission: I have personally reviewed following labs and imaging studies  CBC: Recent Labs  Lab 03/19/20 1927  WBC 11.4*  HGB 10.6*  HCT 35.2*  MCV 87.3  PLT 392   Basic Metabolic Panel: Recent Labs  Lab 03/19/20 1927  NA 136  K 4.3  CL 101  CO2 23  GLUCOSE 142*  BUN 20  CREATININE 0.87  CALCIUM 8.7*   GFR: Estimated Creatinine Clearance (by C-G formula based on SCr of 0.87 mg/dL) Female: 62.1 mL/min Female: 44.7 mL/min Liver Function Tests: Recent Labs  Lab 03/19/20 1927  AST 20  ALT 15  ALKPHOS 140*  BILITOT 0.7  PROT 5.8*  ALBUMIN 3.0*   No results for input(s): LIPASE, AMYLASE in the last 168 hours. No results for input(s): AMMONIA in the last 168 hours. Coagulation Profile: Recent Labs  Lab 03/19/20 1927  INR 1.2   Cardiac Enzymes: No results for input(s): CKTOTAL, CKMB, CKMBINDEX, TROPONINI in the last 168 hours. BNP (last 3 results) No results for input(s): PROBNP in the last 8760 hours. HbA1C: No results for input(s): HGBA1C in the last 72 hours. CBG: No results for input(s): GLUCAP in the last 168 hours. Lipid Profile: No results for input(s): CHOL, HDL, LDLCALC, TRIG, CHOLHDL, LDLDIRECT in the last 72 hours. Thyroid Function Tests: No results for input(s): TSH, T4TOTAL, FREET4, T3FREE, THYROIDAB in the last 72 hours. Anemia Panel: No results for input(s): VITAMINB12, FOLATE, FERRITIN, TIBC, IRON, RETICCTPCT in the last 72 hours. Urine analysis: No results found for: COLORURINE, APPEARANCEUR, LABSPEC, PHURINE, GLUCOSEU, HGBUR, BILIRUBINUR, KETONESUR, PROTEINUR, UROBILINOGEN, NITRITE, LEUKOCYTESUR Sepsis  Labs: @LABRCNTIP (procalcitonin:4,lacticidven:4) ) Recent Results (from the past 240 hour(s))  SARS Coronavirus 2 by RT PCR (hospital order, performed in [facility] hospital lab) Nasopharyngeal Nasopharyngeal Swab     Status: None   Collection Time: 03/19/20  7:20 PM   Specimen: Nasopharyngeal Swab  Result Value Ref Range Status   SARS Coronavirus 2 NEGATIVE NEGATIVE Final    Comment: (NOTE) SARS-CoV-2 target nucleic acids are NOT DETECTED.  The SARS-CoV-2 RNA is generally detectable in upper and lower respiratory specimens during the acute phase of infection. The lowest concentration of SARS-CoV-2 viral copies this assay can detect is 250 copies / mL. A negative result does not preclude SARS-CoV-2 infection and should not be used as the sole basis for treatment or other patient management decisions.  A negative result may occur with improper specimen collection / handling, submission of specimen other than nasopharyngeal swab, presence of viral mutation(s) within the areas targeted by this assay, and inadequate number  of viral copies (<250 copies / mL). A negative result must be combined with clinical observations, patient history, and epidemiological information.  Fact Sheet for Patients:   BoilerBrush.com.cy  Fact Sheet for Healthcare Providers: https://pope.com/  This test is not yet approved or  cleared by the [place] FDA and has been authorized for detection and/or diagnosis of SARS-CoV-2 by FDA under an Emergency Use Authorization (EUA).  This EUA will remain in effect (meaning this test can be used) for the duration of the COVID-19 declaration under Section 564(b)(1) of the Act, 21 U.S.C. section 360bbb-3(b)(1), unless the authorization is terminated or revoked sooner.  Performed at [facility], [address] [place], [place] [address]      Radiological Exams on Admission: CT Head Wo Contrast  Result  Date: 03/19/2020 CLINICAL DATA:  Fall on blood thinners. Level 1 trauma. Facial trauma. EXAM: CT HEAD WITHOUT CONTRAST TECHNIQUE: Contiguous axial images were obtained from the base of the skull through the vertex without intravenous contrast. COMPARISON:  Head CT 09/08/2019 FINDINGS: Brain: Stable degree of atrophy and chronic small vessel ischemia. No intracranial hemorrhage, mass effect, or midline shift. No hydrocephalus. The basilar cisterns are patent. Remote right occipital infarct. Remote lacunar infarcts in the basal ganglia and right greater than left cerebellum are stable. No evidence of territorial infarct or acute ischemia. No extra-axial or intracranial fluid collection. Vascular: Atherosclerosis of skullbase vasculature without hyperdense vessel or abnormal calcification. Skull: No fracture or focal lesion. Sinuses/Orbits: Assessed on concurrent face CT. Other: Left frontal scalp hematoma. IMPRESSION: 1. Left frontal scalp hematoma. No acute intracranial abnormality. No skull fracture. 2. Stable atrophy and chronic small vessel ischemia. Remote infarcts unchanged from prior exam. Electronically Signed   By: Narda Rutherford M.D.   On: 03/19/2020 19:50   CT Cervical Spine Wo Contrast  Result Date: 03/19/2020 CLINICAL DATA:  Fall.  Level 1 trauma.  On anticoagulation. EXAM: CT CERVICAL SPINE WITHOUT CONTRAST TECHNIQUE: Multidetector CT imaging of the cervical spine was performed without intravenous contrast. Multiplanar CT image reconstructions were also generated. COMPARISON:  Cervical spine CT 09/08/2019. FINDINGS: Alignment: Unchanged reversal of normal lordosis. Stable trace anterolisthesis of C2 on C3, C4 on C5, and C7 on T1. No traumatic subluxation. No jumped or perched facets. Skull base and vertebrae: No acute fracture. Vertebral body heights are maintained. The dens and skull base are intact. Soft tissues and spinal canal: No prevertebral fluid or swelling. No visible canal hematoma. Disc  levels: Disc space narrowing and endplate spurring with near complete disc space loss at C3-C4, stable. Additional multilevel degenerative disc disease. Prominent multilevel facet hypertrophy. Degenerative changes are stable from prior. Upper chest: Layering left pleural effusion, partially included. Stable heterogeneously enlarged left lobe of the thyroid gland, partially obscured by motion on the current exam. Previously suggestion of thyroid nodules are obscured. Thyroid ultrasound could be considered, however recommend correlation with patient's comorbidities and life expectancy. Other: Carotid calcifications. IMPRESSION: 1. No acute fracture or subluxation of the cervical spine. 2. Multilevel degenerative disc disease and facet hypertrophy, unchanged from prior. 3. Layering left pleural effusion, partially included. 4. Stable heterogeneously enlarged left lobe of the thyroid gland, partially obscured by motion on the current exam. Previously suggestion of thyroid nodules are obscured. Thyroid ultrasound could be considered, however recommend correlation with patient's comorbidities and life expectancy. Electronically Signed   By: Narda Rutherford M.D.   On: 03/19/2020 20:00   DG Pelvis Portable  Result Date: 03/19/2020 CLINICAL DATA:  Trauma.  EXAM: PORTABLE PELVIS 1-2 VIEWS COMPARISON:  None. FINDINGS: Intramedullary nail with trans trochanteric screw fixation of remote proximal femur fracture. There is adjacent heterotopic calcification. No evidence of acute pelvis fracture. The cortical margins appear intact. The pubic symphysis and sacroiliac joints are congruent. The bones are under mineralized. IMPRESSION: 1. No evidence of acute pelvis fracture. 2. Remote postsurgical change of the proximal femur with heterotopic calcification. Electronically Signed   By: Narda Rutherford M.D.   On: 03/19/2020 20:04   DG Chest Portable 1 View  Result Date: 03/19/2020 CLINICAL DATA:  Trauma.  Fall. EXAM: PORTABLE  CHEST 1 VIEW COMPARISON:  Radiograph and CT 12/06/2019 FINDINGS: Stable cardiomegaly. Stable mediastinal contours. Aortic atherosclerosis. Moderate left pleural effusion with basilar opacity, increased from prior exams. Vascular congestion. No pneumothorax. Right lower lateral rib fractures tentatively ribs 8 and 9 are of uncertain acuity, previously not well evaluated on CT due to motion, and not included in the field of view. IMPRESSION: 1. Chronic cardiomegaly. Moderate left pleural effusion with basilar opacity. Pleural effusion is seen radiograph and CT and may, but increased from prior. 2. Right lower lateral rib fractures (tentatively ribs 8 and 9) are of uncertain acuity, previously not well evaluated on CT. No pneumothorax or evidence of pulmonary complication. Electronically Signed   By: Narda Rutherford M.D.   On: 03/19/2020 20:03   CT Maxillofacial Wo Contrast  Result Date: 03/19/2020 CLINICAL DATA:  Facial trauma.  Fall.  Level 1 EXAM: CT MAXILLOFACIAL WITHOUT CONTRAST TECHNIQUE: Multidetector CT imaging of the maxillofacial structures was performed. Multiplanar CT image reconstructions were also generated. COMPARISON:  None. FINDINGS: Osseous: Mild motion artifact. Suspected motion artifact through the left nasal bone, difficult to exclude anterior nasal bone fracture. No definite associated soft tissue edema. Minor rightward nasal septal bowing. Zygomatic arches and mandibles are intact. The temporomandibular joints are congruent with degenerative change. There scattered dental caries. Orbits: No orbital fracture.  Both orbits and globes are intact. Sinuses: No sinus fracture or fluid level. Small mucous retention cysts in both maxillary sinuses. Soft tissues: Left frontal and periorbital scalp hematoma. Limited intracranial: Assessed on concurrent head CT, reported separately. IMPRESSION: 1. Motion artifact through the left nasal bone, difficult to exclude anterior nasal bone fracture given  artifact in this region. No definite associated soft tissue edema. No other facial bone fracture. 2. Left frontal and periorbital scalp hematoma. No orbital fracture. Electronically Signed   By: Narda Rutherford M.D.   On: 03/19/2020 19:54      Assessment/Plan Principal Problem:   AMS (altered mental status) Active Problems:   Seizure (HCC)   Fall at home, initial encounter     #1 altered mental status: Combination of post ictal and medications.  Observe overnight.  Hopefully start patient will be back to her baseline when she is able to communicate and walk with a [place].  She is still going to resume her hospice care at discharge.  #2 seizure: No history of seizure disorder.  Given a dose of Keppra.  May not require continuation of treatment as this could be traumatic in nature.  No plan for EEG or any other neurologic evaluation at this point  #3 dementia: Once patient is awake will resume home regimen.  #4 recurrent falls: Could be mechanical fall followed by seizure or actual seizure leading to the 2 falls.  Patient will be observed closely.  #5 hypertension: Continue monitoring blood pressure   DVT prophylaxis: SCD Code Status: DNR Family Communication: Daughter Disposition Plan:  Back to skilled facility with hospice care Consults called: Trauma surgery in the ER only Admission status: Observation  Severity of Illness: The appropriate patient status for this patient is OBSERVATION. Observation status is judged to be reasonable and necessary in order to provide the required intensity of service to ensure the patient's safety. The patient's presenting symptoms, physical exam findings, and initial radiographic and laboratory data in the context of their medical condition is felt to place them at decreased risk for further clinical deterioration. Furthermore, it is anticipated that the patient will be medically stable for discharge from the hospital within 2 midnights of admission.  The following factors support the patient status of observation.   " The patient's presenting symptoms include altered mental status after a fall with seizure. " The physical exam findings include complete obtundation. " The initial radiographic and laboratory data are no obvious intracranial abnormalities.     Danielle Mink,LAWAL MD [facility] BloodPager [phone]  If 7PM-7AM, please contact night-coverage [email or web address] Password [facility]  03/20/2020, 12:09 AM

## 2020-03-19 NOTE — ED Notes (Signed)
This RN attempted to call report to admitting floor (2W) and was told that the nurse on 2W would call back within the next 5 minutes. This RN reminded the nurse on 2W that it has been nearly 30 minutes since the pt was assigned a bed, which he acknowledged and repeated himself, saying that he will return the call for report within the next 5 minutes.

## 2020-03-19 NOTE — ED Notes (Signed)
[facility] [place]-- [phone]

## 2020-03-19 NOTE — Progress Notes (Signed)
Patient ID: Amy Randall, adult   DOB: March 13, 1934, 86 y.o.   MRN: [id number]  I responded to this level 1 trauma code for an elderly patient s/p two falls at her nursing home today, now with decreased GCS.  She seemed to have some seizure activity upon arrival that responded to Ativan.  Upon further investigation, she is DNR and is on hospice care.    There does not seem to be any need for Trauma Surgery involvement in this patient's care.  Wilmon Arms. Corliss Skains, MD, [facility] Surgery  General/ Trauma Surgery   03/19/2020 7:46 PM

## 2020-03-19 NOTE — Progress Notes (Signed)
Chaplain responded to Level 1 in Trauma A. Patient not available. No family here at present. Will continue to be available as needed. Rev. Lynnell Chad Pager [phone]

## 2020-03-19 NOTE — ED Provider Notes (Signed)
[facility] EMERGENCY DEPARTMENT Provider Note   CSN: [id number] Arrival date & time: 03/19/20  1910     History Chief Complaint  Patient presents with  . Fall  . LEVEL 1    Amy Randall is a 86 y.o. adult.  The history is provided by the patient.  Fall This is a new problem. The current episode started less than 1 hour ago. The problem occurs rarely. The problem has not changed since onset.Nothing aggravates the symptoms. Nothing relieves the symptoms. Amy Randall has tried nothing for the symptoms. The treatment provided no relief.       History reviewed. No pertinent past medical history.  Patient Active Problem List   Diagnosis Date Noted  . AMS (altered mental status) 03/19/2020  . Seizure (HCC) 03/19/2020  . Fall at home, initial encounter 03/19/2020    History reviewed. No pertinent surgical history.   OB History   No obstetric history on file.     History reviewed. No pertinent family history.  Social History   Tobacco Use  . Smoking status: Never Smoker  . Smokeless tobacco: Never Used  Substance Use Topics  . Alcohol use: Not on file  . Drug use: Not on file    Home Medications Prior to Admission medications   Medication Sig Start Date End Date Taking? Authorizing Provider  acetaminophen (TYLENOL) 325 MG tablet Take 650 mg by mouth 3 (three) times daily.   Yes [provider]  aspirin EC 81 MG tablet Take 81 mg by mouth daily. Swallow whole.   Yes [provider]  diltiazem (CARDIZEM CD) 360 MG 24 hr capsule Take 360 mg by mouth daily.   Yes [provider]  Ensure (ENSURE) Take 237 mLs by mouth 3 (three) times daily.   Yes [provider]  methimazole (TAPAZOLE) 5 MG tablet Take 5 mg by mouth daily.   Yes [provider]  metoprolol tartrate (LOPRESSOR) 25 MG tablet Take 25 mg by mouth 2 (two) times daily.   Yes [provider]  morphine (ROXANOL) 20 MG/ML  concentrated solution Take 5 mg by mouth 3 (three) times daily.   Yes [provider]  rivastigmine (EXELON) 13.3 MG/24HR Place 13.3 mg onto the skin daily.   Yes [provider]    Allergies    Aricept [donepezil], Bactrim [sulfamethoxazole-trimethoprim], Citrus, Ibuprofen, Penicillins, Sulfa antibiotics, and Cefepime  Review of Systems   Review of Systems  Unable to perform ROS: Mental status change    Physical Exam Updated Vital Signs BP (!) 122/92   Pulse 89   Temp (!) 97 F (36.1 C)   Resp 19   Ht 5' (1.524 m)   Wt 54.4 kg   SpO2 100%   BMI 23.44 kg/m   Physical Exam Vitals and nursing note reviewed.  Constitutional:      Comments: Extensive ecchymosis and swelling to the L face.  Eyes:     Comments: L sided gaze w/ rhythmic eye movements.  Cardiovascular:     Rate and Rhythm: Normal rate.     Pulses: Normal pulses.     Heart sounds: Normal heart sounds.  Pulmonary:     Effort: Pulmonary effort is normal.     Breath sounds: Normal breath sounds.  Abdominal:     General: There is no distension.     Tenderness: There is no guarding.  Musculoskeletal:        General: No swelling, deformity or signs of injury.  Neurological:     Comments: Minimally responsive.  Psychiatric:     Comments: Minimally responsive.     ED Results / Procedures / Treatments   Labs (all labs ordered are listed, but only abnormal results are displayed) Labs Reviewed  COMPREHENSIVE METABOLIC PANEL - Abnormal; Notable for the following components:      Result Value   Glucose, Bld 142 (*)    Calcium 8.7 (*)    Total Protein 5.8 (*)    Albumin 3.0 (*)    Alkaline Phosphatase 140 (*)    All other components within normal limits  CBC - Abnormal; Notable for the following components:   WBC 11.4 (*)    RBC 4.03 (*)    Hemoglobin 10.6 (*)    HCT 35.2 (*)    RDW 15.6 (*)    All other components within normal limits  CBC - Abnormal; Notable for the following  components:   RBC 3.77 (*)    Hemoglobin 9.9 (*)    HCT 33.3 (*)    MCHC 29.7 (*)    All other components within normal limits  COMPREHENSIVE METABOLIC PANEL - Abnormal; Notable for the following components:   Calcium 8.3 (*)    Total Protein 5.2 (*)    Albumin 2.6 (*)    All other components within normal limits  SARS CORONAVIRUS 2 BY RT PCR (HOSPITAL ORDER, PERFORMED IN [facility])  CDS SEROLOGY  ETHANOL  PROTIME-INR    EKG None  Radiology CT Head Wo Contrast  Result Date: 03/19/2020 CLINICAL DATA:  Fall on blood thinners. Level 1 trauma. Facial trauma. EXAM: CT HEAD WITHOUT CONTRAST TECHNIQUE: Contiguous axial images were obtained from the base of the skull through the vertex without intravenous contrast. COMPARISON:  Head CT 09/08/2019 FINDINGS: Brain: Stable degree of atrophy and chronic small vessel ischemia. No intracranial hemorrhage, mass effect, or midline shift. No hydrocephalus. The basilar cisterns are patent. Remote right occipital infarct. Remote lacunar infarcts in the basal ganglia and right greater than left cerebellum are stable. No evidence of territorial infarct or acute ischemia. No extra-axial or intracranial fluid collection. Vascular: Atherosclerosis of skullbase vasculature without hyperdense vessel or abnormal calcification. Skull: No fracture or focal lesion. Sinuses/Orbits: Assessed on concurrent face CT. Other: Left frontal scalp hematoma. IMPRESSION: 1. Left frontal scalp hematoma. No acute intracranial abnormality. No skull fracture. 2. Stable atrophy and chronic small vessel ischemia. Remote infarcts unchanged from prior exam. Electronically Signed   By: Narda Rutherford M.D.   On: 03/19/2020 19:50   CT Cervical Spine Wo Contrast  Result Date: 03/19/2020 CLINICAL DATA:  Fall.  Level 1 trauma.  On anticoagulation. EXAM: CT CERVICAL SPINE WITHOUT CONTRAST TECHNIQUE: Multidetector CT imaging of the cervical spine was performed without intravenous  contrast. Multiplanar CT image reconstructions were also generated. COMPARISON:  Cervical spine CT 09/08/2019. FINDINGS: Alignment: Unchanged reversal of normal lordosis. Stable trace anterolisthesis of C2 on C3, C4 on C5, and C7 on T1. No traumatic subluxation. No jumped or perched facets. Skull base and vertebrae: No acute fracture. Vertebral body heights are maintained. The dens and skull base are intact. Soft tissues and spinal canal: No prevertebral fluid or swelling. No visible canal hematoma. Disc levels: Disc space narrowing and endplate spurring with near complete disc space loss at C3-C4, stable. Additional multilevel degenerative disc disease. Prominent multilevel facet hypertrophy. Degenerative changes are stable from prior. Upper chest: Layering left pleural effusion, partially included. Stable heterogeneously enlarged left lobe of the thyroid gland,  partially obscured by motion on the current exam. Previously suggestion of thyroid nodules are obscured. Thyroid ultrasound could be considered, however recommend correlation with patient's comorbidities and life expectancy. Other: Carotid calcifications. IMPRESSION: 1. No acute fracture or subluxation of the cervical spine. 2. Multilevel degenerative disc disease and facet hypertrophy, unchanged from prior. 3. Layering left pleural effusion, partially included. 4. Stable heterogeneously enlarged left lobe of the thyroid gland, partially obscured by motion on the current exam. Previously suggestion of thyroid nodules are obscured. Thyroid ultrasound could be considered, however recommend correlation with patient's comorbidities and life expectancy. Electronically Signed   By: Narda RutherfordMelanie  Sanford M.D.   On: 03/19/2020 20:00   DG Pelvis Portable  Result Date: 03/19/2020 CLINICAL DATA:  Trauma. EXAM: PORTABLE PELVIS 1-2 VIEWS COMPARISON:  None. FINDINGS: Intramedullary nail with trans trochanteric screw fixation of remote proximal femur fracture. There is  adjacent heterotopic calcification. No evidence of acute pelvis fracture. The cortical margins appear intact. The pubic symphysis and sacroiliac joints are congruent. The bones are under mineralized. IMPRESSION: 1. No evidence of acute pelvis fracture. 2. Remote postsurgical change of the proximal femur with heterotopic calcification. Electronically Signed   By: Narda RutherfordMelanie  Sanford M.D.   On: 03/19/2020 20:04   DG Chest Portable 1 View  Result Date: 03/19/2020 CLINICAL DATA:  Trauma.  Fall. EXAM: PORTABLE CHEST 1 VIEW COMPARISON:  Radiograph and CT 12/06/2019 FINDINGS: Stable cardiomegaly. Stable mediastinal contours. Aortic atherosclerosis. Moderate left pleural effusion with basilar opacity, increased from prior exams. Vascular congestion. No pneumothorax. Right lower lateral rib fractures tentatively ribs 8 and 9 are of uncertain acuity, previously not well evaluated on CT due to motion, and not included in the field of view. IMPRESSION: 1. Chronic cardiomegaly. Moderate left pleural effusion with basilar opacity. Pleural effusion is seen radiograph and CT and may, but increased from prior. 2. Right lower lateral rib fractures (tentatively ribs 8 and 9) are of uncertain acuity, previously not well evaluated on CT. No pneumothorax or evidence of pulmonary complication. Electronically Signed   By: Narda RutherfordMelanie  Sanford M.D.   On: 03/19/2020 20:03   CT Maxillofacial Wo Contrast  Result Date: 03/19/2020 CLINICAL DATA:  Facial trauma.  Fall.  Level 1 EXAM: CT MAXILLOFACIAL WITHOUT CONTRAST TECHNIQUE: Multidetector CT imaging of the maxillofacial structures was performed. Multiplanar CT image reconstructions were also generated. COMPARISON:  None. FINDINGS: Osseous: Mild motion artifact. Suspected motion artifact through the left nasal bone, difficult to exclude anterior nasal bone fracture. No definite associated soft tissue edema. Minor rightward nasal septal bowing. Zygomatic arches and mandibles are intact. The  temporomandibular joints are congruent with degenerative change. There scattered dental caries. Orbits: No orbital fracture.  Both orbits and globes are intact. Sinuses: No sinus fracture or fluid level. Small mucous retention cysts in both maxillary sinuses. Soft tissues: Left frontal and periorbital scalp hematoma. Limited intracranial: Assessed on concurrent head CT, reported separately. IMPRESSION: 1. Motion artifact through the left nasal bone, difficult to exclude anterior nasal bone fracture given artifact in this region. No definite associated soft tissue edema. No other facial bone fracture. 2. Left frontal and periorbital scalp hematoma. No orbital fracture. Electronically Signed   By: Narda RutherfordMelanie  Sanford M.D.   On: 03/19/2020 19:54    Procedures Procedures (including critical care time)  Medications Ordered in ED Medications  ondansetron (ZOFRAN) tablet 4 mg (has no administration in time range)    Or  ondansetron (ZOFRAN) injection 4 mg (has no administration in time range)  methimazole (TAPAZOLE) tablet  5 mg (5 mg Oral Not Given 03/20/20 1033)  metoprolol tartrate (LOPRESSOR) tablet 25 mg (25 mg Oral Not Given 03/20/20 1034)  levETIRAcetam (KEPPRA) tablet 250 mg (250 mg Oral Not Given 03/20/20 1033)  dextrose 5 % and 0.9 % NaCl with KCl 20 mEq/L infusion ( Intravenous New Bag/Given 03/20/20 1122)  LORazepam (ATIVAN) 2 MG/ML injection (2 mg  Given 03/19/20 1910)  levETIRAcetam (KEPPRA) IVPB 1000 mg/100 mL premix (0 mg Intravenous Stopped 03/19/20 1942)  LORazepam (ATIVAN) 2 MG/ML injection (  Given by Other 03/19/20 1940)    ED Course  I have reviewed the triage vital signs and the nursing notes.  Pertinent labs & imaging results that were available during my care of the patient were reviewed by me and considered in my medical decision making (see chart for details).    MDM Rules/Calculators/A&P                          86 year old female with history of dementia presents as a level 1  trauma after a fall.  Patient had approximately 2 falls today from her nursing facility who presented with EMS with a GCS of 6.  Patient was minimally responsive upon arrival to the emergency department and had extensive bruising to the left side of her face.  Patient was unable to follow commands due to her altered mental status.  Afebrile vital signs stable.  Exam as above.  Exam most notable for extensive bruising to the left side of patient's face as well as rhythmic eye movements concerning for seizures.  Patient was given 2 mg of Ativan as well as a gram of Keppra for possible seizure-like activity.  Patient had continued seizure-like activity after this administration requiring additional 2 mg of Ativan.  No other focal signs of trauma on physical exam.  CT scan showed no acute intracranial bleed or other signs of trauma.  Patient was transported with a DNR order and after further chart review was discovered that patient is a hospice patient.  I contacted patient's family and they reiterated that patient was a hospice patient did not want to be ventilator or have any other resuscitative measures.    Repeat evaluation patient very lethargic and difficult to arouse.  No further rhythmic activity was noted on physical exam.  Possible that patient seizure-like activity was posttraumatic in nature.  I was contacted by the hospice nurse who stated that patient would not be able to go back to her nursing facility at this time given her altered mental status.  I was able to speak with patient's daughter at the bedside and advised that patient will likely quire admission to her mental status improved that she can be discharged back to her nursing home.  Patient's daughter agreed with this and it was decided to between me and patient's daughter that patient would not receive any aggressive medical care during her stay in the hospital.  I spoke with hospitalist regarding admission for this patient and reiterated the  hospitalist that patient's family did not want any aggressive medical care while in the hospital.  Hospitalist evaluated patient agree with admission to their service.  Patient was admitted to the hospitalist service in stable condition without further events. Final Clinical Impression(s) / ED Diagnoses Final diagnoses:  Seizure [facility])    Rx / DC Orders ED Discharge Orders    None       Rickey Primus, MD 03/20/20 1202    Silverio Lay,  Gonzella Lex, MD 03/23/20 [phone]

## 2020-03-19 NOTE — Progress Notes (Signed)
Orthopedic Tech Progress Note Patient Details:  Amy Randall 08/07/1873 [id number] Level 1 Trauma Patient ID: Amy Randall, adult   DOB: 08/07/1873, 86 y.o.   MRN: [id number]   Gerald Stabs 03/19/2020, 7:20 PM

## 2020-03-20 ENCOUNTER — Encounter (HOSPITAL_COMMUNITY): Payer: Self-pay | Admitting: Internal Medicine

## 2020-03-20 MED ORDER — KCL IN DEXTROSE-NACL 20-5-0.9 MEQ/L-%-% IV SOLN
INTRAVENOUS | Status: DC
  Filled 2020-03-20 (×4): qty 1000

## 2020-03-20 MED ORDER — ONDANSETRON HCL 4 MG PO TABS: 4.0000 mg | ORAL_TABLET | Freq: Four times a day (QID) | ORAL | Status: DC | PRN

## 2020-03-20 MED ORDER — ONDANSETRON HCL 4 MG/2ML IJ SOLN
4.0000 mg | Freq: Four times a day (QID) | INTRAMUSCULAR | Status: DC | PRN
  Administered 2020-03-21: 4 mg via INTRAVENOUS
  Filled 2020-03-20: qty 2

## 2020-03-20 MED ORDER — METHIMAZOLE 5 MG PO TABS: 5.0000 mg | ORAL_TABLET | Freq: Every day | ORAL | Status: DC

## 2020-03-20 MED ORDER — METOPROLOL TARTRATE 25 MG PO TABS: 25.0000 mg | ORAL_TABLET | Freq: Two times a day (BID) | ORAL | Status: DC

## 2020-03-20 MED ORDER — LEVETIRACETAM 250 MG PO TABS
250.0000 mg | ORAL_TABLET | Freq: Two times a day (BID) | ORAL | Status: DC
  Filled 2020-03-20 (×2): qty 1

## 2020-03-20 MED ORDER — LEVETIRACETAM IN NACL 500 MG/100ML IV SOLN
500.0000 mg | Freq: Two times a day (BID) | INTRAVENOUS | Status: DC
  Administered 2020-03-20 – 2020-03-22 (×4): 500 mg via INTRAVENOUS
  Filled 2020-03-20 (×5): qty 100

## 2020-03-20 MED ORDER — ACETAMINOPHEN 650 MG RE SUPP
650.0000 mg | Freq: Four times a day (QID) | RECTAL | Status: DC | PRN
  Administered 2020-03-20: 650 mg via RECTAL
  Filled 2020-03-20: qty 1

## 2020-03-20 MED ORDER — METOPROLOL TARTRATE 5 MG/5ML IV SOLN
2.5000 mg | Freq: Four times a day (QID) | INTRAVENOUS | Status: DC
  Administered 2020-03-20 – 2020-03-21 (×3): 2.5 mg via INTRAVENOUS
  Filled 2020-03-20 (×3): qty 5

## 2020-03-20 NOTE — Progress Notes (Signed)
Amy Randall  WVP:[id number] DOB: Apr 23, 1934 DOA: 03/19/2020 PCP: [name], MD    Brief Narrative:  86 year old SNF resident with a history of dementia and HTN who presented after 2 falls in her facility followed by unresponsiveness. She was felt to have had an observed seizure. Upon arrival via EMS she was observed to have a seizure in the ED. She was dosed with Ativan and Keppra. She is presently on hospice care and a DNR  Significant Events:  8/13 admit via [place]  Subjective: Vital signs are stable and the patient is afebrile.  The patient remains obtunded.  She is in no respiratory distress.  There is no evidence of uncontrolled pain.  Antimicrobials:  None  DVT prophylaxis: SCDs  Assessment & Plan:  Seizure - postictal encephalopathy CT head without acute findings apart from a left scalp hematoma -no prior history of seizure activity -difficult to tell if the patient's falls were related to seizures or if repetitive trauma to the left frontal lobe helped precipitate the seizures -continue low-dose Keppra for now -monitor postictal state -supportive care until patient begins to wake up  Dementia Evidence of such confirmed on CT head -fully obtunded at present  Recurrent falls Likely related to seizure activity - PT and OT as patient recovers  HTN Blood pressure well controlled  Normocytic anemia Likely due to poor nutritional state -follow trend  Code Status: NO CODE BLUE Family Communication:  Status is: Inpatient  Remains inpatient appropriate because:Inpatient level of care appropriate due to severity of illness   Dispo: The patient is from: SNF              Anticipated d/c is to: SNF              Anticipated d/c date is: 2 days              Patient currently is not medically stable to d/c.  Consultants:  none  Objective: Blood pressure (!) 122/92, pulse 89, temperature (!) 97 F (36.1 C), resp. rate 19, height 5' (1.524 m), weight 54.4 kg,  SpO2 100 %.  Intake/Output Summary (Last 24 hours) at 03/20/2020 0910 Last data filed at 03/20/2020 0800 Gross per 24 hour  Intake 0 ml  Output 0 ml  Net 0 ml   Filed Weights   03/19/20 2059  Weight: 54.4 kg    Examination: General: No acute respiratory distress -obtunded -large left frontal hematoma (approximate size of a golf ball) Lungs: Clear to auscultation bilaterally without wheezes or crackles Cardiovascular: Regular rate and rhythm without murmur gallop or rub normal S1 and S2 Abdomen: Nontender, nondistended, soft, bowel sounds positive, no rebound, no ascites, no appreciable mass Extremities: No significant cyanosis, clubbing, or edema bilateral lower extremities  CBC: Recent Labs  Lab 03/19/20 1927 03/20/20 0029  WBC 11.4* 7.4  HGB 10.6* 9.9*  HCT 35.2* 33.3*  MCV 87.3 88.3  PLT 392 247   Basic Metabolic Panel: Recent Labs  Lab 03/19/20 1927 03/20/20 0029  NA 136 137  K 4.3 3.5  CL 101 104  CO2 23 25  GLUCOSE 142* 80  BUN 20 19  CREATININE 0.87 0.88  CALCIUM 8.7* 8.3*   GFR: Estimated Creatinine Clearance (by C-G formula based on SCr of 0.88 mg/dL) Female: 54.6 mL/min Female: 44.2 mL/min  Liver Function Tests: Recent Labs  Lab 03/19/20 1927 03/20/20 0029  AST 20 15  ALT 15 12  ALKPHOS 140* 120  BILITOT 0.7 0.6  PROT 5.8*  5.2*  ALBUMIN 3.0* 2.6*    Coagulation Profile: Recent Labs  Lab 03/19/20 1927  INR 1.2    Recent Results (from the past 240 hour(s))  SARS Coronavirus 2 by RT PCR (hospital order, performed in [facility] hospital lab) Nasopharyngeal Nasopharyngeal Swab     Status: None   Collection Time: 03/19/20  7:20 PM   Specimen: Nasopharyngeal Swab  Result Value Ref Range Status   SARS Coronavirus 2 NEGATIVE NEGATIVE Final    Comment: QA FLAGS AND/OR RANGES MODIFIED BY DEMOGRAPHIC UPDATE ON 08/14 AT 0014 QA FLAGS AND/OR RANGES MODIFIED BY DEMOGRAPHIC UPDATE ON 08/14 AT 0338 (NOTE) SARS-CoV-2 target nucleic acids are NOT  DETECTED.  The SARS-CoV-2 RNA is generally detectable in upper and lower respiratory specimens during the acute phase of infection. The lowest concentration of SARS-CoV-2 viral copies this assay can detect is 250 copies / mL. A negative result does not preclude SARS-CoV-2 infection and should not be used as the sole basis for treatment or other patient management decisions.  A negative result may occur with improper specimen collection / handling, submission of specimen other than nasopharyngeal swab, presence of viral mutation(s) within the areas targeted by this assay, and inadequate number of viral copies (<250 copies / mL). A negative result must be combined with clinical observations, patient history, and epidemiological information.  Fact Sheet for Patients:   https://www.fda. gov/media/136312/download  Fact Sheet for Healthcare Providers: https://pope.com/  This test is not yet approved or cleared by the [place] FDA and has been authorized for detection and/or diagnosis of SARS-CoV-2 by FDA under an Emergency Use Authorization (EUA).  This EUA will remain in effect (meaning this test can be used) for the duration of the COVID-19 declaration under Section 564(b)(1) of the Act, 21 U.S.C. section 360bbb-3(b)(1), unless the authorization is terminated or revoked sooner.  Performed at [facility], [address], [place], [place] [address]      Scheduled Meds: Continuous Infusions:   LOS: 1 day   Lonia Blood, MD [facility] Office  [phone] Pager - Text Page per Amion  If 7PM-7AM, please contact night-coverage per Amion 03/20/2020, 9:10 AM

## 2020-03-20 NOTE — Progress Notes (Signed)
[place] 2W05 [facility]) Hospitalized Hospice Patient  Amy Randall is a hospice patient with [facility], terminal diagnosis of cerebrovascular disease.  She has had multiple falls at her facility over the last few weeks, two being yesterday. After her second fall, staff report she was lethargic and possibly having a seizure. EMS was activated and she was transported to the ED for further evaluation. She is now postictal and unresponsive, admitted for new onset seizure activity. This is a related hospital admission.  Met with patient and daughter, Amy Randall who is a Therapist, sports, at the bedside. Pt is not responsive. Amy Randall reports her mother has been turning her head towards her when she speaks. At baseline, she is demented, she can speak but does not know who Amy Randall is. She is in memory care at [facility] and is mostly in her wheelchair, but she can still walk.   V/S:  97 oral, 122/92, HR 89, RR 19, SPO2 100% on [place] @ 4 lpm I&O: none recorded, Amy Randall states she was concerned about her mother being dehydrated and requested IVFs to be started Labs:  Albumin 2.6, total protein 5.2, hgb 9.9, HCT 33.3 Diagnostics:  No acute findings. CT head negative.  IVs/PRNs:  Loaded with Keppra and ativan during seizure in ED, D5NS @ 75 ml/hr  Problem List: New onset seizure, now postictal - seizure precautions in place, keppra PO once able to take PO's Recurrent falls - bruising to face from previous falls, unsure if seizure is leading to falls or vice versa  D/C plan:  Return to [facility] memory care once she is able to [place]: Appear clear. Amy Randall understands the trajectory of her mothers illness but would like to treat the treatable. Remains DNR. IDT: updated Family: present in room  Transfer summary and med list on shadow chart.  Venia Carbon RN, BSN, [facility] Liaison

## 2020-03-20 NOTE — ED Notes (Signed)
Pt's daughter placed pt's belongings into a bag, stating that she will take pt belongings home for the night. Daughter left [facility] ED as this RN began transporting pt to assigned room, and daughter carried pt belongings with her, out of the facility.

## 2020-03-20 NOTE — Progress Notes (Signed)
Daughter asking for pain medication for patient.  MD notified and order obtained for Tylenol supp.  Also concerned re:  Of inability of patient to take po meds (particularly her cardiac medications).  This was also brought to attention of MD, who responded and wrote appropriate orders.

## 2020-03-20 NOTE — Progress Notes (Signed)
Daughter in to visit with patient.  Pt obtunded and unresponsive to painful stimuli.  Unable to take po fluids / food / medications.  Daughter states she wants patient on IV fluids.  MD informed of conversation.  It was explained to daughter to potential risks of IV fluid infusion given presence of cardiomegaly, pleural effusion, and A-fib.

## 2020-03-21 LAB — COMPREHENSIVE METABOLIC PANEL
ALT: 12 U/L (ref 0–44)
ALT: 15 U/L (ref 0–44)
AST: 15 U/L (ref 15–41)
AST: 20 U/L (ref 15–41)
Albumin: 2.6 g/dL — ABNORMAL LOW (ref 3.5–5.0)
Albumin: 3 g/dL — ABNORMAL LOW (ref 3.5–5.0)
Alkaline Phosphatase: 120 U/L (ref 38–126)
Alkaline Phosphatase: 140 U/L — ABNORMAL HIGH (ref 38–126)
Anion gap: 12 (ref 5–15)
Anion gap: 8 (ref 5–15)
BUN: 19 mg/dL (ref 8–23)
BUN: 20 mg/dL (ref 8–23)
CO2: 23 mmol/L (ref 22–32)
CO2: 25 mmol/L (ref 22–32)
Calcium: 8.3 mg/dL — ABNORMAL LOW (ref 8.9–10.3)
Calcium: 8.7 mg/dL — ABNORMAL LOW (ref 8.9–10.3)
Chloride: 101 mmol/L (ref 98–111)
Chloride: 104 mmol/L (ref 98–111)
Creatinine, Ser: 0.87 mg/dL (ref 0.44–1.00)
Creatinine, Ser: 0.88 mg/dL (ref 0.44–1.00)
Glucose, Bld: 142 mg/dL — ABNORMAL HIGH (ref 70–99)
Glucose, Bld: 80 mg/dL (ref 70–99)
Potassium: 3.5 mmol/L (ref 3.5–5.1)
Potassium: 4.3 mmol/L (ref 3.5–5.1)
Sodium: 136 mmol/L (ref 135–145)
Sodium: 137 mmol/L (ref 135–145)
Total Bilirubin: 0.6 mg/dL (ref 0.3–1.2)
Total Bilirubin: 0.7 mg/dL (ref 0.3–1.2)
Total Protein: 5.2 g/dL — ABNORMAL LOW (ref 6.5–8.1)
Total Protein: 5.8 g/dL — ABNORMAL LOW (ref 6.5–8.1)

## 2020-03-21 LAB — CBC
HCT: 33.3 % — ABNORMAL LOW (ref 36.0–46.0)
HCT: 35.2 % — ABNORMAL LOW (ref 36.0–46.0)
Hemoglobin: 10.6 g/dL — ABNORMAL LOW (ref 12.0–15.0)
Hemoglobin: 9.9 g/dL — ABNORMAL LOW (ref 12.0–15.0)
MCH: 26.3 pg (ref 26.0–34.0)
MCH: 26.3 pg (ref 26.0–34.0)
MCHC: 29.7 g/dL — ABNORMAL LOW (ref 30.0–36.0)
MCHC: 30.1 g/dL (ref 30.0–36.0)
MCV: 87.3 fL (ref 80.0–100.0)
MCV: 88.3 fL (ref 80.0–100.0)
Platelets: 247 10*3/uL (ref 150–400)
Platelets: 392 10*3/uL (ref 150–400)
RBC: 3.77 MIL/uL — ABNORMAL LOW (ref 3.87–5.11)
RBC: 4.03 MIL/uL (ref 3.87–5.11)
RDW: 15.5 % (ref 11.5–15.5)
RDW: 15.6 % — ABNORMAL HIGH (ref 11.5–15.5)
WBC: 11.4 10*3/uL — ABNORMAL HIGH (ref 4.0–10.5)
WBC: 7.4 10*3/uL (ref 4.0–10.5)
nRBC: 0 % (ref 0.0–0.2)
nRBC: 0 % (ref 0.0–0.2)

## 2020-03-21 LAB — ETHANOL: Alcohol, Ethyl (B): <10 mg/dL (ref <10)

## 2020-03-21 LAB — SARS CORONAVIRUS 2 BY RT PCR (HOSPITAL ORDER, PERFORMED IN ~~LOC~~ HOSPITAL LAB): SARS Coronavirus 2: NEGATIVE

## 2020-03-21 LAB — PROTIME-INR
INR: 1.2 (ref 0.8–1.2)
Prothrombin Time: 14.6 seconds (ref 11.4–15.2)

## 2020-03-21 MED ORDER — METOPROLOL TARTRATE 5 MG/5ML IV SOLN
5.0000 mg | Freq: Four times a day (QID) | INTRAVENOUS | Status: DC
  Administered 2020-03-21 – 2020-03-23 (×6): 5 mg via INTRAVENOUS
  Filled 2020-03-21 (×7): qty 5

## 2020-03-21 MED ORDER — RIVASTIGMINE 13.3 MG/24HR TD PT24: 13.3000 mg | MEDICATED_PATCH | Freq: Every day | TRANSDERMAL | Status: DC

## 2020-03-21 NOTE — Progress Notes (Signed)
[place] 2W05 [facility]) Hospitalized Hospice Patient  Amy Randall is a hospice patient with ACC, terminal diagnosis of cerebrovascular disease.  She has had multiple falls at her facility over the last few weeks, two being yesterday. After her second fall, staff report she was lethargic and possibly having a seizure. EMS was activated and she was transported to the ED for further evaluation. She is now postictal and unresponsive, admitted for new onset seizure activity. This is a related hospital admission.  Today, pt is more alert, not responsive during my visit. Her eyes flutter and per her daughter she moves her head towards sound. Baseline for pt is alert, able to walk, does not speak many words.   V/S:  98.5 oral, 163/80, HR 105, RR 20, 100 % via [place] @ 2 lpm I&O: 1589/400 Labs & diagnostics: none new today IVs/PRNs: lopressor 2.5 mg IV x 1, D5NS @ 75 ml/hr, keppra 500 mg IV BID  Problem list: - New onset seizures - seizure precautions in place, keppra IV, responds to verbal and tactile stimuli today - Recurrent falls - bruising to face from previous falls, unsure if seizure is leading to falls or vice versa  D/C plan:  Return to [facility] memory care once she is able to [place]: Appear clear. Amy Randall understands the trajectory of her mothers illness but would like to treat the treatable. Remains DNR. IDT: updated Family: present in room    Wallis Bamberg RN, BSN, CCRN [facility] Liaison

## 2020-03-21 NOTE — Progress Notes (Signed)
Amy Randall  BPZ:[id number] DOB: December 02, 1933 DOA: 03/19/2020 PCP: [name], MD    Brief Narrative:  86 year old SNF resident with a history of dementia and HTN who presented after 2 falls in her facility followed by unresponsiveness. She was felt to have had an observed seizure. Upon arrival via EMS she was observed to have a seizure in the ED. She was dosed with Ativan and Keppra. She is presently on hospice care and a DNR  Significant Events:  8/13 admit via [place]  Subjective: Resting quietly when I enter the room but does respond to my voice.  Does not open eyes.  Answers some very simple questions however.  Definitely more alert than yesterday.  No obvious distress.  Denies specific complaints.  Antimicrobials:  None  DVT prophylaxis: SCDs  Assessment & Plan:  Seizure - postictal encephalopathy CT head without acute findings apart from a left scalp hematoma -no prior history of seizure activity -difficult to tell if the patient's falls were related to seizures or if repetitive trauma to the left frontal lobe helped precipitate the seizures -continue low-dose Keppra for now -monitor postictal state -supportive care to continue -hold Exelon due to potential to cause sedation and lower seizure threshold  Dementia Evidence of such confirmed on CT head - at baseline she can speak but does not know who her daughter is per records  Recurrent falls Will need supportive environment and 24hr supervision at d/c   HTN Strict BP control not indicated at this time - follow BP trend   Normocytic anemia Likely due to poor nutritional state - follow trend  Code Status: NO CODE BLUE Family Communication:  Status is: Inpatient  Remains inpatient appropriate because:Inpatient level of care appropriate due to severity of illness   Dispo: The patient is from: SNF              Anticipated d/c is to: SNF              Anticipated d/c date is: 2 days              Patient  currently is not medically stable to d/c.  Consultants:  none  Objective: Blood pressure (!) 163/80, pulse (!) 105, temperature 98.5 F (36.9 C), temperature source Oral, resp. rate 16, height 5' (1.524 m), weight 54.4 kg, SpO2 100 %.  Intake/Output Summary (Last 24 hours) at 03/21/2020 0854 Last data filed at 03/21/2020 0516 Gross per 24 hour  Intake 1459.97 ml  Output 400 ml  Net 1059.97 ml   Filed Weights   03/19/20 2059  Weight: 54.4 kg    Examination: General: No acute respiratory distress - no change in hematoma of L forehead Lungs: CTA B Cardiovascular: RRR w/o M Abdomen: NT/ND, soft, BS+, no rebound Extremities: No C/C/E B LE  Neuro: spontaneous movement of all 4 ext - no posturing - more alert   CBC: Recent Labs  Lab 03/19/20 1927 03/20/20 0029  WBC 11.4* 7.4  HGB 10.6* 9.9*  HCT 35.2* 33.3*  MCV 87.3 88.3  PLT 392 247   Basic Metabolic Panel: Recent Labs  Lab 03/19/20 1927 03/20/20 0029  NA 136 137  K 4.3 3.5  CL 101 104  CO2 23 25  GLUCOSE 142* 80  BUN 20 19  CREATININE 0.87 0.88  CALCIUM 8.7* 8.3*   GFR: Estimated Creatinine Clearance (by C-G formula based on SCr of 0.88 mg/dL) Female: 24.2 mL/min Female: 44.2 mL/min  Liver Function Tests: Recent Labs  Lab 03/19/20 1927 03/20/20 0029  AST 20 15  ALT 15 12  ALKPHOS 140* 120  BILITOT 0.7 0.6  PROT 5.8* 5.2*  ALBUMIN 3.0* 2.6*    Coagulation Profile: Recent Labs  Lab 03/19/20 1927  INR 1.2    Recent Results (from the past 240 hour(s))  SARS Coronavirus 2 by RT PCR (hospital order, performed in [facility] hospital lab) Nasopharyngeal Nasopharyngeal Swab     Status: None   Collection Time: 03/19/20  7:20 PM   Specimen: Nasopharyngeal Swab  Result Value Ref Range Status   SARS Coronavirus 2 NEGATIVE NEGATIVE Final    Comment: QA FLAGS AND/OR RANGES MODIFIED BY DEMOGRAPHIC UPDATE ON 08/14 AT 0014 QA FLAGS AND/OR RANGES MODIFIED BY DEMOGRAPHIC UPDATE ON 08/14 AT  0338 (NOTE) SARS-CoV-2 target nucleic acids are NOT DETECTED.  The SARS-CoV-2 RNA is generally detectable in upper and lower respiratory specimens during the acute phase of infection. The lowest concentration of SARS-CoV-2 viral copies this assay can detect is 250 copies / mL. A negative result does not preclude SARS-CoV-2 infection and should not be used as the sole basis for treatment or other patient management decisions.  A negative result may occur with improper specimen collection / handling, submission of specimen other than nasopharyngeal swab, presence of viral mutation(s) within the areas targeted by this assay, and inadequate number of viral copies (<250 copies / mL). A negative result must be combined with clinical observations, patient history, and epidemiological information.  Fact Sheet for Patients:   https://www.fda. gov/media/136312/download  Fact Sheet for Healthcare Providers: https://pope.com/  This test is not yet approved or cleared by the [place] FDA and has been authorized for detection and/or diagnosis of SARS-CoV-2 by FDA under an Emergency Use Authorization (EUA).  This EUA will remain in effect (meaning this test can be used) for the duration of the COVID-19 declaration under Section 564(b)(1) of the Act, 21 U.S.C. section 360bbb-3(b)(1), unless the authorization is terminated or revoked sooner.  Performed at [facility], [address] [place], [place] [address]      Scheduled Meds: . metoprolol tartrate  2.5 mg Intravenous Q6H   Continuous Infusions: . dextrose 5 % and 0.9 % NaCl with KCl 20 mEq/L 75 mL/hr at 03/20/20 2319  . levETIRAcetam 500 mg (03/21/20 0405)     LOS: 2 days   Lonia Blood, MD [facility] Office  [phone] Pager - Text Page per Amion  If 7PM-7AM, please contact night-coverage per Amion 03/21/2020, 8:54 AM

## 2020-03-22 LAB — COMPREHENSIVE METABOLIC PANEL
ALT: 261 U/L — ABNORMAL HIGH (ref 0–44)
AST: 450 U/L — ABNORMAL HIGH (ref 15–41)
Albumin: 2.8 g/dL — ABNORMAL LOW (ref 3.5–5.0)
Alkaline Phosphatase: 211 U/L — ABNORMAL HIGH (ref 38–126)
Anion gap: 12 (ref 5–15)
BUN: 22 mg/dL (ref 8–23)
CO2: 18 mmol/L — ABNORMAL LOW (ref 22–32)
Calcium: 8.7 mg/dL — ABNORMAL LOW (ref 8.9–10.3)
Chloride: 113 mmol/L — ABNORMAL HIGH (ref 98–111)
Creatinine, Ser: 1.02 mg/dL — ABNORMAL HIGH (ref 0.44–1.00)
GFR calc Af Amer: 58 mL/min — ABNORMAL LOW (ref >60)
GFR calc non Af Amer: 50 mL/min — ABNORMAL LOW (ref >60)
Glucose, Bld: 137 mg/dL — ABNORMAL HIGH (ref 70–99)
Potassium: 6 mmol/L — ABNORMAL HIGH (ref 3.5–5.1)
Sodium: 143 mmol/L (ref 135–145)
Total Bilirubin: 2.7 mg/dL — ABNORMAL HIGH (ref 0.3–1.2)
Total Protein: 5.7 g/dL — ABNORMAL LOW (ref 6.5–8.1)

## 2020-03-22 LAB — CBC
HCT: 39.9 % (ref 36.0–46.0)
Hemoglobin: 11.5 g/dL — ABNORMAL LOW (ref 12.0–15.0)
MCH: 25.7 pg — ABNORMAL LOW (ref 26.0–34.0)
MCHC: 28.8 g/dL — ABNORMAL LOW (ref 30.0–36.0)
MCV: 89.3 fL (ref 80.0–100.0)
Platelets: 324 10*3/uL (ref 150–400)
RBC: 4.47 MIL/uL (ref 3.87–5.11)
RDW: 15.8 % — ABNORMAL HIGH (ref 11.5–15.5)
WBC: 9 10*3/uL (ref 4.0–10.5)
nRBC: 0.2 % (ref 0.0–0.2)

## 2020-03-22 MED ORDER — SODIUM CHLORIDE 0.9 % IV SOLN: 250.0000 mg | Freq: Two times a day (BID) | INTRAVENOUS | Status: DC

## 2020-03-22 MED ORDER — DEXTROSE-NACL 5-0.45 % IV SOLN: INTRAVENOUS | Status: DC

## 2020-03-22 NOTE — Evaluation (Signed)
Physical Therapy Evaluation Patient Details Name: Amy Randall MRN: [id number] DOB: 1933/09/20 Today's Date: 03/22/2020   History of Present Illness  86 year old SNF resident with a history of dementia and HTN who presented after 2 falls in her facility followed by unresponsiveness. She was felt to have had an observed seizure. Upon arrival via EMS she was observed to have a seizure in the ED. She was dosed with Ativan and Keppra. She is presently on hospice care and a   Clinical Impression  Pt admitted with/for unresponsiveness suspected in part due to seizure.  Pt is still relatively unresponsive with minimal reaction to sitting up EOB, needing total assist overall.  Pt currently limited functionally due to the problems listed. ( See problems list.)   Pt will benefit from PT to maximize function and safety in order to get ready for next venue listed below.     Follow Up Recommendations SNF;Other (comment) (to no follow up if improves once alert)    Equipment Recommendations  None recommended by PT    Recommendations for Other Services       Precautions / Restrictions Precautions Precautions: Fall      Mobility  Bed Mobility Overal bed mobility: Needs Assistance Bed Mobility: Supine to Sit;Sit to Supine     Supine to sit: Total assist;+2 for physical assistance Sit to supine: Total assist;+2 for physical assistance   General bed mobility comments: pt made no attempts to assist  Transfers Overall transfer level: Needs assistance               General transfer comment: not attempted today  Ambulation/Gait             General Gait Details: NT  Stairs            Wheelchair Mobility    Modified Rankin (Stroke Patients Only)       Balance Overall balance assessment: Needs assistance Sitting-balance support: Feet supported;No upper extremity supported Sitting balance-Leahy Scale: Zero Sitting balance - Comments: pt pushed into extension when  leaned too far forward and righted to correct for lean to the right, but generally needed maximal assist                                     Pertinent Vitals/Pain Pain Assessment: Faces Faces Pain Scale: Hurts a little bit Pain Descriptors / Indicators: Moaning Pain Intervention(s): Monitored during session    Home Living Family/patient expects to be discharged to:: Skilled nursing facility                 Additional Comments: memory unit    Prior Function           Comments: No information on PLOF     Hand Dominance        Extremity/Trunk Assessment   Upper Extremity Assessment Upper Extremity Assessment: Defer to OT evaluation    Lower Extremity Assessment Lower Extremity Assessment: Difficult to assess due to impaired cognition (Tonal increases/contractions to leaning forward.)    Cervical / Trunk Assessment Cervical / Trunk Assessment:  (truncal reaction to moving pt outside her BOS)  Communication   Communication: Other (comment) (short words of affirmation only)  Cognition Arousal/Alertness: Lethargic Behavior During Therapy:  (not alert) Overall Cognitive Status: History of cognitive impairments - at baseline  General Comments General comments (skin integrity, edema, etc.): Once moved to a sitting position, pt began to moan with each exhale.   Pt verbalized her desire to lay back down with a clear "Uh Huh" to the question.    Exercises Other Exercises Other Exercises: Warm up LE ROM exercise without any noticeable reaction from the patient   Assessment/Plan    PT Assessment Patient needs continued PT services  PT Problem List Decreased activity tolerance;Decreased mobility;Decreased cognition;Decreased safety awareness       PT Treatment Interventions Gait training;Functional mobility training;Therapeutic activities;Balance training;Patient/family education    PT Goals  (Current goals can be found in the Care Plan section)  Acute Rehab PT Goals Patient Stated Goal: pt unable to participate with goals PT Goal Formulation: Patient unable to participate in goal setting Time For Goal Achievement: 04/05/20 Potential to Achieve Goals: Fair    Frequency Min 2X/week   Barriers to discharge        Co-evaluation PT/OT/SLP Co-Evaluation/Treatment: Yes Reason for Co-Treatment: To address functional/ADL transfers PT goals addressed during session: Mobility/safety with mobility         AM-PAC PT "6 Clicks" Mobility  Outcome Measure Help needed turning from your back to your side while in a flat bed without using bedrails?: Total Help needed moving from lying on your back to sitting on the side of a flat bed without using bedrails?: Total Help needed moving to and from a bed to a chair (including a wheelchair)?: Total Help needed standing up from a chair using your arms (e.g., wheelchair or bedside chair)?: Total Help needed to walk in hospital room?: Total Help needed climbing 3-5 steps with a railing? : Total 6 Click Score: 6    End of Session   Activity Tolerance: Patient tolerated treatment well Patient left: in bed;with call bell/phone within reach;with bed alarm set Nurse Communication: Mobility status PT Visit Diagnosis: Other symptoms and signs involving the nervous system (R29.898);Other abnormalities of gait and mobility (R26.89)    Time: 0093-8182 PT Time Calculation (min) (ACUTE ONLY): 19 min   Charges:   PT Evaluation $PT Eval Moderate Complexity: 1 Mod          03/22/2020  Jacinto Halim., PT Acute Rehabilitation Services [phone]  (pager) [phone]  (office)  [name] 03/22/2020, 1:15 PM

## 2020-03-22 NOTE — Progress Notes (Signed)
[place] 2W05 [facility]) Hospitalized Hospice Patient  Ms. Seith is a hospice patient with ACC, terminal diagnosis of cerebrovascular disease.  She has had multiple falls at her facility over the last few weeks, two being yesterday. After her second fall, staff report she was lethargic and possibly having a seizure. EMS was activated and she was transported to the ED for further evaluation. She is now postictal and unresponsive, admitted for new onset seizure activity. This is a related hospital admission.  Met with pt, she did not arouse during my visit.  Spoke with daughter Langley Gauss.  She is very active in the care of her mother. At baseline, pt walks some, uses wheelchair and speaks a few words.  Per Langley Gauss, pt is near her baseline.  Langley Gauss would like for her to return to [facility].   V/S:  Afebrile, 114/60, HR 80, RR 20, SPO2 94% on RA I&O:  1545/1195 Labs: K 6.0, AST 450, ALT 261 IVs/PRNs: D5NS @ 75 m/hr  Problem List: -New onset seizure, now postictal - seizure precautions in place, pt alert to her baseline per her daughter -Recurrent falls - bruising to face from previous falls, unsure if seizure is leading to falls or vice versa  D/C plan:  Return to [facility] memory care once she is able to [place]: Appear clear. Langley Gauss understands the trajectory of her mothers illness but would like to treat the treatable. Remains DNR. Daughter wants to advance her diet.  Discussed comfort feeds versus SLP intervention.  Dtr will ask RN to see if diet can be advanced IDT: updated Family: updated  Venia Carbon RN, BSN, [facility] Liaison

## 2020-03-22 NOTE — TOC Initial Note (Signed)
Transition of Care [facility]) - Initial/Assessment Note    Patient Details  Name: Amy Randall MRN: [id number] Date of Birth: 1934-07-27  Transition of [facility]) CM/SW Contact:    Lorri Frederick, LCSW Phone Number: 03/22/2020, 3:48 PM  Clinical Narrative:   CSW attempted to speak with pt, who did not open her eyes and was only minimally responsive.  CSW spoke with daughter, who reports she would like pt returned to [facility] after discharge to continue with Hospice.  CSW spoke with French Ana at [facility] regarding potential discharge tomorrow and this would be fine with them.          Expected Discharge Plan: Assisted Living [facility] memory) Barriers to Discharge: Continued Medical Work up   Patient Goals and CMS Choice Patient states their goals for this hospitalization and ongoing recovery are:: per daughter, return to [place] with Hospice services      Expected Discharge Plan and Services Expected Discharge Plan: Assisted Living [facility] memory)     Post Acute Care Choice: Hospice, Resumption of Svcs/PTA Provider Living arrangements for the past 2 months: Assisted Living Facility                                      Prior Living Arrangements/Services Living arrangements for the past 2 months: Assisted Living Facility Lives with:: Facility Resident Patient language and need for interpreter reviewed:: Yes Do you feel safe going back to the place where you live?: Yes      Need for Family Participation in Patient Care: Yes (Comment) Care giver support system in place?: Yes (comment) Current home services: Hospice Criminal Activity/Legal Involvement Pertinent to Current Situation/Hospitalization: No - Comment as needed  Activities of Daily Living      Permission Sought/Granted                  Emotional Assessment Appearance:: Appears stated age Attitude/Demeanor/Rapport: Unable to Assess Affect (typically observed): Unable to  Assess Orientation: :  (Not oriented) Alcohol / Substance Use: Not Applicable Psych Involvement: No (comment)  Admission diagnosis:  Seizure (HCC) [R56.9] AMS (altered mental status) [R41.82] Patient Active Problem List   Diagnosis Date Noted  . AMS (altered mental status) 03/19/2020  . Seizure (HCC) 03/19/2020  . Fall at home, initial encounter 03/19/2020   PCP:  [name], MD Pharmacy:  No Pharmacies Listed    Social Determinants of Health (SDOH) Interventions    Readmission Risk Interventions No flowsheet data found.

## 2020-03-22 NOTE — Progress Notes (Signed)
Amy Randall  KAJ:[id number] DOB: 09-19-1933 DOA: 03/19/2020 PCP: [name], MD    Brief Narrative:  86 year old SNF resident with a history of dementia and HTN who presented after 2 falls in her facility followed by unresponsiveness. She was felt to have had an observed seizure. Upon arrival via EMS she was observed to have a seizure in the ED. She was dosed with Ativan and Keppra. She is presently on hospice care and a DNR  Significant Events:  8/13 admit via [place]  Subjective: Mental status has not changed appreciably since exam yesterday.  No family present at time of exam.  No evidence of respiratory distress or discomfort.  Patient does move her arms and legs spontaneously intermittently.  Antimicrobials:  None  DVT prophylaxis: SCDs  Assessment & Plan:  Seizure - postictal encephalopathy CT head without acute findings apart from a left scalp hematoma -no prior history of seizure activity -difficult to tell if the patient's falls were related to seizures or if repetitive trauma to the left frontal lobe helped precipitate the seizures -continue low-dose Keppra for now - monitor postictal state - supportive care to continue -hold Exelon due to potential to cause sedation and lower seizure threshold -mental status slow to improve -if no significant improvement by tomorrow we will consider repeat CT head  Dementia Evidence of such confirmed on CT head - at baseline she can speak but does not know who her daughter is per records  Transaminitis Etiology unclear -?shock liver - monitor trend - acute hepatitis panel w/ next blood draw  Hyperkalemia Modest -monitor for now -if increases further will require treatment -discontinue potassium in IV fluid  Recurrent falls Will need supportive environment and 24hr supervision at d/c   HTN Strict BP control not indicated at this time - follow BP trend   Normocytic anemia Likely due to poor nutritional state - follow  trend  Code Status: NO CODE BLUE Family Communication:  Status is: Inpatient  Remains inpatient appropriate because:Inpatient level of care appropriate due to severity of illness   Dispo: The patient is from: SNF              Anticipated d/c is to: SNF              Anticipated d/c date is: 2 days              Patient currently is not medically stable to d/c.  Consultants:  none  Objective: Blood pressure (!) 119/92, pulse (!) 111, temperature 98.2 F (36.8 C), temperature source Axillary, resp. rate 18, height 5' (1.524 m), weight 54.4 kg, SpO2 94 %.  Intake/Output Summary (Last 24 hours) at 03/22/2020 0939 Last data filed at 03/22/2020 0351 Gross per 24 hour  Intake 1545.15 ml  Output 350 ml  Net 1195.15 ml   Filed Weights   03/19/20 2059  Weight: 54.4 kg    Examination: General: No acute respiratory distress Lungs: CTA B Cardiovascular: RRR w/o M Abdomen: ND, soft, BS+, no rebound Extremities: No C/C/E B LE  Neuro: spontaneous movement of all 4 ext - no posturing -no significant change in level of consciousness compared to yesterday  CBC: Recent Labs  Lab 03/19/20 1927 03/20/20 0029 03/22/20 0206  WBC 11.4* 7.4 9.0  HGB 10.6* 9.9* 11.5*  HCT 35.2* 33.3* 39.9  MCV 87.3 88.3 89.3  PLT 392 247 324   Basic Metabolic Panel: Recent Labs  Lab 03/19/20 1927 03/20/20 0029 03/22/20 0206  NA 136 137  143  K 4.3 3.5 6.0*  CL 101 104 113*  CO2 23 25 18*  GLUCOSE 142* 80 137*  BUN 20 19 22   CREATININE 0.87 0.88 1.02*  CALCIUM 8.7* 8.3* 8.7*   GFR: Estimated Creatinine Clearance: 29.5 mL/min (A) (by C-G formula based on SCr of 1.02 mg/dL (H)).  Liver Function Tests: Recent Labs  Lab 03/19/20 1927 03/20/20 0029 03/22/20 0206  AST 20 15 450*  ALT 15 12 261*  ALKPHOS 140* 120 211*  BILITOT 0.7 0.6 2.7*  PROT 5.8* 5.2* 5.7*  ALBUMIN 3.0* 2.6* 2.8*    Coagulation Profile: Recent Labs  Lab 03/19/20 1927  INR 1.2    Recent Results (from the past  240 hour(s))  SARS Coronavirus 2 by RT PCR (hospital order, performed in [facility] hospital lab) Nasopharyngeal Nasopharyngeal Swab     Status: None   Collection Time: 03/19/20  7:20 PM   Specimen: Nasopharyngeal Swab  Result Value Ref Range Status   SARS Coronavirus 2 NEGATIVE NEGATIVE Final    Comment: QA FLAGS AND/OR RANGES MODIFIED BY DEMOGRAPHIC UPDATE ON 08/14 AT 0014 QA FLAGS AND/OR RANGES MODIFIED BY DEMOGRAPHIC UPDATE ON 08/14 AT 9/14 QA FLAGS AND/OR RANGES MODIFIED BY DEMOGRAPHIC UPDATE ON 08/15 AT 1837 QA FLAGS AND/OR RANGES MODIFIED BY DEMOGRAPHIC UPDATE ON 08/15 AT 1956 (NOTE) SARS-CoV-2 target nucleic acids are NOT DETECTED.  The SARS-CoV-2 RNA is generally detectable in upper and lower respiratory specimens during the acute phase of infection. The lowest concentration of SARS-CoV-2 viral copies this assay can detect is 250 copies / mL. A negative result does not preclude SARS-CoV-2 infection and should not be used as the sole basis for treatment or other patient management decisions.  A negative result may occur with improper specimen collection / handling, submission of specimen other than nasopharyngeal swab, presence of viral mutation(s) within the areas targeted by this assay, and inadequate number of viral copies (<250 copies / mL). A negative result  must be combined with clinical observations, patient history, and epidemiological information.  Fact Sheet for Patients:   9/15  Fact Sheet for Healthcare Providers: BoilerBrush.com.cy  This test is not yet approved or cleared by the https://pope.com/ FDA and has been authorized for detection and/or diagnosis of SARS-CoV-2 by FDA under an Emergency Use Authorization (EUA).  This EUA will remain in effect (meaning this test can be used) for the duration of the COVID-19 declaration under Section 564(b)(1) of the Act, 21 U.S.C. section 360bbb-3(b)(1), unless  the authorization is terminated or revoked sooner.  Performed at [facility], [address], [place], [place]      Scheduled Meds:  metoprolol tartrate  5 mg Intravenous Q6H   Continuous Infusions:  dextrose 5 % and 0.9 % NaCl with KCl 20 mEq/L 75 mL/hr at 03/22/20 0403   levETIRAcetam 500 mg (03/22/20 0407)     LOS: 3 days   03/24/20, MD [facility] Office  [phone] Pager - Text Page per [phone]  If 7PM-7AM, please contact night-coverage per Amion 03/22/2020, 9:39 AM

## 2020-03-22 NOTE — Evaluation (Signed)
Occupational Therapy Evaluation Patient Details Name: Amy Randall MRN: [id number] DOB: Dec 26, 1933 Today's Date: 03/22/2020    History of Present Illness 86 year old SNF resident with a history of dementia and HTN who presented after 2 falls in her facility followed by unresponsiveness. She was felt to have had an observed seizure. Upon arrival via EMS she was observed to have a seizure in the ED. She was dosed with Ativan and Keppra. She is presently on hospice care and a    Clinical Impression   Pt from [facility] memory care unit and has been experiencing several falls recently. Pt presents now s/p seizure and grossly non-responsive. Pt Total A x 2 for bed mobility, Total A for simple ADLs, and to maintain sitting balance EOB. Pt with some righting reactions noted and "uh huh" responses to 2 questions. Pt would benefit from skilled OT services to maximize ability to follow one step commands and participate in simple ADLs.     Follow Up Recommendations  SNF;Supervision/Assistance - 24 hour    Equipment Recommendations  None recommended by OT    Recommendations for Other Services       Precautions / Restrictions Precautions Precautions: Fall Restrictions Weight Bearing Restrictions: No      Mobility Bed Mobility Overal bed mobility: Needs Assistance Bed Mobility: Supine to Sit;Sit to Supine     Supine to sit: Total assist;+2 for physical assistance Sit to supine: Total assist;+2 for physical assistance   General bed mobility comments: pt made no attempts to assist  Transfers Overall transfer level: Needs assistance               General transfer comment: not attempted today    Balance Overall balance assessment: Needs assistance Sitting-balance support: Feet supported;No upper extremity supported Sitting balance-Leahy Scale: Zero Sitting balance - Comments: pt pushed into extension when leaned too far forward and righted to correct for lean to the  right, but generally needed maximal assist                                   ADL either performed or assessed with clinical judgement   ADL Overall ADL's : Needs assistance/impaired Eating/Feeding: Total assistance   Grooming: Total assistance Grooming Details (indicate cue type and reason): Total A to wash face, attempted washcloth to awaken pt Upper Body Bathing: Total assistance   Lower Body Bathing: Total assistance   Upper Body Dressing : Total assistance   Lower Body Dressing: Total assistance Lower Body Dressing Details (indicate cue type and reason): Total A to don socks bed level     Toileting- Clothing Manipulation and Hygiene: Total assistance         General ADL Comments: Pt Total A for ADLs, postictal state     Vision Baseline Vision/History:  (unsure, will further assess) Vision Assessment?:  (will further assess)     Perception     Praxis      Pertinent Vitals/Pain Pain Assessment: Faces Faces Pain Scale: Hurts a little bit Pain Descriptors / Indicators: Moaning Pain Intervention(s): Monitored during session     Hand Dominance Right   Extremity/Trunk Assessment Upper Extremity Assessment Upper Extremity Assessment: Generalized weakness;RUE deficits/detail;Difficult to assess due to impaired cognition RUE Deficits / Details: Some R shoulder flexion stiffness noted with PROM   Lower Extremity Assessment Lower Extremity Assessment: Defer to PT evaluation   Cervical / Trunk Assessment Cervical / Trunk Assessment:  (truncal  reaction to moving pt outside her BOS)   Communication Communication Communication: Other (comment) (short words of affirmation only)   Cognition Arousal/Alertness: Lethargic Behavior During Therapy:  (not alert) Overall Cognitive Status: History of cognitive impairments - at baseline                                     General Comments  When transitioning to sitting EOB, pt with moaning. When  asked if her eyes were blue and if she wanted to lay back down, pt clearly answered "uh huh"    Exercises Exercises: Other exercises Other Exercises Other Exercises: PROM of B UE with no noted reaction    Shoulder Instructions      Home Living Family/patient expects to be discharged to:: Skilled nursing facility                                 Additional Comments: memory unit at [facility]      Prior Functioning/Environment          Comments: No information on PLOF        OT Problem List: Decreased strength;Decreased activity tolerance;Impaired balance (sitting and/or standing);Decreased coordination;Decreased cognition;Decreased safety awareness;Cardiopulmonary status limiting activity      OT Treatment/Interventions: Self-care/ADL training;Therapeutic exercise;Energy conservation;DME and/or AE instruction;Therapeutic activities;Patient/family education    OT Goals(Current goals can be found in the care plan section) Acute Rehab OT Goals Patient Stated Goal: pt unable to participate with goals OT Goal Formulation: Patient unable to participate in goal setting Time For Goal Achievement: 04/05/20 Potential to Achieve Goals: Fair ADL Goals Pt Will Perform Grooming: with min assist;bed level Pt Will Perform Upper Body Bathing: with min assist;sitting Additional ADL Goal #1: Pt to demonstrate ability to sit EOB during functional activities with no more than Min A to maintain balance for 1-3 minutes  OT Frequency: Min 2X/week   Barriers to D/C:            Co-evaluation PT/OT/SLP Co-Evaluation/Treatment: Yes Reason for Co-Treatment: Complexity of the patient's impairments (multi-system involvement);Necessary to address cognition/behavior during functional activity;For patient/therapist safety;To address functional/ADL transfers PT goals addressed during session: Mobility/safety with mobility OT goals addressed during session: ADL's and self-care       AM-PAC OT "6 Clicks" Daily Activity     Outcome Measure Help from another person eating meals?: Total Help from another person taking care of personal grooming?: Total Help from another person toileting, which includes using toliet, bedpan, or urinal?: Total Help from another person bathing (including washing, rinsing, drying)?: Total Help from another person to put on and taking off regular upper body clothing?: Total Help from another person to put on and taking off regular lower body clothing?: Total 6 Click Score: 6   End of Session Equipment Utilized During Treatment: Oxygen Nurse Communication: Mobility status  Activity Tolerance: Patient limited by fatigue;Patient limited by lethargy Patient left: in bed;with call bell/phone within reach;with bed alarm set  OT Visit Diagnosis: Unsteadiness on feet (R26.81);Other abnormalities of gait and mobility (R26.89);Muscle weakness (generalized) (M62.81);Other symptoms and signs involving cognitive function                Time: 1607-3710 OT Time Calculation (min): 19 min Charges:  OT General Charges $OT Visit: 1 Visit OT Evaluation $OT Eval Moderate Complexity: 1 Mod  Lorre Munroe, OTR/L  [place]  Braley Luckenbaugh 03/22/2020, 1:44 PM

## 2020-03-23 ENCOUNTER — Other Ambulatory Visit (HOSPITAL_COMMUNITY): Payer: Medicare Other

## 2020-03-23 ENCOUNTER — Inpatient Hospital Stay (HOSPITAL_COMMUNITY)

## 2020-03-23 DIAGNOSIS — R410 Disorientation, unspecified: Secondary | ICD-10-CM

## 2020-03-23 DIAGNOSIS — R7989 Other specified abnormal findings of blood chemistry: Secondary | ICD-10-CM

## 2020-03-23 DIAGNOSIS — Z515 Encounter for palliative care: Secondary | ICD-10-CM

## 2020-03-23 LAB — COMPREHENSIVE METABOLIC PANEL
ALT: 1218 U/L — ABNORMAL HIGH (ref 0–44)
AST: 2060 U/L — ABNORMAL HIGH (ref 15–41)
Albumin: 2.9 g/dL — ABNORMAL LOW (ref 3.5–5.0)
Alkaline Phosphatase: 209 U/L — ABNORMAL HIGH (ref 38–126)
Anion gap: 10 (ref 5–15)
BUN: 32 mg/dL — ABNORMAL HIGH (ref 8–23)
CO2: 19 mmol/L — ABNORMAL LOW (ref 22–32)
Calcium: 8.8 mg/dL — ABNORMAL LOW (ref 8.9–10.3)
Chloride: 114 mmol/L — ABNORMAL HIGH (ref 98–111)
Creatinine, Ser: 1.17 mg/dL — ABNORMAL HIGH (ref 0.44–1.00)
GFR calc Af Amer: 50 mL/min — ABNORMAL LOW (ref >60)
GFR calc non Af Amer: 43 mL/min — ABNORMAL LOW (ref >60)
Glucose, Bld: 127 mg/dL — ABNORMAL HIGH (ref 70–99)
Potassium: 5.4 mmol/L — ABNORMAL HIGH (ref 3.5–5.1)
Sodium: 143 mmol/L (ref 135–145)
Total Bilirubin: 2 mg/dL — ABNORMAL HIGH (ref 0.3–1.2)
Total Protein: 5.8 g/dL — ABNORMAL LOW (ref 6.5–8.1)

## 2020-03-23 LAB — HEPATITIS PANEL, ACUTE
HCV Ab: NONREACTIVE
Hep A IgM: NONREACTIVE
Hep B C IgM: NONREACTIVE
Hepatitis B Surface Ag: NONREACTIVE

## 2020-03-23 LAB — AMMONIA: Ammonia: 35 umol/L (ref 9–35)

## 2020-03-23 NOTE — Progress Notes (Signed)
[place] 2W05 [facility]) Hospitalized Hospice Patient  Ms. Talamante is a hospice patient with [facility], terminal diagnosis of cerebrovascular disease. She has had multiple falls at her facility over the last few weeks, two being yesterday. After her second fall, staff report she was lethargic and possibly having a seizure. EMS was activated and she was transported to the ED for further evaluation. She is now postictal and unresponsive, admitted for new onset seizure activity. This is a related hospital admission.  Met with pt and daughter at bedside, patient was resting during my visit and had little to no movement. Daughter was very tearful about her mothers decline but realizes the poor prognosis as she is also a Therapist, sports.  She is very active in the care of her mother. At baseline, pt walks some, uses wheelchair and speaks a few words.  Per Langley Gauss, pt is near her baseline.  Langley Gauss would like for her to return to [facility] when possible to get her back into her known atmosphere.   V/S:  Afebrile, 159/125, HR 119, RR 20, SPO2 98% on RA I&O:  381.2/50 Labs:  Potassium: 5.4 (H) Chloride: 114 (H) CO2: 19 (L) Glucose: 127 (H) BUN: 32 (H) Creatinine: 1.17 (H) Calcium: 8.8 (L) Alkaline Phosphatase: 209 (H) Albumin: 2.9 (L) AST: 2,060 (H) ALT: 1,218 (H) Total Protein: 5.8 (L) Total Bilirubin: 2.0 (H) GFR, Est Non African American: 43 (L) GFR, Est African American: 50 (L)  IVs/PRNs: D5NS @ 175m/hr  Problem List: -New onset seizure, now postictal - seizure precautions in place, pt alert to her baseline per her daughter -Recurrent falls - bruising to face from previous falls, unsure if seizure is leading to falls or vice versa  [facility] GOC: Appear clear. DLangley Gaussunderstands the trajectory of her mothers illness but would like to treat the treatable. Remains DNR. Daughter wants to advance her diet. Discussed comfort  feeds versus SLP intervention.  MD advanced diet.  IDT: updated Family: updated  MClementeen Hoof RN, [facility] [phone]

## 2020-03-23 NOTE — Progress Notes (Signed)
PIV consult: Arrived to room, R forearm site patent. Placed this morning. RN made aware.

## 2020-03-23 NOTE — Plan of Care (Signed)
  Problem: Skin Integrity: Goal: Risk for impaired skin integrity will decrease Outcome: Progressing   

## 2020-03-23 NOTE — [place] FL2 (Signed)
  [place] MEDICAID FL2 LEVEL OF CARE SCREENING TOOL     IDENTIFICATION  Patient Name: Amy Randall Birthdate: 1934/07/03 Sex: female Admission Date (Current Location): 03/19/2020  [facility] and [place] Number:  Producer, television/film/video and Address:  [facility] [place], [address] [place], [place] [address]      Provider Number: [id number]  Attending Physician Name and Address:  Lonia Blood, MD  Relative Name and Phone Number:  [name], [phone]    Current Level of Care: Hospital Recommended Level of Care: Memory Care Prior Approval Number:    Date Approved/Denied:   PASRR Number:    Discharge Plan: Other (Comment)    Current Diagnoses: Patient Active Problem List   Diagnosis Date Noted  . AMS (altered mental status) 03/19/2020  . Seizure (HCC) 03/19/2020  . Fall at home, initial encounter 03/19/2020    Orientation RESPIRATION BLADDER Height & Weight      (not oriented)  O2 Incontinent Weight: 120 lb (54.4 kg) Height:  5' (152.4 cm)  BEHAVIORAL SYMPTOMS/MOOD NEUROLOGICAL BOWEL NUTRITION STATUS      Continent Diet (regular,see discharge summary)  AMBULATORY STATUS COMMUNICATION OF NEEDS Skin   Total Care Does not communicate Other (Comment) (ecchymosis)                       Personal Care Assistance Level of Assistance  Bathing, Feeding, Dressing Bathing Assistance: Maximum assistance Feeding assistance: Maximum assistance Dressing Assistance: Maximum assistance     Functional Limitations Info  Speech     Speech Info: Impaired    SPECIAL CARE FACTORS FREQUENCY   (Hospice)                    Contractures Contractures Info: Not present    Additional Factors Info  Code Status, Allergies Code Status Info: DNR Allergies Info: Aricept Donepezil, Bactrim Sulfamethoxazole-trimethoprim, Citrus, Ibuprofen, Penicillins, Sulfa Antibiotics, Cefepime           Current Medications (03/23/2020):   This is the current hospital active medication list Current Facility-Administered Medications  Medication Dose Route Frequency Provider Last Rate Last Admin  . dextrose 5 %-0.45 % sodium chloride infusion   Intravenous Continuous Lonia Blood, MD 100 mL/hr at 03/23/20 0943 Rate Change at 03/23/20 0943  . metoprolol tartrate (LOPRESSOR) injection 5 mg  5 mg Intravenous Q6H Lonia Blood, MD   5 mg at 03/23/20 1135  . ondansetron (ZOFRAN) injection 4 mg  4 mg Intravenous Q6H PRN Rometta Emery, MD   4 mg at 03/21/20 2251     Discharge Medications: Please see discharge summary for a list of discharge medications.  Relevant Imaging Results:  Relevant Lab Results:   Additional Information No SSN recorded  Lorri Frederick, LCSW

## 2020-03-23 NOTE — TOC Transition Note (Signed)
Transition of Care [facility]) - CM/SW Discharge Note   Patient Details  Name: Amy Randall MRN: [id number] Date of Birth: [birth year]  Transition of Care [facility]) CM/SW Contact:  Lorri Frederick, LCSW Phone Number: 03/23/2020, 3:39 PM   Clinical Narrative:   CSW confirmed with [facility], that they can receive pt today.  FL2 faxed to [facility].  [name], daughter, informed.  [facility] called.  RN please call report to [phone].    Final next level of care: Assisted Living Barriers to Discharge: Barriers Resolved   Patient Goals and CMS Choice Patient states their goals for this hospitalization and ongoing recovery are:: per daughter, return to [facility] with Hospice services      Discharge Placement                [facility] Name of family member notified: [name], daughter Patient and family notified of of transfer: 03/23/20  Discharge Plan and Services     Post Acute Care Choice: Hospice, Resumption of Svcs/PTA Provider                      [facility] Agency: [facility] Date [facility] Agency Contacted: 03/23/20 Time HH Agency Contacted: 1500 Representative spoke with at [facility] Agency: Melissa  Social Determinants of Health (SDOH) Interventions     Readmission Risk Interventions No flowsheet data found.

## 2020-03-23 NOTE — Discharge Instructions (Signed)
Hospice Hospice is a service that is designed to provide people who are terminally ill and their families with medical, spiritual, and psychological support. Its aim is to improve your quality of life by keeping you as comfortable as possible in the final stages of life. Who will be my providers when I begin hospice care? Hospice teams often include:  A nurse.  A doctor. The hospice doctor will be available for your care, but you can include your regular doctor or nurse practitioner.  A social worker.  A counselor.  A religious leader (such as a chaplain).  A dietitian.  Therapists.  Trained volunteers who can help with care. What services does hospice provide? Hospice services can vary depending on the center or organization. Generally, they include:  Ways to keep you comfortable, such as: ? Providing care in your home or in a home-like setting. ? Working with your family and friends to help meet your needs. ? Allowing you to enjoy the support of loved ones by receiving much of your basic care from family and friends.  Pain relief and symptom management. The staff will supply all necessary medicines and equipment so that you can stay comfortable and alert enough to enjoy the company of your friends and family.  Visits or care from a nurse and doctor. This may include 24-hour on-call services.  Companionship when you are alone.  Allowing you and your family to rest. Hospice staff may do light housekeeping, prepare meals, and run errands.  Counseling. They will make sure your emotional, spiritual, and social needs are being met, as well as those needs of your family members.  Spiritual care. This will be individualized to meet your needs and your family's needs. It may involve: ? Helping you and your family understand the dying process. ? Helping you say goodbye to your family and friends. ? Performing a specific religious ceremony or ritual.  Massage.  Nutrition  therapy.  Physical and occupational therapy.  Short-term inpatient care, if something cannot be managed in the home.  Art or music therapy.  Bereavement support for grieving family members. When should hospice care begin? Most people who use hospice are believed to have less than 6 months to live.  Your family and health care providers can help you decide when hospice services should begin.  If you live longer than 6 months but your condition does not improve, your doctor may be able to approve you for continued hospice care.  If your condition improves, you may discontinue the program. What should I consider before selecting a program? Most hospice programs are run by nonprofit, independent organizations. Some are affiliated with hospitals, nursing homes, or home health care agencies. Hospice programs can take place in your home or at a hospice center, hospital, or skilled nursing facility. When choosing a hospice program, ask the following questions:  What services are available to me?  What services will be offered to my loved ones?  How involved will my loved ones be?  How involved will my health care provider be?  Who makes up the hospice care team? How are they trained or screened?  How will my pain and symptoms be managed?  If my circumstances change, can the services be provided in a different setting, such as my home or in the hospital?  Is the program reviewed and licensed by the state or certified in some other way?  What does it cost? Is it covered by insurance?  If I choose a hospice   center or nursing home, where is the hospice center located? Is it convenient for family and friends?  If I choose a hospice center or nursing home, can my family and friends visit any time?  Will you provide emotional and spiritual support?  Who can my family call with questions? Where can I learn more about hospice? You can learn about existing hospice programs in your area  from your health care providers. You can also read more about hospice online. The websites of the following organizations have helpful information:  National Hospice and Palliative Care Organization (NHPCO): www.nhpco.org  National Association for Home Care & Hospice (NAHC): www.nahc.org  Hospice Foundation of America (HFA): www.hospicefoundation.org  American Cancer Society (ACS): www.cancer.org  Hospice Net: www.hospicenet.org  Visiting Nurse Associations of [employer] (VNAA): www.vnaa.org You may also find more information by contacting the following agencies:  A local agency on aging.  Your local United Way chapter.  Your state's department of health or social services. Summary  Hospice is a service that is designed to provide people who are terminally ill and their families with medical, spiritual, and psychological support.  Hospice aims to improve your quality of life by keeping you as comfortable as possible in the final stages of life.  Hospice teams often include a doctor, nurse, social worker, counselor, religious leader,dietitian, therapists, and volunteers.  Hospice care generally includes medicine for symptom management, visits from doctors and nurses, physical and occupational therapy, nutrition counseling, spiritual and emotional counseling, caregiver support, and bereavement support for grieving family members.  Hospice programs can take place in your home or at a hospice center, hospital, or skilled nursing facility. This information is not intended to replace advice given to you by your health care provider. Make sure you discuss any questions you have with your health care provider. Document Revised: 04/16/2019 Document Reviewed: 08/15/2016 Elsevier Patient Education  2020 Elsevier Inc.    Seizure, Adult A seizure is a sudden burst of abnormal electrical activity in the brain. Seizures usually last from 30 seconds to 2 minutes. The abnormal activity temporarily  interrupts normal brain function. A seizure can cause many different symptoms depending on where in the brain it starts. What are the causes? Common causes of this condition include:  Fever or infection.  Brain abnormality, injury, bleeding, or tumor.  Low blood sugar.  Metabolic disorders or other conditions that are passed from parent to child (are inherited).  Reaction to a substance, such as a drug or a medicine, or suddenly stopping the use of a substance (withdrawal).  Stroke.  Developmental disorders such as autism or cerebral palsy. In some cases, the cause of this condition may not be known. Some people who have a seizure never have another one. Seizures usually do not cause brain damage or permanent problems unless they are prolonged. A person who has repeated seizures over time without a clear cause has a condition called epilepsy. What increases the risk? You are more likely to develop this condition if you have:  A family history of epilepsy.  Had a tonic-clonic seizure in the past. This is a type of seizure that involves whole-body contraction of muscles and a loss of consciousness.  Autism, cerebral palsy, or other brain disorders.  A history of head trauma, lack of oxygen at birth, or strokes. What are the signs or symptoms? There are many different types of seizures. The symptoms of a seizure vary depending on the type of seizure you have. Examples of symptoms during a seizure   include:  Uncontrollable shaking (convulsions).  Stiffening of the body.  Loss of consciousness.  Head nodding.  Staring.  Not responding to sound or touch.  Loss of bladder or bowel control. Some people have symptoms right before a seizure happens (aura) and right after a seizure happens (postictal). Symptoms before a seizure may include:  Fear or anxiety.  Nausea.  Feeling like the room is spinning (vertigo).  A feeling of having seen or heard something before (dj  vu).  Odd tastes or smells.  Changes in vision, such as seeing flashing lights or spots. Symptoms after a seizure may include:  Confusion.  Sleepiness.  Headache.  Weakness on one side of the body. How is this diagnosed? This condition may be diagnosed based on:  A description of your symptoms. Video of your seizures can be helpful.  Your medical history.  A physical exam. You may also have tests, including:  Blood tests.  CT scan.  MRI.  Electroencephalogram (EEG). This test measures electrical activity in the brain. An EEG can predict whether seizures will return (recur).  A spinal tap (also called a lumbar puncture). This is the removal and testing of fluid that surrounds the brain and spinal cord. How is this treated? Most seizures will stop on their own in under 5 minutes, and no treatment is needed. Seizures that last longer than 5 minutes will usually need treatment. Treatment can include:  Medicines given through an IV.  Avoiding known triggers, such as medicines that you take for another condition.  Medicines to treat epilepsy (antiepileptics), if epilepsy caused your seizures.  Surgery to stop seizures, if you have epilepsy that does not respond to medicines. Follow these instructions at home: Medicines  Take over-the-counter and prescription medicines only as told by your health care provider.  Avoid any substances that may prevent your medicine from working properly, such as alcohol. Activity  Do not drive, swim, or do any other activities that would be dangerous if you had another seizure. Wait until your health care provider says it is safe to do them.  If you live in the [place] check with your local DMV (department of motor vehicles) to find out about local driving laws. Each state has specific rules about when you can legally return to driving.  Get enough rest. Lack of sleep can make seizures more likely to occur. Educating others Teach friends  and family what to do if you have a seizure. They should:  Lay you on the ground to prevent a fall.  Cushion your head and body.  Loosen any tight clothing around your neck.  Turn you on your side. If vomiting occurs, this helps keep your airway clear.  Not hold you down. Holding you down will not stop the seizure.  Not put anything into your mouth.  Know whether or not you need emergency care. For example, they should get help right away if you have a seizure that lasts longer than 5 minutes or have several seizures in a row.  Stay with you until you recover.  General instructions  Contact your health care provider each time you have a seizure.  Avoid anything that has ever triggered a seizure for you.  Keep a seizure diary. Record what you remember about each seizure, especially anything that might have triggered the seizure.  Keep all follow-up visits as told by your health care provider. This is important. Contact a health care provider if:  You have another seizure.  You have seizures   more often.  Your seizure symptoms change.  You continue to have seizures with treatment.  You have symptoms of an infection or illness. This might increase your risk of having a seizure. Get help right away if:  You have a seizure that: ? Lasts longer than 5 minutes. ? Is different than previous seizures. ? Leaves you unable to speak or use a part of your body. ? Makes it harder to breathe.  You have: ? A seizure after a head injury. ? Multiple seizures in a row. ? Confusion or a severe headache right after a seizure.  You do not wake up immediately after a seizure.  You injure yourself during a seizure. These symptoms may represent a serious problem that is an emergency. Do not wait to see if the symptoms will go away. Get medical help right away. Call your local emergency services (911 in the [place] Do not drive yourself to the hospital. Summary  Seizures are caused by  abnormal electrical activity in the brain. The activity disrupts normal brain function and can cause various symptoms, such as convulsions, abnormal movements, or a change in consciousness.  There are many causes of seizures, including illnesses, medicines, genetic conditions, head injuries, strokes, tumors, substance abuse, or substance withdrawal.  Most seizures will stop on their own in under 5 minutes. Seizures that last longer than 5 minutes are a medical emergency and require immediate treatment.  Many medicines are used to treat seizures. Take over-the-counter and prescription medicines only as told by your health care provider. This information is not intended to replace advice given to you by your health care provider. Make sure you discuss any questions you have with your health care provider. Document Revised: 10/11/2018 Document Reviewed: 10/11/2018 Elsevier Patient Education  [place].

## 2020-03-23 NOTE — Discharge Summary (Addendum)
DISCHARGE SUMMARY  Amy Randall  MR#: [id number]  DOB:[birth year]  Date of Admission: 03/19/2020 Date of Discharge: 03/23/2020  Attending Physician:Anapaola Kinsel Silvestre Gunner, MD  Patient's HCW:[id number], Ramon Dredge, MD  Consults: none  Disposition: D/C to prior memory care unit    Follow-up Appts:   Tests Needing Follow-up: -all care is to focus on comfort only - no further testing is appropriate or indicated   Discharge Diagnoses: Seizure - postictal encephalopathy Dementia Severe Transaminitis Acute kidney injury Hyperkalemia Recurrent falls HTN Normocytic anemia NO CODE BLUE - DNR   Initial presentation: 86 year old SNF resident with a history of dementia and HTN who presented after 2 falls in her facility followed by unresponsiveness. She was felt to have had an observed seizure. Upon arrival via EMS she was observed to have a seizure in the ED. She was dosed with Ativan and Keppra. She was noted to be on hospice care and a DNR  Hospital Course:  Seizure - postictal acute encephalopathy CT head without acute findings apart from a left scalp hematoma - no prior history of seizure activity - difficult to tell if the patient's falls were related to seizures or if repetitive trauma to the left frontal lobe helped precipitate the seizures - low-dose Keppra utilized initially but then discontinued during the latter portion of hospital stay -discontinued Exelon due to potential to cause sedation and lower seizure threshold -mental status slow to improve but on 8/17 the patient's daughter reported that she had return essentially to her baseline -it was not felt that further work-up would likely benefit the patient or add to her quality of life -the family strongly desired that she be allowed to return to her previous memory care unit and it was felt that she was medically stable to do so with the focus being on comfort care  Dementia Evidence of such confirmed on CT head - at baseline  she can speak but does not know who her daughter is per records -on the date of discharge the patient's daughter confirms that she appears to be back at her baseline  Severe Transaminitis Etiology unclear -?shock liver - acute hepatitis panel unrevealing -right upper quadrant ultrasound did not reveal a clear etiology -this was discussed at length with the patient's daughter -at this point it is not felt that further investigation into this issue will likely add to the patient's quality of life in any meaningful way and therefore no further testing is planned  Acute kidney injury Felt to be primarily prerenal in etiology due to poor intake -based upon goals of care there is no indication to follow this further  Hyperkalemia Modest -likely related to worsening renal function -slightly improved on day of discharge  Recurrent falls Will need supportive environment and 24hr supervision at d/c   HTN Strict BP control not indicated at this time   Normocytic anemia Likely due to poor nutritional state   Disposition / goals of care It was noted that the patient is followed by hospice at her memory care unit -the patient's daughter is an RN who has a good grasp of her mother's current medical state -hospice care is to continue with the focus being on providing for the highest quality of life possible for the patient and minimizing further troublesome interventions    Allergies as of 03/23/2020      Reactions   Aricept [donepezil] Other (See Comments)   Unknown reaction - listed on [facility] 03/19/2020   Bactrim [sulfamethoxazole-trimethoprim] Other (See Comments)   Unknown  reaction - listed on [facility] 03/19/2020   Citrus Other (See Comments)   Unknown reaction - listed on [facility] 03/19/2020   Ibuprofen Other (See Comments)   Caused bruising   Penicillins Other (See Comments)   Unknown reaction - listed on [facility] 03/19/2020   Sulfa Antibiotics Other (See Comments)   "Loss of memory," per other chart in  Epic   Cefepime Rash, Other (See Comments)   "Possible rash developed after cefepime dose administered on pt's abdomen," per patient's other chart in Epic      Medication List    STOP taking these medications   aspirin EC 81 MG tablet   diltiazem 360 MG 24 hr capsule Commonly known as: CARDIZEM CD   methimazole 5 MG tablet Commonly known as: TAPAZOLE   rivastigmine 13.3 MG/24HR Commonly known as: EXELON     TAKE these medications   acetaminophen 325 MG tablet Commonly known as: TYLENOL Take 650 mg by mouth 3 (three) times daily.   Ensure Take 237 mLs by mouth 3 (three) times daily.   metoprolol tartrate 25 MG tablet Commonly known as: LOPRESSOR Take 25 mg by mouth 2 (two) times daily.   morphine 20 MG/ML concentrated solution Commonly known as: ROXANOL Take 5 mg by mouth 3 (three) times daily.       Day of Discharge BP (!) 159/125   Pulse (!) 119   Temp 97.8 F (36.6 C)   Resp 16   Ht 5' (1.524 m)   Wt 54.4 kg   SpO2 98%   BMI 23.44 kg/m   Physical Exam: General: No acute respiratory distress Lungs: Clear to auscultation bilaterally  Cardiovascular: reg rate w/o M or rub  Abdomen: nondistended - soft - bs+ but hypoactive  Extremities: no signif edema B LE   Basic Metabolic Panel: Recent Labs  Lab 03/19/20 1927 03/20/20 0029 03/22/20 0206 03/23/20 0052  NA 136 137 143 143  K 4.3 3.5 6.0* 5.4*  CL 101 104 113* 114*  CO2 23 25 18* 19*  GLUCOSE 142* 80 137* 127*  BUN 20 19 22  32*  CREATININE 0.87 0.88 1.02* 1.17*  CALCIUM 8.7* 8.3* 8.7* 8.8*    Liver Function Tests: Recent Labs  Lab 03/19/20 1927 03/20/20 0029 03/22/20 0206 03/23/20 0052  AST 20 15 450* 2,060*  ALT 15 12 261* 1,218*  ALKPHOS 140* 120 211* 209*  BILITOT 0.7 0.6 2.7* 2.0*  PROT 5.8* 5.2* 5.7* 5.8*  ALBUMIN 3.0* 2.6* 2.8* 2.9*    Recent Labs  Lab 03/23/20 1159  AMMONIA 35    Coags: Recent Labs  Lab 03/19/20 1927  INR 1.2   CBC: Recent Labs  Lab  03/19/20 1927 03/20/20 0029 03/22/20 0206  WBC 11.4* 7.4 9.0  HGB 10.6* 9.9* 11.5*  HCT 35.2* 33.3* 39.9  MCV 87.3 88.3 89.3  PLT 392 247 324    Recent Results (from the past 240 hour(s))  SARS Coronavirus 2 by RT PCR (hospital order, performed in [facility] hospital lab) Nasopharyngeal Nasopharyngeal Swab     Status: None   Collection Time: 03/19/20  7:20 PM   Specimen: Nasopharyngeal Swab  Result Value Ref Range Status   SARS Coronavirus 2 NEGATIVE NEGATIVE Final    Comment: QA FLAGS AND/OR RANGES MODIFIED BY DEMOGRAPHIC UPDATE ON 08/14 AT 0014 QA FLAGS AND/OR RANGES MODIFIED BY DEMOGRAPHIC UPDATE ON 08/14 AT 9/14 QA FLAGS AND/OR RANGES MODIFIED BY DEMOGRAPHIC UPDATE ON 08/15 AT 1837 QA FLAGS AND/OR RANGES MODIFIED BY DEMOGRAPHIC UPDATE ON 08/15  AT 1956 (NOTE) SARS-CoV-2 target nucleic acids are NOT DETECTED.  The SARS-CoV-2 RNA is generally detectable in upper and lower respiratory specimens during the acute phase of infection. The lowest concentration of SARS-CoV-2 viral copies this assay can detect is 250 copies / mL. A negative result does not preclude SARS-CoV-2 infection and should not be used as the sole basis for treatment or other patient management decisions.  A negative result may occur with improper specimen collection / handling, submission of specimen other than nasopharyngeal swab, presence of viral mutation(s) within the areas targeted by this assay, and inadequate number of viral copies (<250 copies / mL). A negative result  must be combined with clinical observations, patient history, and epidemiological information.  Fact Sheet for Patients:   BoilerBrush.com.cy  Fact Sheet for Healthcare Providers: https://pope.com/  This test is not yet approved or cleared by the [place] FDA and has been authorized for detection and/or diagnosis of SARS-CoV-2 by FDA under an Emergency Use Authorization (EUA).   This EUA will remain in effect (meaning this test can be used) for the duration of the COVID-19 declaration under Section 564(b)(1) of the Act, 21 U.S.C. section 360bbb-3(b)(1), unless the authorization is terminated or revoked sooner.  Performed at [facility], [address], [place], [place] [address]       Time spent in discharge (includes decision making & examination of pt): 35 minutes  03/23/2020, 3:18 PM   Lonia Blood, MD [facility] Office  [phone]

## 2020-03-24 ENCOUNTER — Encounter (HOSPITAL_COMMUNITY): Payer: Self-pay

## 2020-10-05 DEATH — deceased
